# Patient Record
Sex: Female | Born: 1945 | Race: White | Hispanic: No | Marital: Married | Smoking: Never smoker
Health system: Southern US, Community
[De-identification: ages and names within clinical notes are randomized; demographics above are authoritative.]

## PROBLEM LIST (undated history)

## (undated) DIAGNOSIS — G4730 Sleep apnea, unspecified: Secondary | ICD-10-CM

## (undated) DIAGNOSIS — J90 Pleural effusion, not elsewhere classified: Secondary | ICD-10-CM

## (undated) DIAGNOSIS — C801 Malignant (primary) neoplasm, unspecified: Secondary | ICD-10-CM

## (undated) DIAGNOSIS — I341 Nonrheumatic mitral (valve) prolapse: Secondary | ICD-10-CM

## (undated) DIAGNOSIS — I3139 Other pericardial effusion (noninflammatory): Secondary | ICD-10-CM

## (undated) DIAGNOSIS — T4145XA Adverse effect of unspecified anesthetic, initial encounter: Secondary | ICD-10-CM

## (undated) DIAGNOSIS — D649 Anemia, unspecified: Secondary | ICD-10-CM

## (undated) DIAGNOSIS — N390 Urinary tract infection, site not specified: Secondary | ICD-10-CM

## (undated) DIAGNOSIS — M779 Enthesopathy, unspecified: Secondary | ICD-10-CM

## (undated) DIAGNOSIS — R011 Cardiac murmur, unspecified: Secondary | ICD-10-CM

## (undated) DIAGNOSIS — L0390 Cellulitis, unspecified: Secondary | ICD-10-CM

## (undated) DIAGNOSIS — R7303 Prediabetes: Secondary | ICD-10-CM

## (undated) DIAGNOSIS — Z9889 Other specified postprocedural states: Secondary | ICD-10-CM

## (undated) DIAGNOSIS — R112 Nausea with vomiting, unspecified: Secondary | ICD-10-CM

## (undated) DIAGNOSIS — I2699 Other pulmonary embolism without acute cor pulmonale: Secondary | ICD-10-CM

## (undated) DIAGNOSIS — E538 Deficiency of other specified B group vitamins: Secondary | ICD-10-CM

## (undated) DIAGNOSIS — M1990 Unspecified osteoarthritis, unspecified site: Secondary | ICD-10-CM

## (undated) DIAGNOSIS — M1711 Unilateral primary osteoarthritis, right knee: Secondary | ICD-10-CM

## (undated) DIAGNOSIS — I6523 Occlusion and stenosis of bilateral carotid arteries: Secondary | ICD-10-CM

## (undated) DIAGNOSIS — T8859XA Other complications of anesthesia, initial encounter: Secondary | ICD-10-CM

## (undated) DIAGNOSIS — E041 Nontoxic single thyroid nodule: Secondary | ICD-10-CM

## (undated) DIAGNOSIS — I340 Nonrheumatic mitral (valve) insufficiency: Secondary | ICD-10-CM

## (undated) HISTORY — PX: TONSILLECTOMY: SUR1361

## (undated) HISTORY — DX: Other pulmonary embolism without acute cor pulmonale: I26.99

## (undated) HISTORY — PX: FRACTURE SURGERY: SHX138

## (undated) HISTORY — PX: KNEE SURGERY: SHX244

## (undated) HISTORY — PX: APPENDECTOMY: SHX54

## (undated) HISTORY — PX: COLONOSCOPY: SHX174

## (undated) HISTORY — PX: ABDOMINAL HYSTERECTOMY: SHX81

---

## 2015-06-08 ENCOUNTER — Ambulatory Visit
Admission: EM | Admit: 2015-06-08 | Discharge: 2015-06-08 | Disposition: A | Discharge status: Home / Self Care | Payer: Medicare Other | Attending: Family Medicine | Admitting: Family Medicine

## 2015-06-08 DIAGNOSIS — B86 Scabies: Secondary | ICD-10-CM | POA: Diagnosis not present

## 2015-06-08 DIAGNOSIS — R21 Rash and other nonspecific skin eruption: Secondary | ICD-10-CM | POA: Diagnosis not present

## 2015-06-08 MED ORDER — PERMETHRIN 5 % EX CREA: 1.0000 "application " | TOPICAL_CREAM | Freq: Once | CUTANEOUS | Status: DC

## 2015-06-08 MED ORDER — HYDROXYZINE HCL 25 MG PO TABS: 25.0000 mg | ORAL_TABLET | Freq: Three times a day (TID) | ORAL | Status: DC | PRN

## 2015-06-08 NOTE — ED Provider Notes (Signed)
[facility] Emergency Department Provider Note  ____________________________________________  Time seen: Approximately 2:01 PM  I have reviewed the triage vital signs and the nursing notes.   HISTORY  Chief Complaint Rash    HPI Dana Bautista is a 69 y.o. female presents for itchy rash. States present x 4-5 days. States no changes in foods, medications, lotions, detergents or other changes. States she and husband just moved to new home prior to rash onset. 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 States husband now with same. States rash is itchy. Denies pain. States itches worse at night. States rash started on torso and spread. States now even with rash and itching between fingers. Denies seeing insects. Denies seeing bedbugs. States does have dogs.   Denies other changes. Denies facial or inside mouth swelling. Reports continues to eat and drink well.    Past Medical History  Diagnosis Date  . Diabetes mellitus without complication     There are no active problems to display for this patient.   History reviewed. No pertinent past surgical history.  Current Outpatient Rx  Name  Route  Sig  Dispense  Refill  . empagliflozin (JARDIANCE) 10 MG TABS tablet   Oral   Take 10 mg by mouth daily.           Allergies Codeine; Levaquin; Penicillins; and Sulfur  No family history on file.  Social History Social History  Substance Use Topics  . Smoking status: Never Smoker   . Smokeless tobacco: None  . Alcohol Use: No    Review of Systems Constitutional: No fever/chills Eyes: No visual changes. ENT: No sore throat. Cardiovascular: Denies chest pain. Respiratory: Denies shortness of breath. Gastrointestinal: No abdominal pain.  No nausea, no vomiting.  No diarrhea.  No constipation. Genitourinary: Negative for dysuria. Musculoskeletal: Negative for back pain. Skin: positive for rash. Neurological: Negative for headaches, focal weakness or  numbness.  10-point ROS otherwise negative.  ____________________________________________   PHYSICAL EXAM:  VITAL SIGNS: ED Triage Vitals  Enc Vitals Group     BP 06/08/15 1333 120/57 mmHg     Pulse Rate 06/08/15 1333 70     Resp 06/08/15 1333 18     Temp 06/08/15 1333 98.5 F (36.9 C)     Temp Source 06/08/15 1333 Tympanic     SpO2 06/08/15 1333 99 %     Weight 06/08/15 1333 230 lb (104.327 kg)     Height 06/08/15 1333 5\' 6"  (1.676 m)     Head Cir --      Peak Flow --      Pain Score --      Pain Loc --      Pain Edu? --      Excl. in [place]? --     Constitutional: Alert and oriented. Well appearing and in no acute distress. Eyes: Conjunctivae are normal. PERRL. EOMI. Head: Atraumatic.  Nose: No congestion/rhinnorhea.  Mouth/Throat: Mucous membranes are moist.  Oropharynx non-erythematous. No facial or oropharynx swelling.  Neck: No stridor.  No cervical spine tenderness to palpation. Hematological/Lymphatic/Immunilogical: No cervical lymphadenopathy. Cardiovascular: Normal rate, regular rhythm. Grossly normal heart sounds.  Good peripheral circulation. Respiratory: Normal respiratory effort.  No retractions. Lungs CTAB. Gastrointestinal: Soft and nontender. No distention. Normal Bowel sounds.  No abdominal bruits. No CVA tenderness. Musculoskeletal: No lower or upper extremity tenderness nor edema.  No joint effusions. Bilateral pedal pulses equal and easily palpated.  Neurologic:  Normal speech and language. No gross focal neurologic deficits are  appreciated. No gait instability. Skin:  Skin is warm, dry and intact. Generalized pruritic erythematic erythematic base rash with excoriation, worse to torso, not on face, palms of hands or plantar feet. No surrounding erythema, induration or fluctuance. No swelling or edema.  Psychiatric: Mood and affect are normal. Speech and behavior are normal.  ____________________________________________   LABS (all labs ordered are  listed, but only abnormal results are displayed)  Labs Reviewed - No data to display  ____________________________________________   INITIAL IMPRESSION / ASSESSMENT AND PLAN / ED COURSE  Pertinent labs & imaging results that were available during my care of the patient were reviewed by me and considered in my medical decision making (see chart for details).  Presents for generalized pruritic rash with appearance consistent with scabies. Husband with similar. Counseled regarding cleaning and cleaning clothes and bedding. Will treat with oral permethrin and prn hydroxyzine. Discussed follow up and return parameters. Patient verbalized understanding and agreed to plan.  ____________________________________________   FINAL CLINICAL IMPRESSION(S) / ED DIAGNOSES  Final diagnoses:  Scabies  Rash       Marylene Land, NP 06/08/15 1420

## 2015-06-08 NOTE — Discharge Instructions (Signed)
Take medication as prescribed. Avoid scratching.   Follow up with your primary care physician as needed. Return to Urgent care for new or worsening concerns.   Scabies Scabies are small bugs (mites) that burrow under the skin and cause red bumps and severe itching. These bugs can only be seen with a microscope. Scabies are highly contagious. They can spread easily from person to person by direct contact. They are also spread through sharing clothing or linens that have the scabies mites living in them. It is not unusual for an entire family to become infected through shared towels, clothing, or bedding.  HOME CARE INSTRUCTIONS   Your caregiver may prescribe a cream or lotion to kill the mites. If cream is prescribed, massage the cream into the entire body from the neck to the bottom of both feet. Also massage the cream into the scalp and face if your child is less than 79 year old. Avoid the eyes and mouth. Do not wash your hands after application.  Leave the cream on for 8 to 12 hours. Your child should bathe or shower after the 8 to 12 hour application period. Sometimes it is helpful to apply the cream to your child right before bedtime.  One treatment is usually effective and will eliminate approximately 95% of infestations. For severe cases, your caregiver may decide to repeat the treatment in 1 week. Everyone in your household should be treated with one application of the cream.  New rashes or burrows should not appear within 24 to 48 hours after successful treatment. However, the itching and rash may last for 2 to 4 weeks after successful treatment. Your caregiver may prescribe a medicine to help with the itching or to help the rash go away more quickly.  Scabies can live on clothing or linens for up to 3 days. All of your child's recently used clothing, towels, stuffed toys, and bed linens should be washed in hot water and then dried in a dryer for at least 20 minutes on high heat. Items that  cannot be washed should be enclosed in a plastic bag for at least 3 days.  To help relieve itching, bathe your child in a cool bath or apply cool washcloths to the affected areas.  Your child may return to school after treatment with the prescribed cream. SEEK MEDICAL CARE IF:   The itching persists longer than 4 weeks after treatment.  The rash spreads or becomes infected. Signs of infection include red blisters or yellow-tan crust. Document Released: 09/12/2005 Document Revised: 12/05/2011 Document Reviewed: 01/21/2009 [facility] Patient Information 2015 [place], [place]. This information is not intended to replace advice given to you by your health care provider. Make sure you discuss any questions you have with your health care provider.

## 2015-06-08 NOTE — ED Notes (Signed)
Pt states "I have a rash all over my body, we are moving and I am unsure if we got bedbugs from the moving company. My husband has the rash coming up as well."

## 2015-06-25 ENCOUNTER — Ambulatory Visit
Admission: EM | Admit: 2015-06-25 | Discharge: 2015-06-25 | Disposition: A | Discharge status: Home / Self Care | Payer: Medicare Other | Attending: Family Medicine | Admitting: Family Medicine

## 2015-06-25 DIAGNOSIS — R300 Dysuria: Secondary | ICD-10-CM | POA: Diagnosis present

## 2015-06-25 DIAGNOSIS — E119 Type 2 diabetes mellitus without complications: Secondary | ICD-10-CM | POA: Insufficient documentation

## 2015-06-25 DIAGNOSIS — Z79899 Other long term (current) drug therapy: Secondary | ICD-10-CM | POA: Insufficient documentation

## 2015-06-25 DIAGNOSIS — N390 Urinary tract infection, site not specified: Secondary | ICD-10-CM

## 2015-06-25 DIAGNOSIS — R51 Headache: Secondary | ICD-10-CM | POA: Diagnosis present

## 2015-06-25 DIAGNOSIS — B852 Pediculosis, unspecified: Secondary | ICD-10-CM

## 2015-06-25 LAB — URINALYSIS COMPLETE WITH MICROSCOPIC (ARMC ONLY)
BILIRUBIN URINE: NEGATIVE
Glucose, UA: 500 mg/dL — AB
Ketones, ur: NEGATIVE mg/dL
NITRITE: POSITIVE — AB
PH: 5.5 (ref 5.0–8.0)
Protein, ur: NEGATIVE mg/dL
SPECIFIC GRAVITY, URINE: 1.015 (ref 1.005–1.030)

## 2015-06-25 MED ORDER — FLUCONAZOLE 150 MG PO TABS: 150.0000 mg | ORAL_TABLET | Freq: Once | ORAL | Status: DC

## 2015-06-25 MED ORDER — RANITIDINE HCL 150 MG PO CAPS: 150.0000 mg | ORAL_CAPSULE | Freq: Two times a day (BID) | ORAL | Status: DC

## 2015-06-25 MED ORDER — PHENAZOPYRIDINE HCL 200 MG PO TABS: 200.0000 mg | ORAL_TABLET | Freq: Three times a day (TID) | ORAL | Status: DC | PRN

## 2015-06-25 MED ORDER — PERMETHRIN 5 % EX CREA: 1.0000 "application " | TOPICAL_CREAM | Freq: Once | CUTANEOUS | Status: DC

## 2015-06-25 MED ORDER — LORATADINE 10 MG PO TABS: 10.0000 mg | ORAL_TABLET | Freq: Every day | ORAL | Status: DC

## 2015-06-25 MED ORDER — CIPROFLOXACIN HCL 500 MG PO TABS: 500.0000 mg | ORAL_TABLET | Freq: Two times a day (BID) | ORAL | Status: DC

## 2015-06-25 NOTE — ED Provider Notes (Signed)
CSN: [id number]     Arrival date & time 06/25/15  1658 History   First MD Initiated Contact with Patient 06/25/15 1804     Chief Complaint  Patient presents with  . Urinary Tract Infection  . Rash   problem 1 UTI: she reports burning on urination frequency she's had some recurrent urinary tract infections but not lately. She is found.hydration is a helps but she is recently moved. She states that initially for UTI she took some clindamycin but after Labor Day that seemed to help but then she has to stop after 3 days of clindamycin gave a yeast infection and the UTI symptoms have returned. Reports low abdominal pain as well.    problem 2 pediculosis infection: She was seen here a few weeks ago by nurse practitioner Sabra Heck. She was given the Elimite lotion but apparently did not convince her husband to take the lotion like she did. He is said child to use it on a when necessary basis and where he had lesions. Also as she points out she's not a small woman and that she didn't have enough lotion for her and her husband. Expect I will give a new prescription for the lotion that her husband is seen and he is here to be seen.    (Consider location/radiation/quality/duration/timing/severity/associated sxs/prior Treatment) Patient is a 69 y.o. female presenting with urinary tract infection and rash. The history is provided by the patient. No language interpreter was used.  Urinary Tract Infection Pain quality:  Sharp, burning and shooting Pain severity:  Moderate Duration:  3 weeks Timing:  Constant (After about for 5 days she took 3 days of clindamycin she had left over from some dental work. That seemed to help but was only 3 days and since then is come back. She states is to be some infection as clindamycin) Progression:  Worsening Chronicity:  New Relieved by:  Nothing Ineffective treatments:  Antibiotics (Clindamycin) Urinary symptoms: frequent urination   Associated symptoms: abdominal pain     Associated symptoms: no fever and no flank pain   Risk factors: recurrent urinary tract infections   Rash Location:  Head/neck and shoulder/arm Head/neck rash location:  L neck and R neck Shoulder/arm rash location:  L arm and R arm Quality: itchiness   Severity:  Moderate Timing:  Constant Progression:  Waxing and waning Chronicity:  Recurrent Relieved by:  Antihistamines Associated symptoms: abdominal pain   Associated symptoms: no fever     Past Medical History  Diagnosis Date  . Diabetes mellitus without complication    No past surgical history on file. No family history on file. Social History  Substance Use Topics  . Smoking status: Never Smoker   . Smokeless tobacco: Never Used  . Alcohol Use: No   OB History    No data available     Review of Systems  Constitutional: Negative for fever.  Gastrointestinal: Positive for abdominal pain.  Genitourinary: Positive for dysuria, frequency and decreased urine volume. Negative for flank pain.  Skin: Positive for rash.    Allergies  Codeine; Levaquin; Penicillins; and Sulfur  Home Medications   Prior to Admission medications   Medication Sig Start Date End Date Taking? Authorizing Provider  empagliflozin (JARDIANCE) 10 MG TABS tablet Take 10 mg by mouth daily.   Yes Historical Provider, MD  hydrOXYzine (ATARAX/VISTARIL) 25 MG tablet Take 1 tablet (25 mg total) by mouth 3 (three) times daily as needed for itching. 06/08/15  Yes Marylene Land, NP  permethrin Nancy Fetter)  5 % cream Apply 1 application topically once. Apply to entire body topically once and leave on for 8-14 hours then wash off. Repeat in 1 week. Rx: 2 doses 06/08/15  Yes Marylene Land, NP  VITAMIN D, CHOLECALCIFEROL, PO Take by mouth.   Yes Historical Provider, MD   Meds Ordered and Administered this Visit  Medications - No data to display  BP 98/77 mmHg  Pulse 72  Temp(Src) 97.9 F (36.6 C) (Tympanic)  Resp 16  Ht 5' 6.5" (1.689 m)  Wt 220 lb  (99.791 kg)  BMI 34.98 kg/m2  SpO2 96% No data found.   Physical Exam  Constitutional: She is oriented to person, place, and time. She appears well-developed and well-nourished.  Obese white female  HENT:  Head: Normocephalic and atraumatic.  Eyes: Pupils are equal, round, and reactive to light.  Abdominal: Soft. Bowel sounds are normal. She exhibits no distension.  Tenderness suprapubically  Musculoskeletal: Normal range of motion. She exhibits no edema.  Neurological: She is alert and oriented to person, place, and time.  Skin: Rash noted.     Patient was pearly covered with a rash before she used the Elimite lotion. However she states her husband is only been dabbing the lotion on him know his lesion which is typed explained to her that I can be the way to keep infections from occurring.  Psychiatric: She has a normal mood and affect. Her behavior is normal.  Vitals reviewed.   ED Course  Procedures (including critical care time)  Labs Review Labs Reviewed  URINALYSIS COMPLETEWITH MICROSCOPIC ([facility] ONLY) - Abnormal; Notable for the following:    APPearance HAZY (*)    Glucose, UA 500 (*)    Hgb urine dipstick 1+ (*)    Nitrite POSITIVE (*)    Leukocytes, UA 1+ (*)    Bacteria, UA MANY (*)    Squamous Epithelial / LPF 0-5 (*)    All other components within normal limits  URINE CULTURE    Imaging Review No results found.   Visual Acuity Review  Right Eye Distance:   Left Eye Distance:   Bilateral Distance:    Right Eye Near:   Left Eye Near:    Bilateral Near:         MDM  No diagnosis found.    #1 UTI: We'll place on Cipro for at least a week, she states that she is allergic to Levaquin and multiple anabiotic's but she can't take Cipro, Pyridium 200 mg 3 times a day for the bladder discomfort, and Diflucan to prevent yeast infections follow-up with her PCP of her choice in 2-3 weeks to prove care  #2 patient is here to recheck her pediculosis  infection. I have personally gone over the hygienic way of using the Elimite lotion. Washing bed linen washing her clothes placing it from the neck down and stressed to her that she has to have a husband treat same time and do the same hygienic purposes otherwise is not going to work. The clothes she cannot wash she is not wear for 2-3 days. We'll place on Zantac twice a day and Claritin 10 mg for itching.   Frederich Cha, MD 06/25/15 [phone]

## 2015-06-25 NOTE — ED Notes (Signed)
Pt was seen here about 2 weeks ago for scabies. Pt reports she is 50% improved, but not resolved. Pt also c/o dysuria. Pt has a h/o recurrent UTI's.

## 2015-06-25 NOTE — Discharge Instructions (Signed)
Head and Pubic Lice Lice are tiny, light brown insects with claws on the ends of their legs. They are small parasites that live on the human body. Lice often make their home in your hair. They hatch from little round eggs (nits), which are attached to the base of hairs. They spread by:  Direct contact with an infested person.  Infested personal items such as combs, brushes, towels, clothing, pillow cases and sheets. The parasite that causes your condition may also live in clothes which have been worn within the week before treatment. Therefore, it is necessary to wash your clothes, bed linens, towels, combs and brushes. Any woolens can be put in an air-tight plastic bag for one week. You need to use fresh clothes, towels and sheets after your treatment is completed. Re-treatment is usually not necessary if instructions are followed. If necessary, treatment may be repeated in 7 days. The entire family may require treatment. Sexual partners should be treated if the nits are present in the pubic area. TREATMENT  Apply enough medicated shampoo or cream to wet hair and skin in and around the infected areas.  Work thoroughly into hair and leave in according to instructions.  Add a small amount of water until a good lather forms.  Rinse thoroughly.  Towel briskly.  When hair is dry, any remaining nits, cream or shampoo may be removed with a fine-tooth comb or tweezers. The nits resemble dandruff; however they are glued to the hair follicle and are difficult to brush out. Frequent fine combing and shampoos are necessary. A towel soaked in white vinegar and left on the hair for 2 hours will also help soften the glue which holds the nits on the hair. Medicated shampoo or cream should not be used on children or pregnant women without a caregiver's prescription or instructions. SEEK MEDICAL CARE IF:   You or your child develops sores that look infected.  The rash does not go away in one week.  The  lice or nits return or persist in spite of treatment. Document Released: 09/12/2005 Document Revised: 12/05/2011 Document Reviewed: 04/11/2007 [facility] Patient Information 2015 [place], [place]. This information is not intended to replace advice given to you by your health care provider. Make sure you discuss any questions you have with your health care provider.  Urinary Tract Infection A urinary tract infection (UTI) can occur any place along the urinary tract. The tract includes the kidneys, ureters, bladder, and urethra. A type of germ called bacteria often causes a UTI. UTIs are often helped with antibiotic medicine.  HOME CARE   If given, take antibiotics as told by your doctor. Finish them even if you start to feel better.  Drink enough fluids to keep your pee (urine) clear or pale yellow.  Avoid tea, drinks with caffeine, and bubbly (carbonated) drinks.  Pee often. Avoid holding your pee in for a long time.  Pee before and after having sex (intercourse).  Wipe from front to back after you poop (bowel movement) if you are a woman. Use each tissue only once. GET HELP RIGHT AWAY IF:   You have back pain.  You have lower belly (abdominal) pain.  You have chills.  You feel sick to your stomach (nauseous).  You throw up (vomit).  Your burning or discomfort with peeing does not go away.  You have a fever.  Your symptoms are not better in 3 days. MAKE SURE YOU:   Understand these instructions.  Will watch your condition.  Will  get help right away if you are not doing well or get worse. Document Released: 02/29/2008 Document Revised: 06/06/2012 Document Reviewed: 04/12/2012 [facility] Patient Information 2015 [place], [place]. This information is not intended to replace advice given to you by your health care provider. Make sure you discuss any questions you have with your health care provider.

## 2015-06-27 LAB — URINE CULTURE: Culture: >=100000

## 2015-08-26 ENCOUNTER — Emergency Department: Payer: Medicare Other

## 2015-08-26 ENCOUNTER — Emergency Department
Admission: EM | Admit: 2015-08-26 | Discharge: 2015-08-26 | Disposition: A | Discharge status: Home / Self Care | Payer: Medicare Other | Attending: Emergency Medicine | Admitting: Emergency Medicine

## 2015-08-26 ENCOUNTER — Encounter: Payer: Self-pay | Admitting: Emergency Medicine

## 2015-08-26 DIAGNOSIS — E119 Type 2 diabetes mellitus without complications: Secondary | ICD-10-CM | POA: Insufficient documentation

## 2015-08-26 DIAGNOSIS — R079 Chest pain, unspecified: Secondary | ICD-10-CM | POA: Insufficient documentation

## 2015-08-26 DIAGNOSIS — Z880 Allergy status to penicillin: Secondary | ICD-10-CM | POA: Diagnosis not present

## 2015-08-26 HISTORY — DX: Nonrheumatic mitral (valve) insufficiency: I34.0

## 2015-08-26 LAB — BASIC METABOLIC PANEL
Anion gap: 7 (ref 5–15)
BUN: 19 mg/dL (ref 6–20)
CHLORIDE: 105 mmol/L (ref 101–111)
CO2: 28 mmol/L (ref 22–32)
Calcium: 9.4 mg/dL (ref 8.9–10.3)
Creatinine, Ser: 0.96 mg/dL (ref 0.44–1.00)
GFR calc non Af Amer: >60 mL/min (ref >60)
Glucose, Bld: 100 mg/dL — ABNORMAL HIGH (ref 65–99)
POTASSIUM: 3.8 mmol/L (ref 3.5–5.1)
SODIUM: 140 mmol/L (ref 135–145)

## 2015-08-26 LAB — CBC
HEMATOCRIT: 36.8 % (ref 35.0–47.0)
Hemoglobin: 12 g/dL (ref 12.0–16.0)
MCH: 27.3 pg (ref 26.0–34.0)
MCHC: 32.5 g/dL (ref 32.0–36.0)
MCV: 84 fL (ref 80.0–100.0)
Platelets: 271 10*3/uL (ref 150–440)
RBC: 4.39 MIL/uL (ref 3.80–5.20)
RDW: 14.4 % (ref 11.5–14.5)
WBC: 8.5 10*3/uL (ref 3.6–11.0)

## 2015-08-26 LAB — TROPONIN I: Troponin I: <0.03 ng/mL (ref <0.031)

## 2015-08-26 MED ORDER — ASPIRIN 81 MG PO CHEW: 81.0000 mg | CHEWABLE_TABLET | Freq: Every day | ORAL | Status: AC

## 2015-08-26 MED ORDER — ASPIRIN 81 MG PO CHEW
324.0000 mg | CHEWABLE_TABLET | Freq: Once | ORAL | Status: AC
  Administered 2015-08-26: 324 mg via ORAL
  Filled 2015-08-26: qty 4

## 2015-08-26 NOTE — Discharge Instructions (Signed)
Nonspecific Chest Pain  °Chest pain can be caused by many different conditions. There is always a chance that your pain could be related to something serious, such as a heart attack or a blood clot in your lungs. Chest pain can also be caused by conditions that are not life-threatening. If you have chest pain, it is very important to follow up with your health care provider. °CAUSES  °Chest pain can be caused by: °· Heartburn. °· Pneumonia or bronchitis. °· Anxiety or stress. °· Inflammation around your heart (pericarditis) or lung (pleuritis or pleurisy). °· A blood clot in your lung. °· A collapsed lung (pneumothorax). It can develop suddenly on its own (spontaneous pneumothorax) or from trauma to the chest. °· Shingles infection (varicella-zoster virus). °· Heart attack. °· Damage to the bones, muscles, and cartilage that make up your chest wall. This can include: °¨ Bruised bones due to injury. °¨ Strained muscles or cartilage due to frequent or repeated coughing or overwork. °¨ Fracture to one or more ribs. °¨ Sore cartilage due to inflammation (costochondritis). °RISK FACTORS  °Risk factors for chest pain may include: °· Activities that increase your risk for trauma or injury to your chest. °· Respiratory infections or conditions that cause frequent coughing. °· Medical conditions or overeating that can cause heartburn. °· Heart disease or family history of heart disease. °· Conditions or health behaviors that increase your risk of developing a blood clot. °· Having had chicken pox (varicella zoster). °SIGNS AND SYMPTOMS °Chest pain can feel like: °· Burning or tingling on the surface of your chest or deep in your chest. °· Crushing, pressure, aching, or squeezing pain. °· Dull or sharp pain that is worse when you move, cough, or take a deep breath. °· Pain that is also felt in your back, neck, shoulder, or arm, or pain that spreads to any of these areas. °Your chest pain may come and go, or it may stay  constant. °DIAGNOSIS °Lab tests or other studies may be needed to find the cause of your pain. Your health care provider may have you take a test called an ambulatory ECG (electrocardiogram). An ECG records your heartbeat patterns at the time the test is performed. You may also have other tests, such as: °· Transthoracic echocardiogram (TTE). During echocardiography, sound waves are used to create a picture of all of the heart structures and to look at how blood flows through your heart. °· Transesophageal echocardiogram (TEE). This is a more advanced imaging test that obtains images from inside your body. It allows your health care provider to see your heart in finer detail. °· Cardiac monitoring. This allows your health care provider to monitor your heart rate and rhythm in real time. °· Holter monitor. This is a portable device that records your heartbeat and can help to diagnose abnormal heartbeats. It allows your health care provider to track your heart activity for several days, if needed. °· Stress tests. These can be done through exercise or by taking medicine that makes your heart beat more quickly. °· Blood tests. °· Imaging tests. °TREATMENT  °Your treatment depends on what is causing your chest pain. Treatment may include: °· Medicines. These may include: °¨ Acid blockers for heartburn. °¨ Anti-inflammatory medicine. °¨ Pain medicine for inflammatory conditions. °¨ Antibiotic medicine, if an infection is present. °¨ Medicines to dissolve blood clots. °¨ Medicines to treat coronary artery disease. °· Supportive care for conditions that do not require medicines. This may include: °¨ Resting. °¨ Applying heat   or cold packs to injured areas. °¨ Limiting activities until pain decreases. °HOME CARE INSTRUCTIONS °· If you were prescribed an antibiotic medicine, finish it all even if you start to feel better. °· Avoid any activities that bring on chest pain. °· Do not use any tobacco products, including  cigarettes, chewing tobacco, or electronic cigarettes. If you need help quitting, ask your health care provider. °· Do not drink alcohol. °· Take medicines only as directed by your health care provider. °· Keep all follow-up visits as directed by your health care provider. This is important. This includes any further testing if your chest pain does not go away. °· If heartburn is the cause for your chest pain, you may be told to keep your head raised (elevated) while sleeping. This reduces the chance that acid will go from your stomach into your esophagus. °· Make lifestyle changes as directed by your health care provider. These may include: °¨ Getting regular exercise. Ask your health care provider to suggest some activities that are safe for you. °¨ Eating a heart-healthy diet. A registered dietitian can help you to learn healthy eating options. °¨ Maintaining a healthy weight. °¨ Managing diabetes, if necessary. °¨ Reducing stress. °SEEK MEDICAL CARE IF: °· Your chest pain does not go away after treatment. °· You have a rash with blisters on your chest. °· You have a fever. °SEEK IMMEDIATE MEDICAL CARE IF:  °· Your chest pain is worse. °· You have an increasing cough, or you cough up blood. °· You have severe abdominal pain. °· You have severe weakness. °· You faint. °· You have chills. °· You have sudden, unexplained chest discomfort. °· You have sudden, unexplained discomfort in your arms, back, neck, or jaw. °· You have shortness of breath at any time. °· You suddenly start to sweat, or your skin gets clammy. °· You feel nauseous or you vomit. °· You suddenly feel light-headed or dizzy. °· Your heart begins to beat quickly, or it feels like it is skipping beats. °These symptoms may represent a serious problem that is an emergency. Do not wait to see if the symptoms will go away. Get medical help right away. Call your local emergency services (911 in the [place] Do not drive yourself to the hospital. °  °This  information is not intended to replace advice given to you by your health care provider. Make sure you discuss any questions you have with your health care provider. °  °Document Released: 06/22/2005 Document Revised: 10/03/2014 Document Reviewed: 04/18/2014 °Elsevier Interactive Patient Education ©2016 [employer] ° °

## 2015-08-26 NOTE — ED Provider Notes (Signed)
[facility] Emergency Department Provider Note     Time seen: ----------------------------------------- 10:13 PM on 08/26/2015 -----------------------------------------    I have reviewed the triage vital signs and the nursing notes.   HISTORY  Chief Complaint Chest Pain    HPI Dana Bautista is a 69 y.o. female who presents to ER for sudden onset chest pain and watching TV tonight. Patient states it radiated into her left arm, describes as pressure, as never had this before. She is not having any risk factors for coronary artery disease other than a family history. Patient does not smoke, denies any other symptoms associated with pain such as shortness of breath or nausea.   Past Medical History  Diagnosis Date  . Diabetes mellitus without complication ([place])   . Mitral valve regurgitation     There are no active problems to display for this patient.   Past Surgical History  Procedure Laterality Date  . Appendectomy    . Tonsillectomy      Allergies Codeine; Levaquin; Penicillins; and Sulfur  Social History Social History  Substance Use Topics  . Smoking status: Never Smoker   . Smokeless tobacco: Never Used  . Alcohol Use: No    Review of Systems Constitutional: Negative for fever. Eyes: Negative for visual changes. ENT: Negative for sore throat. Cardiovascular: Positive for chest pain Respiratory: Negative for shortness of breath. Gastrointestinal: Negative for abdominal pain, vomiting and diarrhea. Genitourinary: Negative for dysuria. Musculoskeletal: Negative for back pain. Skin: Negative for rash. Neurological: Negative for headaches, focal weakness or numbness.  10-point ROS otherwise negative.  ____________________________________________   PHYSICAL EXAM:  VITAL SIGNS: ED Triage Vitals  Enc Vitals Group     BP 08/26/15 1931 142/62 mmHg     Pulse Rate 08/26/15 1931 93     Resp 08/26/15 1931 20     Temp 08/26/15 1931  97.8 F (36.6 C)     Temp Source 08/26/15 1931 Oral     SpO2 08/26/15 1931 99 %     Weight 08/26/15 1931 200 lb (90.719 kg)     Height 08/26/15 1931 5\' 6"  (1.676 m)     Head Cir --      Peak Flow --      Pain Score 08/26/15 1928 5     Pain Loc --      Pain Edu? --      Excl. in [place]? --     Constitutional: Alert and oriented. Well appearing and in no distress. Eyes: Conjunctivae are normal. PERRL. Normal extraocular movements. ENT   Head: Normocephalic and atraumatic.   Nose: No congestion/rhinnorhea.   Mouth/Throat: Mucous membranes are moist.   Neck: No stridor. Cardiovascular: Normal rate, regular rhythm. Normal and symmetric distal pulses are present in all extremities. No murmurs, rubs, or gallops. Respiratory: Normal respiratory effort without tachypnea nor retractions. Breath sounds are clear and equal bilaterally. No wheezes/rales/rhonchi. Gastrointestinal: Soft and nontender. No distention. No abdominal bruits.  Musculoskeletal: Nontender with normal range of motion in all extremities. No joint effusions.  No lower extremity tenderness nor edema. Neurologic:  Normal speech and language. No gross focal neurologic deficits are appreciated. Speech is normal. No gait instability. Skin:  Skin is warm, dry and intact. No rash noted. Psychiatric: Mood and affect are normal. Speech and behavior are normal. Patient exhibits appropriate insight and judgment. ____________________________________________  EKG: Interpreted by me. Normal sinus rhythm with rate 89 bpm, left axis deviation, mild voltage criteria for LVH, normal ST and T wave  segments. No evidence of acute infarction.  ____________________________________________  ED COURSE:  Pertinent labs & imaging results that were available during my care of the patient were reviewed by me and considered in my medical decision making (see chart for details). Patient is no distress, will check cardiac labs and  reevaluate. ____________________________________________    LABS (pertinent positives/negatives)  Labs Reviewed  BASIC METABOLIC PANEL - Abnormal; Notable for the following:    Glucose, Bld 100 (*)    All other components within normal limits  CBC  TROPONIN I    RADIOLOGY  Chest x-ray is normal  ____________________________________________  FINAL ASSESSMENT AND PLAN  Chest pain  Plan: Patient with labs and imaging as dictated above. Patient with a heart score 3, low risk for ACS. She was given 4 baby aspirin here. She'll be started on baby aspirin until she can follow-up with cardiology. I advised her ideally I would like to take another troponin, she states she can't stay. She'll be referred to cardiology in the next 1-2 days.   Earleen Newport, MD   Earleen Newport, MD 08/26/15 [phone]

## 2015-08-26 NOTE — ED Notes (Signed)
Pt to triage via w/c with no distress noted; pt reports left sided CP radiating into left arm that began just PTA; denies hx of same

## 2015-11-02 ENCOUNTER — Ambulatory Visit
Admission: EM | Admit: 2015-11-02 | Discharge: 2015-11-02 | Disposition: A | Discharge status: Home / Self Care | Payer: Medicare Other | Attending: Family Medicine | Admitting: Family Medicine

## 2015-11-02 DIAGNOSIS — S0501XA Injury of conjunctiva and corneal abrasion without foreign body, right eye, initial encounter: Secondary | ICD-10-CM | POA: Diagnosis not present

## 2015-11-02 HISTORY — DX: Urinary tract infection, site not specified: N39.0

## 2015-11-02 MED ORDER — TETRACAINE HCL 0.5 % OP SOLN
2.0000 [drp] | Freq: Once | OPHTHALMIC | Status: AC
  Administered 2015-11-02: 2 [drp] via OPHTHALMIC

## 2015-11-02 MED ORDER — ERYTHROMYCIN 5 MG/GM OP OINT: TOPICAL_OINTMENT | OPHTHALMIC | Status: DC

## 2015-11-02 NOTE — ED Notes (Signed)
States outside [place]  This afternoon and felt something go into right eye. Flushed eye "half a dozen times". Very painful and vision blurred

## 2015-11-02 NOTE — ED Provider Notes (Signed)
CSN: [id number]     Arrival date & time 11/02/15  1726 History   First MD Initiated Contact with Patient 11/02/15 1910     Chief Complaint  Patient presents with  . Eye Injury   (Consider location/radiation/quality/duration/timing/severity/associated sxs/prior Treatment) HPI Comments: 70 yo female presents with a c/o right eye foreign body sensation and discomfort. States was outside [place] when she felt something go into right eye. Went inside her house and flushed her eye.   The history is provided by the patient.    Past Medical History  Diagnosis Date  . Diabetes mellitus without complication ([place])   . Mitral valve regurgitation   . UTI (lower urinary tract infection)    Past Surgical History  Procedure Laterality Date  . Appendectomy    . Tonsillectomy     History reviewed. No pertinent family history. Social History  Substance Use Topics  . Smoking status: Never Smoker   . Smokeless tobacco: Never Used  . Alcohol Use: No   OB History    No data available     Review of Systems  Allergies  Codeine; Levaquin; Penicillins; and Sulfur  Home Medications   Prior to Admission medications   Medication Sig Start Date End Date Taking? Authorizing Provider  VITAMIN D, CHOLECALCIFEROL, PO Take by mouth.   Yes Historical Provider, MD  aspirin (ASPIRIN CHILDRENS) 81 MG chewable tablet Chew 1 tablet (81 mg total) by mouth daily. 08/26/15 08/25/16  Earleen Newport, MD  ciprofloxacin (CIPRO) 500 MG tablet Take 1 tablet (500 mg total) by mouth 2 (two) times daily. 06/25/15   Frederich Cha, MD  empagliflozin (JARDIANCE) 10 MG TABS tablet Take 10 mg by mouth daily.    Historical Provider, MD  erythromycin ophthalmic ointment Place a 1/2 inch ribbon of ointment into the lower eyelid qid x 7 days 11/02/15   Norval Gable, MD  fluconazole (DIFLUCAN) 150 MG tablet Take 1 tablet (150 mg total) by mouth once. 06/25/15   Frederich Cha, MD  hydrOXYzine (ATARAX/VISTARIL) 25 MG tablet Take  1 tablet (25 mg total) by mouth 3 (three) times daily as needed for itching. 06/08/15   Marylene Land, NP  loratadine (CLARITIN) 10 MG tablet Take 1 tablet (10 mg total) by mouth daily. Take 1 tablet in the morning. As needed for itching. 06/25/15   Frederich Cha, MD  permethrin (ELIMITE) 5 % cream Apply 1 application topically once. Most apply as directed must follow instructions as discussed. Apply neck down after warm shower follow hygienic instructions as well. Treat anyone who shares the bedroom bedroom as well. This cannot be uses a lotion the must be applied as directed 06/25/15   Frederich Cha, MD  phenazopyridine (PYRIDIUM) 200 MG tablet Take 1 tablet (200 mg total) by mouth 3 (three) times daily as needed for pain. 06/25/15   Frederich Cha, MD  ranitidine (ZANTAC) 150 MG capsule Take 1 capsule (150 mg total) by mouth 2 (two) times daily. 06/25/15   Frederich Cha, MD   Meds Ordered and Administered this Visit   Medications  tetracaine (PONTOCAINE) 0.5 % ophthalmic solution 2 drop (2 drops Right Eye Given 11/02/15 1909)    BP 107/72 mmHg  Pulse 76  Temp(Src) 96.7 F (35.9 C) (Tympanic)  Resp 20  Ht 5' 6.5" (1.689 m)  Wt 210 lb (95.255 kg)  BMI 33.39 kg/m2  SpO2 100% No data found.   Physical Exam  Constitutional: She appears well-developed and well-nourished. No distress.  Eyes: Conjunctivae and EOM  are normal. Pupils are equal, round, and reactive to light. Lids are everted and swept, no foreign bodies found. Right eye exhibits no discharge. Left eye exhibits no discharge. No scleral icterus.  Slit lamp exam:      The right eye shows fluorescein uptake. The right eye shows no anterior chamber bulge.       The left eye shows no fluorescein uptake and no anterior chamber bulge.    Skin: She is not diaphoretic.  Nursing note and vitals reviewed.   ED Course  Procedures (including critical care time)  Labs Review Labs Reviewed - No data to display  Imaging Review No results  found.   Visual Acuity Review  Right Eye Distance: 20/30 Left Eye Distance: 20/20 Bilateral Distance:    Right Eye Near:   Left Eye Near:    Bilateral Near:         MDM   1. Corneal abrasion, right, initial encounter    Discharge Medication List as of 11/02/2015  7:35 PM    START taking these medications   Details  erythromycin ophthalmic ointment Place a 1/2 inch ribbon of ointment into the lower eyelid qid x 7 days, Normal       1. diagnosis reviewed with patient 2. rx as per orders above; reviewed possible side effects, interactions, risks and benefits  3. Recommend supportive treatment with cool compresses to eyelids prn 4. Follow-up prn if symptoms worsen or don't improve    Norval Gable, MD 11/02/15 [phone]

## 2015-12-16 DIAGNOSIS — M545 Low back pain, unspecified: Secondary | ICD-10-CM | POA: Insufficient documentation

## 2015-12-16 DIAGNOSIS — R7303 Prediabetes: Secondary | ICD-10-CM | POA: Insufficient documentation

## 2015-12-16 DIAGNOSIS — N3941 Urge incontinence: Secondary | ICD-10-CM | POA: Insufficient documentation

## 2015-12-16 DIAGNOSIS — G8929 Other chronic pain: Secondary | ICD-10-CM | POA: Insufficient documentation

## 2015-12-16 DIAGNOSIS — G5711 Meralgia paresthetica, right lower limb: Secondary | ICD-10-CM | POA: Insufficient documentation

## 2015-12-21 DIAGNOSIS — E559 Vitamin D deficiency, unspecified: Secondary | ICD-10-CM | POA: Insufficient documentation

## 2016-05-08 ENCOUNTER — Emergency Department: Payer: Medicare Other

## 2016-05-08 ENCOUNTER — Emergency Department
Admission: EM | Admit: 2016-05-08 | Discharge: 2016-05-08 | Disposition: A | Discharge status: Home / Self Care | Payer: Medicare Other | Attending: Emergency Medicine | Admitting: Emergency Medicine

## 2016-05-08 ENCOUNTER — Encounter: Payer: Self-pay | Admitting: Emergency Medicine

## 2016-05-08 DIAGNOSIS — Z7982 Long term (current) use of aspirin: Secondary | ICD-10-CM | POA: Diagnosis not present

## 2016-05-08 DIAGNOSIS — Y939 Activity, unspecified: Secondary | ICD-10-CM | POA: Insufficient documentation

## 2016-05-08 DIAGNOSIS — S8001XA Contusion of right knee, initial encounter: Secondary | ICD-10-CM | POA: Insufficient documentation

## 2016-05-08 DIAGNOSIS — E119 Type 2 diabetes mellitus without complications: Secondary | ICD-10-CM | POA: Insufficient documentation

## 2016-05-08 DIAGNOSIS — S060X0A Concussion without loss of consciousness, initial encounter: Secondary | ICD-10-CM | POA: Insufficient documentation

## 2016-05-08 DIAGNOSIS — M62838 Other muscle spasm: Secondary | ICD-10-CM | POA: Insufficient documentation

## 2016-05-08 DIAGNOSIS — S161XXA Strain of muscle, fascia and tendon at neck level, initial encounter: Secondary | ICD-10-CM | POA: Insufficient documentation

## 2016-05-08 DIAGNOSIS — Y999 Unspecified external cause status: Secondary | ICD-10-CM | POA: Insufficient documentation

## 2016-05-08 DIAGNOSIS — Y92410 Unspecified street and highway as the place of occurrence of the external cause: Secondary | ICD-10-CM | POA: Insufficient documentation

## 2016-05-08 DIAGNOSIS — M542 Cervicalgia: Secondary | ICD-10-CM | POA: Diagnosis present

## 2016-05-08 MED ORDER — ONDANSETRON 4 MG PO TBDP: 4.0000 mg | ORAL_TABLET | Freq: Three times a day (TID) | ORAL | 0 refills | Status: DC | PRN

## 2016-05-08 MED ORDER — DIAZEPAM 2 MG PO TABS: 2.0000 mg | ORAL_TABLET | Freq: Two times a day (BID) | ORAL | 0 refills | Status: AC | PRN

## 2016-05-08 MED ORDER — ONDANSETRON 4 MG PO TBDP
4.0000 mg | ORAL_TABLET | Freq: Once | ORAL | Status: AC
  Administered 2016-05-08: 4 mg via ORAL
  Filled 2016-05-08: qty 1

## 2016-05-08 NOTE — Discharge Instructions (Signed)
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
## 2016-06-13 DIAGNOSIS — R42 Dizziness and giddiness: Secondary | ICD-10-CM | POA: Insufficient documentation

## 2016-06-13 DIAGNOSIS — S161XXA Strain of muscle, fascia and tendon at neck level, initial encounter: Secondary | ICD-10-CM | POA: Insufficient documentation

## 2016-06-13 DIAGNOSIS — R51 Headache: Secondary | ICD-10-CM

## 2016-06-13 DIAGNOSIS — R519 Headache, unspecified: Secondary | ICD-10-CM | POA: Insufficient documentation

## 2016-06-13 DIAGNOSIS — Z8679 Personal history of other diseases of the circulatory system: Secondary | ICD-10-CM | POA: Insufficient documentation

## 2016-10-06 DIAGNOSIS — I6523 Occlusion and stenosis of bilateral carotid arteries: Secondary | ICD-10-CM | POA: Insufficient documentation

## 2016-11-20 DIAGNOSIS — R110 Nausea: Secondary | ICD-10-CM | POA: Insufficient documentation

## 2016-11-20 DIAGNOSIS — R079 Chest pain, unspecified: Secondary | ICD-10-CM | POA: Insufficient documentation

## 2016-11-20 DIAGNOSIS — H6121 Impacted cerumen, right ear: Secondary | ICD-10-CM | POA: Insufficient documentation

## 2016-12-13 ENCOUNTER — Other Ambulatory Visit: Payer: Self-pay | Admitting: Otolaryngology

## 2016-12-13 DIAGNOSIS — R221 Localized swelling, mass and lump, neck: Secondary | ICD-10-CM

## 2016-12-16 ENCOUNTER — Ambulatory Visit
Admission: RE | Admit: 2016-12-16 | Discharge: 2016-12-16 | Disposition: A | Discharge status: Home / Self Care | Payer: Medicare Other | Source: Ambulatory Visit | Attending: Otolaryngology | Admitting: Otolaryngology

## 2016-12-16 DIAGNOSIS — R221 Localized swelling, mass and lump, neck: Secondary | ICD-10-CM | POA: Insufficient documentation

## 2016-12-16 DIAGNOSIS — E042 Nontoxic multinodular goiter: Secondary | ICD-10-CM | POA: Diagnosis not present

## 2016-12-20 ENCOUNTER — Other Ambulatory Visit: Payer: Self-pay | Admitting: Otolaryngology

## 2016-12-20 DIAGNOSIS — E041 Nontoxic single thyroid nodule: Secondary | ICD-10-CM

## 2017-01-28 ENCOUNTER — Ambulatory Visit
Admission: EM | Admit: 2017-01-28 | Discharge: 2017-01-28 | Disposition: A | Discharge status: Home / Self Care | Payer: Medicare Other | Attending: Family Medicine | Admitting: Family Medicine

## 2017-01-28 ENCOUNTER — Encounter: Payer: Self-pay | Admitting: *Deleted

## 2017-01-28 DIAGNOSIS — B86 Scabies: Secondary | ICD-10-CM

## 2017-01-28 MED ORDER — PERMETHRIN 5 % EX CREA: TOPICAL_CREAM | CUTANEOUS | 1 refills | Status: DC

## 2017-01-28 NOTE — Discharge Instructions (Signed)
Medication as prescribed.  Take care  Dr. Mansfield Dann  

## 2017-01-28 NOTE — ED Provider Notes (Signed)
[facility]    CSN: [id number] Arrival date & time: 01/28/17  0808     History   Chief Complaint Chief Complaint  Patient presents with  . Rash   HPI  71 year old female presents with complaints of rash. She is concerned that she has scabies.  Patient reports that she developed a severe pleuritic rash on Thursday. Started in the upper trunk and has spread to the abdomen, underneath the breasts, and back. Intensely pruritic. No new exposures. No new medications. She states that the only thing that has occurred differently was the fact that she recently tried on some new close the dressing room/store. She states that the rash appears and is intensely pruritic like the prior time where she had scabies. No known relieving factors. She has no other complaints or concerns at this time.  Past Medical History:  Diagnosis Date  . Diabetes mellitus without complication ([place])   . Mitral valve regurgitation   . UTI (lower urinary tract infection)    Past Surgical History:  Procedure Laterality Date  . APPENDECTOMY    . TONSILLECTOMY      OB History    No data available       Home Medications    Prior to Admission medications   Medication Sig Start Date End Date Taking? Authorizing Provider  VITAMIN D, CHOLECALCIFEROL, PO Take by mouth.   Yes [provider]  diazepam (VALIUM) 2 MG tablet Take 1 tablet (2 mg total) by mouth every 12 (twelve) hours as needed for anxiety or muscle spasms. 05/08/16 05/08/17  Carrie Mew, MD  empagliflozin (JARDIANCE) 10 MG TABS tablet Take 10 mg by mouth daily.    [provider]  erythromycin ophthalmic ointment Place a 1/2 inch ribbon of ointment into the lower eyelid qid x 7 days 11/02/15   Norval Gable, MD  fluconazole (DIFLUCAN) 150 MG tablet Take 1 tablet (150 mg total) by mouth once. 06/25/15   Frederich Cha, MD  loratadine (CLARITIN) 10 MG tablet Take 1 tablet (10 mg total) by mouth daily. Take 1 tablet in the  morning. As needed for itching. 06/25/15   Frederich Cha, MD  permethrin (ELIMITE) 5 % cream Thoroughly massage cream from head to soles of feet; leave on for 8 to 14 hours before removing (shower or bath) may repeat 14 days after first treatment if still symptomatic 01/28/17   Coral Spikes, DO  ranitidine (ZANTAC) 150 MG capsule Take 1 capsule (150 mg total) by mouth 2 (two) times daily. 06/25/15   Frederich Cha, MD    Family History History reviewed. No pertinent family history.  Social History Social History  Substance Use Topics  . Smoking status: Never Smoker  . Smokeless tobacco: Never Used  . Alcohol use No     Allergies   Codeine; Levaquin [levofloxacin]; Penicillins; Sulfur; and Latex   Review of Systems Review of Systems  Constitutional: Negative.   Skin: Positive for rash.   Physical Exam Triage Vital Signs ED Triage Vitals  Enc Vitals Group     BP 01/28/17 0815 (!) 108/58     Pulse Rate 01/28/17 0815 63     Resp 01/28/17 0815 16     Temp 01/28/17 0815 97.8 F (36.6 C)     Temp Source 01/28/17 0815 Oral     SpO2 01/28/17 0815 100 %     Weight 01/28/17 0817 190 lb (86.2 kg)     Height 01/28/17 0817 5\' 6"  (1.676 m)  Head Circumference --      Peak Flow --      Pain Score --      Pain Loc --      Pain Edu? --      Excl. in [place]? --    Updated Vital Signs BP (!) 108/58 (BP Location: Left Arm)   Pulse 63   Temp 97.8 F (36.6 C) (Oral)   Resp 16   Ht 5\' 6"  (1.676 m)   Wt 190 lb (86.2 kg)   SpO2 100%   BMI 30.67 kg/m   Physical Exam  Constitutional: She is oriented to person, place, and time. She appears well-developed. No distress.  Cardiovascular: Normal rate and regular rhythm.   Pulmonary/Chest: Effort normal and breath sounds normal.  Neurological: She is alert and oriented to person, place, and time.  Skin:  Discrete papules with overlying erythema and evidence of excoriation noted over the upper trunk, abdomen, back, and underneath the breasts.    Psychiatric: She has a normal mood and affect.  Vitals reviewed.  UC Treatments / Results  Labs (all labs ordered are listed, but only abnormal results are displayed) Labs Reviewed - No data to display  EKG  EKG Interpretation None       Radiology No results found.  Procedures Procedures (including critical care time)  Medications Ordered in UC Medications - No data to display   Initial Impression / Assessment and Plan / UC Course  I have reviewed the triage vital signs and the nursing notes.  Pertinent labs & imaging results that were available during my care of the patient were reviewed by me and considered in my medical decision making (see chart for details).   71 year old female presents with signs and symptoms consistent with scabies. Treated with permethrin.  Final Clinical Impressions(s) / UC Diagnoses   Final diagnoses:  Scabies    New Prescriptions New Prescriptions   PERMETHRIN (ELIMITE) 5 % CREAM    Thoroughly massage cream from head to soles of feet; leave on for 8 to 14 hours before removing (shower or bath) may repeat 14 days after first treatment if still symptomatic     Coral Spikes, DO 01/28/17 [phone]

## 2017-01-28 NOTE — ED Triage Notes (Signed)
Generalized rash, intense itching.

## 2017-02-25 DIAGNOSIS — K5909 Other constipation: Secondary | ICD-10-CM | POA: Insufficient documentation

## 2017-04-13 DIAGNOSIS — D519 Vitamin B12 deficiency anemia, unspecified: Secondary | ICD-10-CM | POA: Insufficient documentation

## 2017-04-25 DIAGNOSIS — M79602 Pain in left arm: Secondary | ICD-10-CM | POA: Insufficient documentation

## 2017-08-10 DIAGNOSIS — E538 Deficiency of other specified B group vitamins: Secondary | ICD-10-CM | POA: Insufficient documentation

## 2017-09-20 ENCOUNTER — Other Ambulatory Visit: Payer: Self-pay

## 2017-09-20 ENCOUNTER — Ambulatory Visit
Admission: EM | Admit: 2017-09-20 | Discharge: 2017-09-20 | Disposition: A | Discharge status: Home / Self Care | Payer: Medicare Other | Attending: Emergency Medicine | Admitting: Emergency Medicine

## 2017-09-20 DIAGNOSIS — K649 Unspecified hemorrhoids: Secondary | ICD-10-CM | POA: Diagnosis not present

## 2017-09-20 NOTE — ED Provider Notes (Signed)
HPI  SUBJECTIVE:  Dana Bautista is a 71 y.o. female who presents with possible hemorrhoid that started 2 days ago.  She reports feeling a painful perirectal mass and rectal burning.  No rectal bleeding, melena.  No fevers.  No recent constipation.  Symptoms are worse with trying to defecate, no alleviating factors.  She has not been eating to try and avoid defecating.  She has tried Preparation H creams and suppositories.  Past medical history of diabetes.  PMD: PCP    Past Medical History:  Diagnosis Date  . Diabetes mellitus without complication ([place])   . Mitral valve regurgitation   . UTI (lower urinary tract infection)     Past Surgical History:  Procedure Laterality Date  . APPENDECTOMY    . TONSILLECTOMY      History reviewed. No pertinent family history.  Social History   Tobacco Use  . Smoking status: Never Smoker  . Smokeless tobacco: Never Used  Substance Use Topics  . Alcohol use: No  . Drug use: No    No current facility-administered medications for this encounter.   Current Outpatient Medications:  .  lubiprostone (AMITIZA) 24 MCG capsule, Take 24 mcg by mouth 2 (two) times daily with a meal., Disp: , Rfl:  .  vitamin B-12 (CYANOCOBALAMIN) 1000 MCG tablet, Take 1,000 mcg by mouth daily., Disp: , Rfl:  .  Vitamin D, Ergocalciferol, (DRISDOL) 50000 units CAPS capsule, Take 50,000 Units by mouth every 7 (seven) days., Disp: , Rfl:  .  VITAMIN D, CHOLECALCIFEROL, PO, Take by mouth., Disp: , Rfl:   Allergies  Allergen Reactions  . Codeine Nausea And Vomiting  . Levaquin [Levofloxacin] Hives  . Penicillins Hives  . Sulfur Other (See Comments)    Lip swelling, difficulty breathing  . Latex Rash     ROS  As noted in HPI.   Physical Exam  BP (!) 112/54 (BP Location: Left Arm)   Pulse 65   Temp 98.1 F (36.7 C) (Oral)   Resp 18   Ht 5\' 6"  (1.676 m)   Wt 179 lb (81.2 kg)   SpO2 100%   BMI 28.89 kg/m   Constitutional: Well developed, well nourished, no  acute distress Eyes:  EOMI, conjunctiva normal bilaterally HENT: Normocephalic, atraumatic,mucus membranes moist Respiratory: Normal inspiratory effort Cardiovascular: Normal rate GI: nondistended Rectal: Large tender hemorrhoid in the 9 o'clock position.  No bleeding.  Skin intact.  Rectal exam otherwise non-painful.  Normal tone.  Normal colored stool in vault. skin: No rash, skin intact Musculoskeletal: no deformities Neurologic: Alert & oriented x 3, no focal neuro deficits Psychiatric: Speech and behavior appropriate   ED Course   Medications - No data to display  No orders of the defined types were placed in this encounter.   No results found for this or any previous visit (from the past 24 hour(s)). No results found.  ED Clinical Impression  Hemorrhoids, unspecified hemorrhoid type   ED Assessment/Plan  We will arrange an appointment with [facility] to have this definitively removed.  Staff to call them tomorrow.  Also gave patient list of surgical practices in the [facility] area for her to try call for close follow-up.  In the meantime, stool softeners, continue Preparation H, sitz baths.  Discussed  MDM, plan and followup with patient.  Pt agrees with plan.   No orders of the defined types were placed in this encounter.   *This clinic note was created using Dragon dictation software. Therefore, there may  be occasional mistakes despite careful proofreading.   ?   Melynda Ripple, MD 09/21/17 [phone]

## 2017-09-20 NOTE — ED Triage Notes (Signed)
Patient complains of hemorrhoids that started on Christmas Eve. Patient states that she can feel a knot. Patient reports burning up in to rectum. Patient states that she has never had any issues before and reports no recent constipation. Patient states that she has not been eating to avoid having to use restroom secondary to pain.

## 2017-09-20 NOTE — Discharge Instructions (Signed)
Continue the Preparation H cream and suppositories.  Sitz baths, stool softeners as we discussed.  Follow-up with 1 of the surgical groups as soon as you possibly can have this definitively removed.  We will attempt to call [facility] tomorrow and arrange an appointment for you.

## 2017-09-21 ENCOUNTER — Telehealth: Payer: Self-pay

## 2017-09-21 NOTE — Telephone Encounter (Signed)
Patient called about referral to [facility]. Appointment has been made for tomorrow 09/22/2017 at 230pm. Patient advised of appointment and verbalized understanding. Patient will call back with any questions or concerns. [facility]

## 2017-09-22 ENCOUNTER — Ambulatory Visit (INDEPENDENT_AMBULATORY_CARE_PROVIDER_SITE_OTHER): Payer: Medicare Other | Admitting: Surgery

## 2017-09-22 ENCOUNTER — Encounter: Payer: Self-pay | Admitting: Surgery

## 2017-09-22 VITALS — BP 117/71 | HR 66 | Temp 98.1°F | Ht 66.0 in | Wt 179.0 lb

## 2017-09-22 DIAGNOSIS — K645 Perianal venous thrombosis: Secondary | ICD-10-CM | POA: Diagnosis not present

## 2017-09-22 NOTE — Patient Instructions (Addendum)
Avoid using Preparation H cream or ointments, or suppositories at this time. Please take sitx baths twice daily and after each bowel movement.  Please continue stool softener and Miralax daily to avoid constipation. Please see your follow up appointment listed below. How to Take a Sitz Bath A sitz bath is a warm water bath that is taken while you are sitting down. The water should only come up to your hips and should cover your buttocks. Your health care provider may recommend a sitz bath to help you:  Clean the lower part of your body, including your genital area.  With itching.  With pain.  With sore muscles or muscles that tighten or spasm.  How to take a sitz bath Take 3-4 sitz baths per day or as told by your health care provider. 1. Partially fill a bathtub with warm water. You will only need the water to be deep enough to cover your hips and buttocks when you are sitting in it. 2. If your health care provider told you to put medicine in the water, follow the directions exactly. 3. Sit in the water and open the tub drain a little. 4. Turn on the warm water again to keep the tub at the correct level. Keep the water running constantly. 5. Soak in the water for 15-20 minutes or as told by your health care provider. 6. After the sitz bath, pat the affected area dry first. Do not rub it. 7. Be careful when you stand up after the sitz bath because you may feel dizzy.  Contact a health care provider if:  Your symptoms get worse. Do not continue with sitz baths if your symptoms get worse.  You have new symptoms. Do not continue with sitz baths until you talk with your health care provider. This information is not intended to replace advice given to you by your health care provider. Make sure you discuss any questions you have with your health care provider. Document Released: 06/04/2004 Document Revised: 02/10/2016 Document Reviewed: 09/10/2014 Elsevier Interactive Patient Education   [employer].

## 2017-09-22 NOTE — Progress Notes (Signed)
Patient ID: Dana Bautista, female   DOB: 24-Mar-1946, 71 y.o.   MRN: [id number]  HPI Dana Bautista is a 71 y.o. female asked to see in consultation by Dr. Alphonzo Cruise. She states she's had a 3 day history of acute onset of anorectal pain that is moderate to severe. Sharp in nature and worsens when she had a bowel movement. She did have some hematochezia this morning. She had a recent incomplete colonoscopy and she is scheduled for another one in the near future. No fevers no chills. Never had any hemorrhoid surgery in the past. She is able to perform more than 4 Mets without any shortness of breath or chest pain. Wbc nml, hb 11.8, creat .6  HPI  Past Medical History:  Diagnosis Date  . Diabetes mellitus without complication ([place])   . Mitral valve regurgitation   . UTI (lower urinary tract infection)     Past Surgical History:  Procedure Laterality Date  . ABDOMINAL HYSTERECTOMY    . APPENDECTOMY    . TONSILLECTOMY      Family History  Problem Relation Age of Onset  . Breast cancer Mother   . Asthma Mother   . Lung cancer Father   . Heart disease Brother   . Heart disease Maternal Grandfather   . Throat cancer Maternal Grandfather     Social History Social History   Tobacco Use  . Smoking status: Never Smoker  . Smokeless tobacco: Never Used  Substance Use Topics  . Alcohol use: No  . Drug use: No    Allergies  Allergen Reactions  . Codeine Nausea And Vomiting  . Levaquin [Levofloxacin] Hives  . Penicillins Hives  . Sulfur Other (See Comments)    Lip swelling, difficulty breathing  . Latex Rash    Current Outpatient Medications  Medication Sig Dispense Refill  . lubiprostone (AMITIZA) 24 MCG capsule Take 24 mcg by mouth 2 (two) times daily with a meal.    . vitamin B-12 (CYANOCOBALAMIN) 1000 MCG tablet Take 1,000 mcg by mouth daily.    Marland Kitchen VITAMIN D, CHOLECALCIFEROL, PO Take by mouth.    . Vitamin D, Ergocalciferol, (DRISDOL) 50000 units CAPS capsule Take 50,000 Units by  mouth every 7 (seven) days.     No current facility-administered medications for this visit.      Review of Systems Full ROS  was asked and was negative except for the information on the HPI  Physical Exam Blood pressure 117/71, pulse 66, temperature 98.1 F (36.7 C), temperature source Oral, height 5\' 6"  (1.676 m), weight 81.2 kg (179 lb). CONSTITUTIONAL: NAD, alert  EYES:  Sclera are non-icteric nml conjuntiva EARS, NOSE, MOUTH AND THROAT:  The oral mucosa is pink and moist. Hearing is intact to voice. RESPIRATORY:  There is normal respiratory effort,  without pathologic use of accessory muscles.GI: The abdomen is  soft, nontender, and nondistended. There are no palpable masses. There is no hepatosplenomegaly. There are normal bowel sounds in all quadrants. RECTAL: THROMBOSED EXT. HEMORRHOID, TENDER, lEFT LATERAL MUSCULOSKELETAL: Normal muscle strength and tone. No cyanosis or edema.   SKIN: Turgor is good and there are no pathologic skin lesions or ulcers. NEUROLOGIC: Motor and sensation is grossly normal. Cranial nerves are grossly intact. PSYCH:  Oriented to person, place and time. Affect is normal.  Data Reviewed  I have personally reviewed the patient's imaging, laboratory findings and medical records.    Assessment/Plan Acute thrombosed hemorrhoid discussed with the patient in detail options of I&D versus medical management. She  is still having significant pain and hemorrhoids under tension and wishes to have an I&D of the hemorrhoid. Discussed with the patient in detail about the procedure risks benefits and possible complications. RTC 2-3 wks, sitz baths, high fiber and avoid prep H. Copy of the report will be sent to the referring provider  PROCEDURE NOTE: DX; thrombosed external hemorrhoid  Procedure: I/D excision thrombosed hemorrhoid  Anesthesia: Lidocaine 1% w epi 01ID  Complications none  After informed consent was obtained patient was prepped and draped in the  usual fashion and lidocaine was used to infiltrate the area. Using a 11 blade knife we created a cruciate incision and evacuated the clot. Hemostasis was obtained with pressure. No complications      Caroleen Hamman, MD FACS General Surgeon 09/22/2017, 11:33 AM

## 2017-10-12 ENCOUNTER — Ambulatory Visit (INDEPENDENT_AMBULATORY_CARE_PROVIDER_SITE_OTHER): Payer: Medicare Other | Admitting: Surgery

## 2017-10-12 ENCOUNTER — Encounter: Payer: Self-pay | Admitting: Surgery

## 2017-10-12 VITALS — BP 108/70 | HR 67 | Temp 98.7°F | Wt 179.0 lb

## 2017-10-12 DIAGNOSIS — Z09 Encounter for follow-up examination after completed treatment for conditions other than malignant neoplasm: Secondary | ICD-10-CM

## 2017-10-12 NOTE — Patient Instructions (Signed)
Hemorrhoids    Hemorrhoids are swollen veins in and around the rectum or anus. Hemorrhoids can cause pain, itching, or bleeding. Most of the time, they do not cause serious problems. They usually get better with diet changes, lifestyle changes, and other home treatments.  Follow these instructions at home:  Eating and drinking  · Eat foods that have fiber, such as whole grains, beans, nuts, fruits, and vegetables. Ask your doctor about taking products that have added fiber (fiber supplements).  · Drink enough fluid to keep your pee (urine) clear or pale yellow.  For Pain and Swelling  · Take a warm-water bath (sitz bath) for 20 minutes to ease pain. Do this 3-4 times a day.  · If directed, put ice on the painful area. It may be helpful to use ice between your warm baths.  ¨ Put ice in a plastic bag.  ¨ Place a towel between your skin and the bag.  ¨ Leave the ice on for 20 minutes, 2-3 times a day.  General instructions  · Take over-the-counter and prescription medicines only as told by your doctor.  ¨ Medicated creams and medicines that are inserted into the anus (suppositories) may be used or applied as told.  · Exercise often.  · Go to the bathroom when you have the urge to poop (to have a bowel movement). Do not wait.  · Avoid pushing too hard (straining) when you poop.  · Keep the butt area dry and clean. Use wet toilet paper or moist paper towels.  · Do not sit on the toilet for a long time.  Contact a doctor if:  · You have any of these:  ¨ Pain and swelling that do not get better with treatment or medicine.  ¨ Bleeding that will not stop.  ¨ Trouble pooping or you cannot poop.  ¨ Pain or swelling outside the area of the hemorrhoids.  This information is not intended to replace advice given to you by your health care provider. Make sure you discuss any questions you have with your health care provider.  Document Released: 06/21/2008 Document Revised: 02/18/2016 Document Reviewed: 05/27/2015  Elsevier  Interactive Patient Education © 2018 [employer]   

## 2017-10-12 NOTE — Progress Notes (Signed)
S/p I/d Thrombosed hemorrhoid Feeling much better. She cannot feel any hemorrhoids. Upcoming colonoscopy next month  PE:  NAD, alert Abd: soft Pt prefers not to have rectal exam today  A/P Doing well Continue sitz baths, stools softener Keep colonoscopy F/U Prn

## 2017-12-11 ENCOUNTER — Ambulatory Visit
Admission: RE | Admit: 2017-12-11 | Discharge: 2017-12-11 | Disposition: A | Discharge status: Home / Self Care | Payer: Medicare Other | Source: Ambulatory Visit | Attending: Otolaryngology | Admitting: Otolaryngology

## 2017-12-11 DIAGNOSIS — E042 Nontoxic multinodular goiter: Secondary | ICD-10-CM | POA: Insufficient documentation

## 2017-12-11 DIAGNOSIS — E041 Nontoxic single thyroid nodule: Secondary | ICD-10-CM | POA: Diagnosis present

## 2017-12-13 ENCOUNTER — Other Ambulatory Visit: Payer: Self-pay | Admitting: Otolaryngology

## 2017-12-13 DIAGNOSIS — E041 Nontoxic single thyroid nodule: Secondary | ICD-10-CM

## 2017-12-18 ENCOUNTER — Ambulatory Visit
Admission: RE | Admit: 2017-12-18 | Discharge: 2017-12-18 | Disposition: A | Discharge status: Home / Self Care | Payer: Medicare Other | Source: Ambulatory Visit | Attending: Otolaryngology | Admitting: Otolaryngology

## 2017-12-18 DIAGNOSIS — E041 Nontoxic single thyroid nodule: Secondary | ICD-10-CM | POA: Diagnosis not present

## 2017-12-18 HISTORY — DX: Other complications of anesthesia, initial encounter: T88.59XA

## 2017-12-18 HISTORY — DX: Cardiac murmur, unspecified: R01.1

## 2017-12-18 HISTORY — DX: Enthesopathy, unspecified: M77.9

## 2017-12-18 HISTORY — DX: Adverse effect of unspecified anesthetic, initial encounter: T41.45XA

## 2017-12-18 HISTORY — DX: Malignant (primary) neoplasm, unspecified: C80.1

## 2017-12-18 HISTORY — DX: Unspecified osteoarthritis, unspecified site: M19.90

## 2017-12-18 NOTE — Procedures (Signed)
US left thyroid biopsy without difficulty  Complications:  None  Blood Loss: none  See dictation in canopy pacs  

## 2018-01-22 ENCOUNTER — Encounter: Payer: Self-pay | Admitting: Otolaryngology

## 2018-01-22 ENCOUNTER — Other Ambulatory Visit: Payer: Self-pay | Admitting: Pathology

## 2018-01-22 LAB — CYTOLOGY - NON PAP

## 2018-02-23 DIAGNOSIS — R0681 Apnea, not elsewhere classified: Secondary | ICD-10-CM | POA: Insufficient documentation

## 2018-06-14 DIAGNOSIS — G4733 Obstructive sleep apnea (adult) (pediatric): Secondary | ICD-10-CM | POA: Insufficient documentation

## 2018-06-19 ENCOUNTER — Other Ambulatory Visit: Payer: Self-pay | Admitting: Otolaryngology

## 2018-06-19 DIAGNOSIS — E041 Nontoxic single thyroid nodule: Secondary | ICD-10-CM

## 2018-07-12 DIAGNOSIS — M542 Cervicalgia: Secondary | ICD-10-CM | POA: Insufficient documentation

## 2018-07-16 ENCOUNTER — Other Ambulatory Visit: Payer: Self-pay | Admitting: Unknown Physician Specialty

## 2018-07-16 DIAGNOSIS — M542 Cervicalgia: Secondary | ICD-10-CM

## 2018-08-08 ENCOUNTER — Ambulatory Visit
Admission: RE | Admit: 2018-08-08 | Discharge: 2018-08-08 | Disposition: A | Discharge status: Home / Self Care | Payer: Medicare Other | Source: Ambulatory Visit | Attending: Unknown Physician Specialty | Admitting: Unknown Physician Specialty

## 2018-08-08 DIAGNOSIS — S22039A Unspecified fracture of third thoracic vertebra, initial encounter for closed fracture: Secondary | ICD-10-CM | POA: Diagnosis not present

## 2018-08-08 DIAGNOSIS — M5412 Radiculopathy, cervical region: Secondary | ICD-10-CM | POA: Diagnosis present

## 2018-08-08 DIAGNOSIS — X58XXXA Exposure to other specified factors, initial encounter: Secondary | ICD-10-CM | POA: Insufficient documentation

## 2018-08-08 DIAGNOSIS — M50223 Other cervical disc displacement at C6-C7 level: Secondary | ICD-10-CM | POA: Diagnosis not present

## 2018-08-08 DIAGNOSIS — M542 Cervicalgia: Secondary | ICD-10-CM

## 2018-08-08 IMAGING — MR MR CERVICAL SPINE W/O CM
5 series · 40 of 48 positions shown · non-contrast
Comparison: None.

CLINICAL DATA: Neck pain into the left shoulder for a few months.

EXAM:
MRI CERVICAL SPINE WITHOUT CONTRAST
TECHNIQUE: Multiplanar, multisequence MR imaging of the cervical spine was
performed. No intravenous contrast was administered.

[Series 5: T2 · sagittal · 3.0mm · 0.62mm/px · 6 of 15 slices shown (1 of 2)]
[im 1/15]
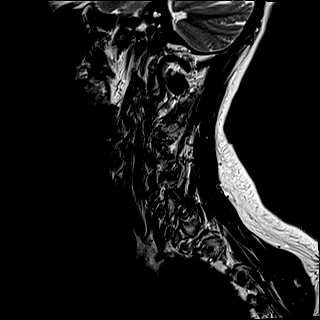
[im 3/15]
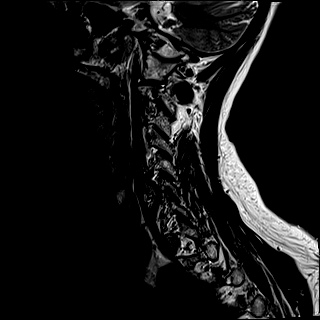
[im 6/15]
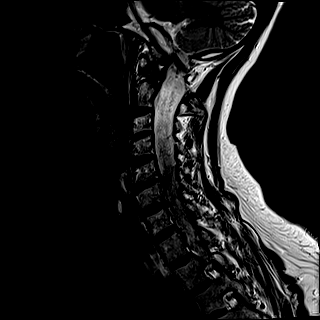
[im 9/15]
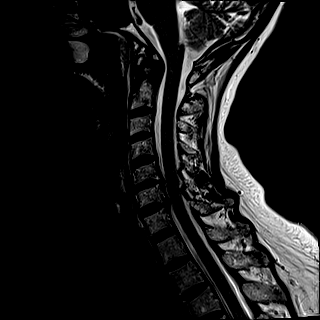
[im 12/15]
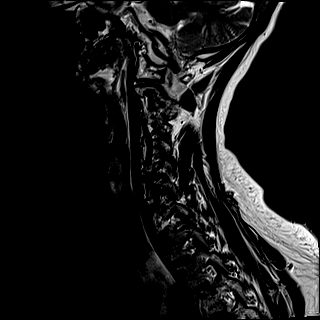
[im 15/15]
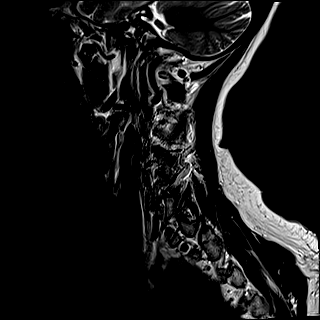

[Series 6: FLAIR · sagittal · 3.0mm · 0.78mm/px · 7 of 15 slices shown]
[im 1/15]
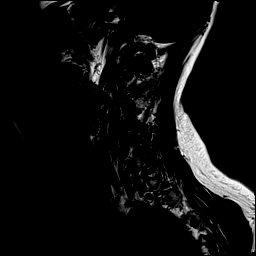
[im 3/15]
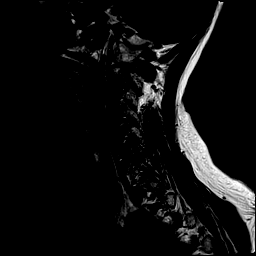
[im 5/15]
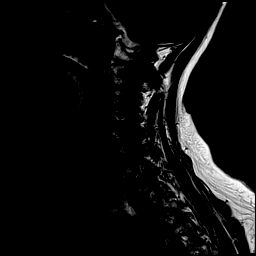
[im 8/15]
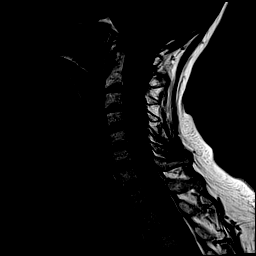
[im 10/15]
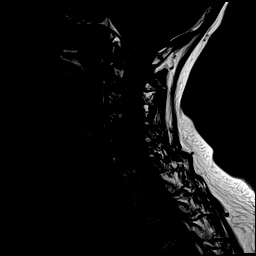
[im 12/15]
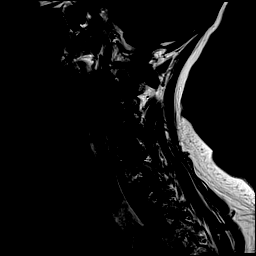
[im 15/15]
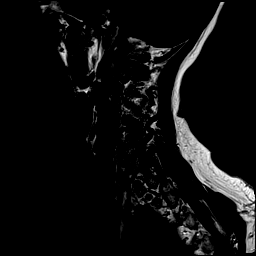

[Series 7: STIR · sagittal · 3.0mm · 0.62mm/px · 7 of 15 slices shown]
[im 1/15]
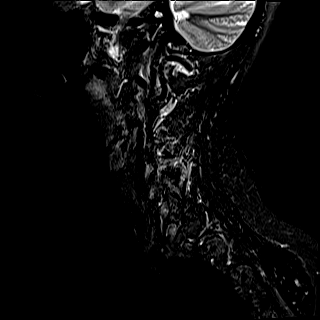
[im 3/15]
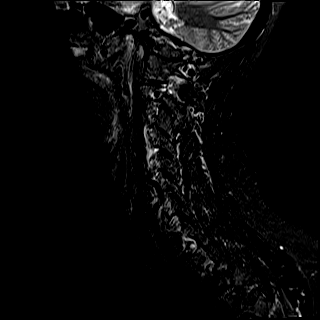
[im 5/15]
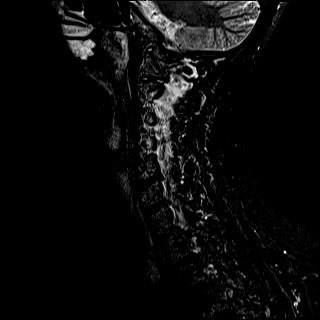
[im 8/15]
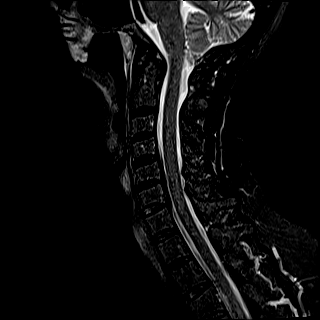
[im 10/15]
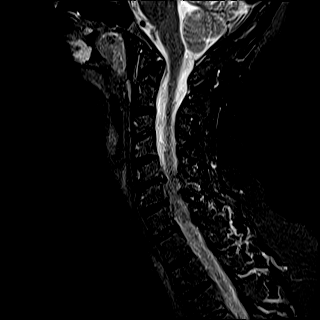
[im 12/15]
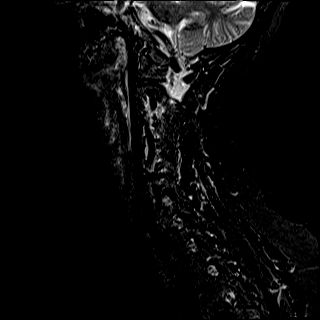
[im 15/15]
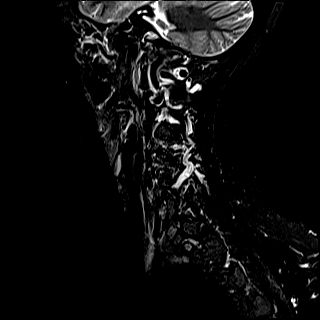

[Series 8: T2 · axial · 3.0mm · 0.70mm/px · z∈[-63,+35]mm · 12 of 29 slices shown (2 of 2)]
[im 1/29]
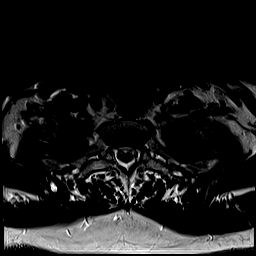
[im 3/29]
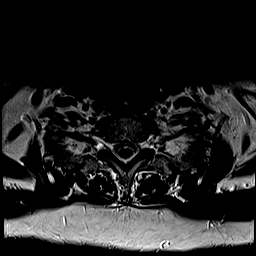
[im 5/29]
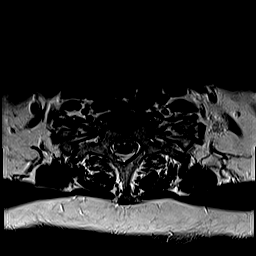
[im 7/29]
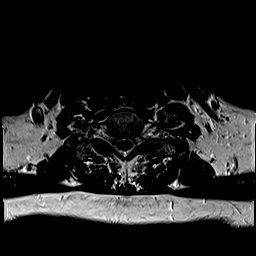
[im 9/29]
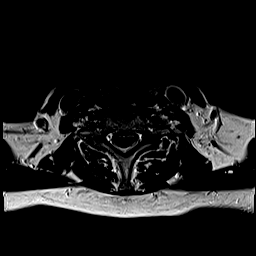
[im 11/29]
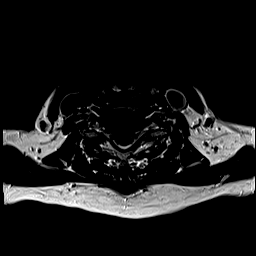
[im 13/29]
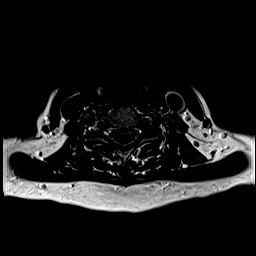
[im 16/29]
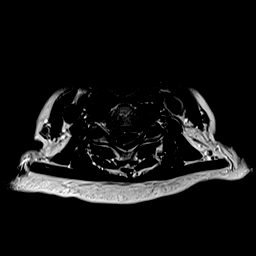
[im 18/29]
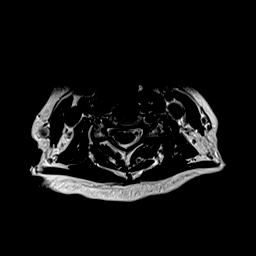
[im 20/29]
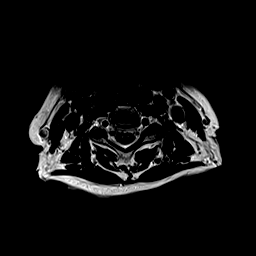
[im 24/29]
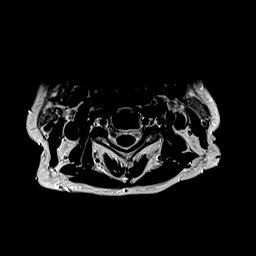
[im 29/29]
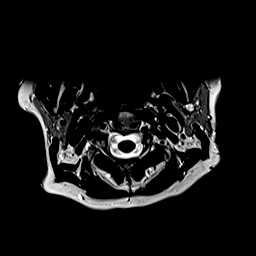

[Series 9: ax mpgr · axial · 3.0mm · 0.35mm/px · z∈[-63,+35]mm · 8 of 29 slices shown]
[im 1/29]
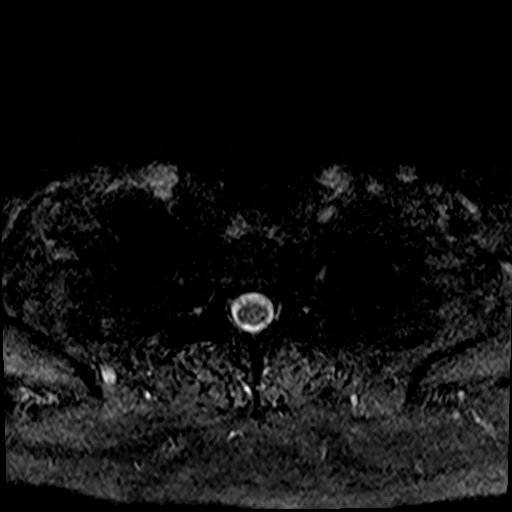
[im 5/29]
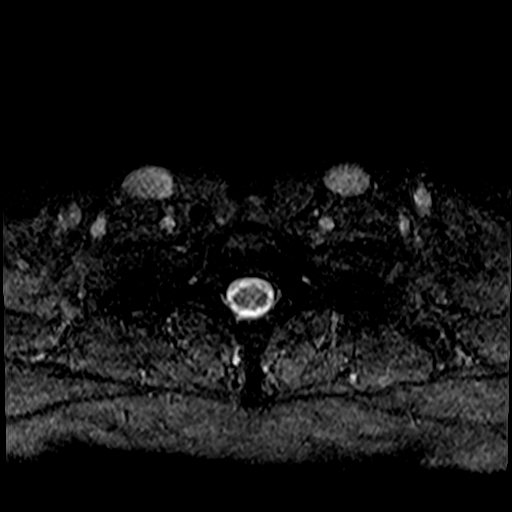
[im 9/29]
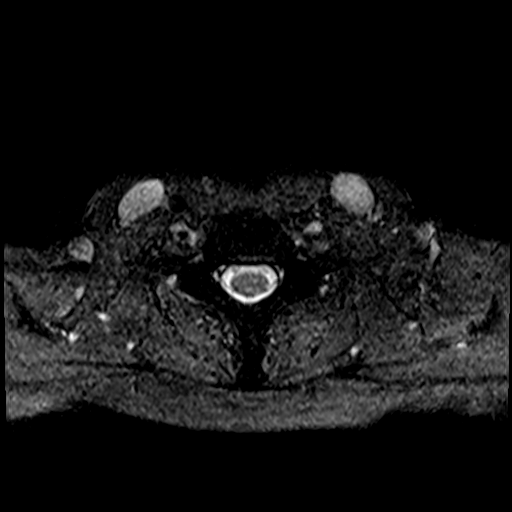
[im 13/29]
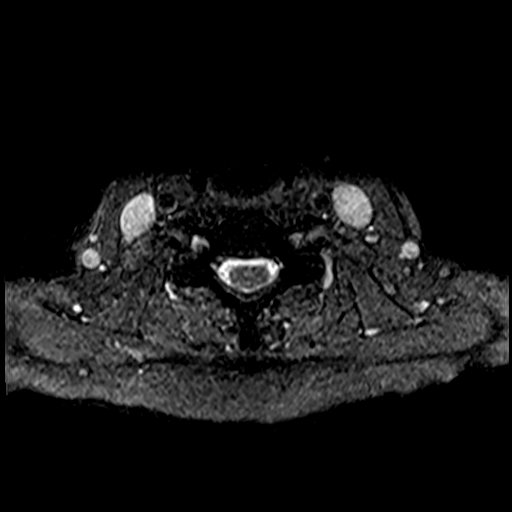
[im 16/29]
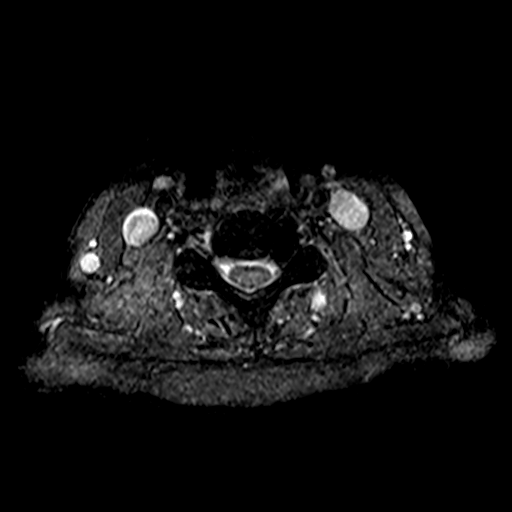
[im 20/29]
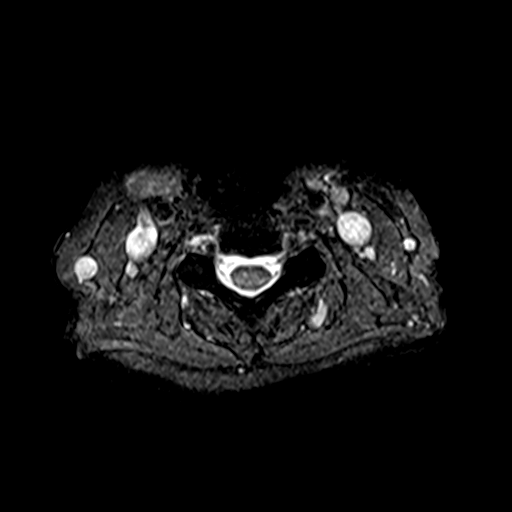
[im 24/29]
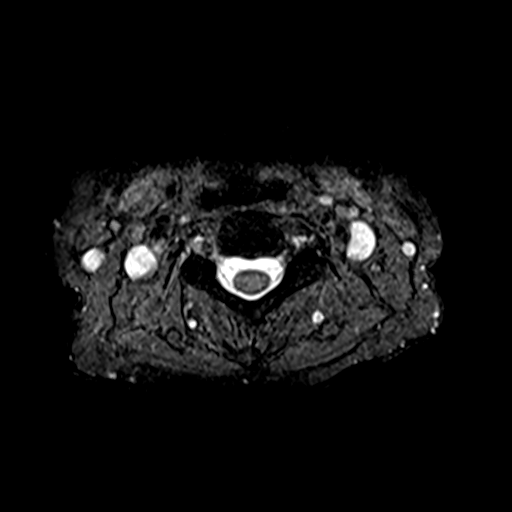
[im 29/29]
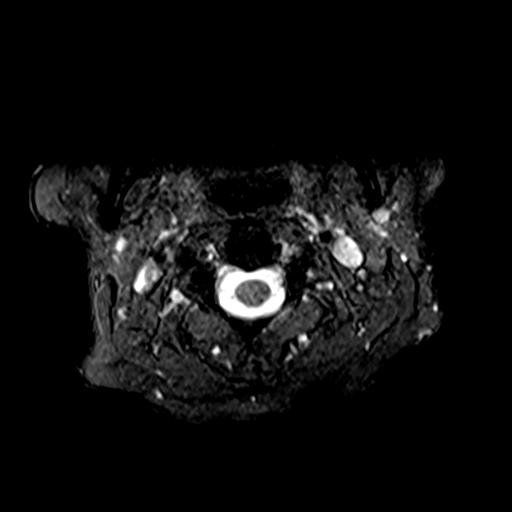

[40 of 48 positions shown; findings below may reference images not displayed]

FINDINGS: Alignment: Physiologic.

Vertebrae: No acute fracture, evidence of discitis, or bone lesion.
Remote T3 superior endplate fracture

Cord: Normal signal and morphology

Posterior Fossa, vertebral arteries, paraspinal tissues:
Subcentimeter left thyroid nodule, below size threshold for
sonographic follow-up per consensus guidelines.

Disc levels:

C2-3: Tiny central protrusion.  No impingement

C3-4: Facet ankylosis on the left

C4-5: Degenerative facet spurring on the left.

C5-6: Disc narrowing with endplate and uncovertebral spurring.
Advanced left foraminal impingement. Right foraminal narrowing is
mild.

C6-7: Disc narrowing with a noncompressive central protrusion.
Patent canal and foramina

C7-T1:Unremarkable.
IMPRESSION: 1. C5-6 advanced left foraminal impingement due to disc height loss
and uncovertebral spurring.
2. C6-7 noncompressive disc protrusion.
3. C3-4 left facet ankylosis.
4. Remote T3 superior endplate fracture.

## 2018-09-01 DIAGNOSIS — M5030 Other cervical disc degeneration, unspecified cervical region: Secondary | ICD-10-CM | POA: Insufficient documentation

## 2018-09-26 DIAGNOSIS — Z8614 Personal history of Methicillin resistant Staphylococcus aureus infection: Secondary | ICD-10-CM

## 2018-09-26 DIAGNOSIS — I2699 Other pulmonary embolism without acute cor pulmonale: Secondary | ICD-10-CM

## 2018-09-26 HISTORY — DX: Personal history of Methicillin resistant Staphylococcus aureus infection: Z86.14

## 2018-09-26 HISTORY — DX: Other pulmonary embolism without acute cor pulmonale: I26.99

## 2018-10-04 DIAGNOSIS — M1711 Unilateral primary osteoarthritis, right knee: Secondary | ICD-10-CM | POA: Insufficient documentation

## 2018-10-04 DIAGNOSIS — G8929 Other chronic pain: Secondary | ICD-10-CM | POA: Insufficient documentation

## 2018-11-02 ENCOUNTER — Other Ambulatory Visit: Payer: Self-pay

## 2018-11-02 ENCOUNTER — Encounter: Payer: Self-pay | Admitting: Emergency Medicine

## 2018-11-02 ENCOUNTER — Ambulatory Visit: Payer: Medicare Other

## 2018-11-02 ENCOUNTER — Ambulatory Visit
Admission: EM | Admit: 2018-11-02 | Discharge: 2018-11-02 | Disposition: A | Discharge status: Home / Self Care | Payer: Medicare Other | Attending: Emergency Medicine | Admitting: Emergency Medicine

## 2018-11-02 DIAGNOSIS — R69 Illness, unspecified: Secondary | ICD-10-CM

## 2018-11-02 DIAGNOSIS — J0110 Acute frontal sinusitis, unspecified: Secondary | ICD-10-CM | POA: Diagnosis present

## 2018-11-02 DIAGNOSIS — J111 Influenza due to unidentified influenza virus with other respiratory manifestations: Secondary | ICD-10-CM

## 2018-11-02 DIAGNOSIS — R05 Cough: Secondary | ICD-10-CM | POA: Insufficient documentation

## 2018-11-02 DIAGNOSIS — R112 Nausea with vomiting, unspecified: Secondary | ICD-10-CM | POA: Insufficient documentation

## 2018-11-02 DIAGNOSIS — R059 Cough, unspecified: Secondary | ICD-10-CM

## 2018-11-02 LAB — BASIC METABOLIC PANEL
Anion gap: 10 (ref 5–15)
BUN: 10 mg/dL (ref 8–23)
CO2: 24 mmol/L (ref 22–32)
Calcium: 9.1 mg/dL (ref 8.9–10.3)
Chloride: 101 mmol/L (ref 98–111)
Creatinine, Ser: 0.68 mg/dL (ref 0.44–1.00)
GFR calc Af Amer: >60 mL/min (ref >60)
GFR calc non Af Amer: >60 mL/min (ref >60)
GLUCOSE: 121 mg/dL — AB (ref 70–99)
POTASSIUM: 3.9 mmol/L (ref 3.5–5.1)
Sodium: 135 mmol/L (ref 135–145)

## 2018-11-02 LAB — CBC WITH DIFFERENTIAL/PLATELET
Abs Immature Granulocytes: 0 10*3/uL (ref 0.00–0.07)
Basophils Absolute: 0 10*3/uL (ref 0.0–0.1)
Basophils Relative: 0 %
EOS ABS: 0 10*3/uL (ref 0.0–0.5)
Eosinophils Relative: 0 %
HEMATOCRIT: 39.9 % (ref 36.0–46.0)
Hemoglobin: 13.4 g/dL (ref 12.0–15.0)
IMMATURE GRANULOCYTES: 0 %
LYMPHS ABS: 1.6 10*3/uL (ref 0.7–4.0)
Lymphocytes Relative: 46 %
MCH: 28.3 pg (ref 26.0–34.0)
MCHC: 33.6 g/dL (ref 30.0–36.0)
MCV: 84.2 fL (ref 80.0–100.0)
MONO ABS: 0.4 10*3/uL (ref 0.1–1.0)
MONOS PCT: 12 %
NEUTROS PCT: 42 %
Neutro Abs: 1.5 10*3/uL — ABNORMAL LOW (ref 1.7–7.7)
Platelets: 214 10*3/uL (ref 150–400)
RBC: 4.74 MIL/uL (ref 3.87–5.11)
RDW: 13.4 % (ref 11.5–15.5)
WBC: 3.5 10*3/uL — ABNORMAL LOW (ref 4.0–10.5)
nRBC: 0 % (ref 0.0–0.2)

## 2018-11-02 MED ORDER — ONDANSETRON 4 MG PO TBDP: 4.0000 mg | ORAL_TABLET | Freq: Three times a day (TID) | ORAL | 0 refills | Status: DC | PRN

## 2018-11-02 MED ORDER — BENZONATATE 100 MG PO CAPS: 100.0000 mg | ORAL_CAPSULE | Freq: Three times a day (TID) | ORAL | 0 refills | Status: DC | PRN

## 2018-11-02 MED ORDER — ONDANSETRON HCL 4 MG/2ML IJ SOLN: 8.0000 mg | Freq: Once | INTRAMUSCULAR | Status: DC

## 2018-11-02 MED ORDER — DOXYCYCLINE HYCLATE 100 MG PO CAPS: 100.0000 mg | ORAL_CAPSULE | Freq: Two times a day (BID) | ORAL | 0 refills | Status: DC

## 2018-11-02 MED ORDER — ONDANSETRON 8 MG PO TBDP
8.0000 mg | ORAL_TABLET | Freq: Once | ORAL | Status: AC
  Administered 2018-11-02: 8 mg via ORAL

## 2018-11-02 NOTE — Discharge Instructions (Signed)
Take medication as prescribed. Rest. Drink plenty of fluids.  ° °Follow up with your primary care physician this week. Return to Urgent care for new or worsening concerns.  ° °

## 2018-11-02 NOTE — ED Triage Notes (Signed)
Patient c/o HAs, vomiting, cough, chills, fever, and night sweats for about a week.

## 2018-11-02 NOTE — ED Provider Notes (Signed)
[facility] ____________________________________________  Time seen: Approximately 9:49 AM  I have reviewed the triage vital signs and the nursing notes.   HISTORY  Chief Complaint Cough and Fever   HPI Dana Bautista is a 73 y.o. female presenting for evaluation of 6 days of not feeling well.  States this past Saturday she started with nasal congestion, sore throat and body aches.  Reports this continued through Monday, stating accompanying fever as well with T-max 102.  States she has had some night sweats for the last few nights intermittently, but reports overall she feels like her fever has now broken.  States for the last few days she has been having more cough complaints.  No longer having a sore throat.  Continues with nasal congestion, intermittent sinus pressure, intermittent ear discomfort.  States also on Monday she had some nausea with vomiting.  States yesterday she had nausea with posttussive emesis.  Continue with nausea today.  No diarrhea.  Last bowel movement 2 days ago.  Denies abdominal pain.  Denies vomiting today.  Denies chest pain or shortness of breath.  States feels overall fatigued and tired.  Reports she was exposed to influenza this past Friday, and then symptoms started on Saturday.  Has continued to drink fluids well.  Decreased appetite.  Has taken some over-the-counter medications for the same complaints, none today.  Denies other aggravating leaving factors.  No recent sickness.  Glendon Axe, MD: PCP   Past Medical History:  Diagnosis Date  . Arthritis    osteoarthriist  . Cancer ([place])    skin  . Complication of anesthesia    nausea  . Heart murmur   . Mitral valve regurgitation   . Tendonitis    outer aspect of right foot  . UTI (lower urinary tract infection)     Patient Active Problem List   Diagnosis Date Noted  . Vitamin B 12 deficiency 08/10/2017  . Arm pain, left 04/25/2017  . Anemia due to vitamin B12 deficiency 04/13/2017    . Chronic constipation 02/25/2017  . Intermittent chest pain 11/20/2016  . Nausea 11/20/2016  . Right ear impacted cerumen 11/20/2016  . Carotid stenosis, asymptomatic, bilateral 10/06/2016  . Dizziness 06/13/2016  . H/O mitral valve prolapse 06/13/2016  . Neck muscle strain 06/13/2016  . Temporal headache 06/13/2016  . Vitamin D deficiency, unspecified 12/21/2015  . Chronic right-sided low back pain without sciatica 12/16/2015  . Meralgia paresthetica of right side 12/16/2015  . Prediabetes 12/16/2015  . Urge urinary incontinence 12/16/2015    Past Surgical History:  Procedure Laterality Date  . ABDOMINAL HYSTERECTOMY    . APPENDECTOMY    . FRACTURE SURGERY     left wrist  . FRACTURE SURGERY Left    wrist  . TONSILLECTOMY       No current facility-administered medications for this encounter.   Current Outpatient Medications:  .  vitamin B-12 (CYANOCOBALAMIN) 1000 MCG tablet, Take 1,000 mcg by mouth daily., Disp: , Rfl:  .  Vitamin D, Ergocalciferol, (DRISDOL) 50000 units CAPS capsule, Take 50,000 Units by mouth every 7 (seven) days., Disp: , Rfl:  .  benzonatate (TESSALON PERLES) 100 MG capsule, Take 1 capsule (100 mg total) by mouth 3 (three) times daily as needed for cough., Disp: 15 capsule, Rfl: 0 .  doxycycline (VIBRAMYCIN) 100 MG capsule, Take 1 capsule (100 mg total) by mouth 2 (two) times daily., Disp: 20 capsule, Rfl: 0 .  lubiprostone (AMITIZA) 24 MCG capsule, Take 24 mcg by mouth  2 (two) times daily with a meal., Disp: , Rfl:  .  naproxen (NAPROSYN) 500 MG tablet, Take 500 mg by mouth 2 (two) times daily with a meal., Disp: , Rfl: 0 .  ondansetron (ZOFRAN ODT) 4 MG disintegrating tablet, Take 1 tablet (4 mg total) by mouth every 8 (eight) hours as needed for nausea or vomiting., Disp: 15 tablet, Rfl: 0 .  VITAMIN D, CHOLECALCIFEROL, PO, Take by mouth., Disp: , Rfl:   Allergies Codeine; Levaquin [levofloxacin]; Penicillins; Sulfur; and Latex  Family History   Problem Relation Age of Onset  . Breast cancer Mother   . Asthma Mother   . Lung cancer Father   . Heart disease Brother   . Heart disease Maternal Grandfather   . Throat cancer Maternal Grandfather     Social History Social History   Tobacco Use  . Smoking status: Never Smoker  . Smokeless tobacco: Never Used  Substance Use Topics  . Alcohol use: No  . Drug use: No    Review of Systems Constitutional: Positive fever ENT: As above Cardiovascular: Denies chest pain. Respiratory: Denies shortness of breath. Gastrointestinal: No abdominal pain.  Positive nausea.  Intermittent vomiting.  No diarrhea. Genitourinary: Negative for dysuria. Musculoskeletal: Generalized body aches. Skin: Negative for rash. Neurological: Negative for focal weakness or numbness.   ____________________________________________   PHYSICAL EXAM:  VITAL SIGNS: ED Triage Vitals  Enc Vitals Group     BP 11/02/18 0908 102/63     Pulse Rate 11/02/18 0908 91     Resp 11/02/18 0908 16     Temp 11/02/18 0908 98.2 F (36.8 C)     Temp Source 11/02/18 0908 Oral     SpO2 11/02/18 0908 100 %     Weight 11/02/18 0906 180 lb (81.6 kg)     Height 11/02/18 0906 5\' 6"  (1.676 m)     Head Circumference --      Peak Flow --      Pain Score 11/02/18 0906 4     Pain Loc --      Pain Edu? --      Excl. in [place]? --     Constitutional: Alert and oriented. Well appearing and in no acute distress. Eyes: Conjunctivae are normal.  Head: Atraumatic.Mild tenderness to palpation bilateral frontal sinuses.  No maxillary sinus tenderness palpation.  No swelling. No erythema.   Ears: no erythema, normal TMs bilaterally.   Nose: nasal congestion with bilateral nasal turbinate erythema and edema.   Mouth/Throat: Mucous membranes are moist.  Oropharynx non-erythematous.No tonsillar swelling or exudate.  Neck: No stridor.  No cervical spine tenderness to palpation. Hematological/Lymphatic/Immunilogical: No cervical  lymphadenopathy. Cardiovascular: Normal rate, regular rhythm. Grossly normal heart sounds.  Good peripheral circulation. Respiratory: Normal respiratory effort.  No retractions. No wheezes, rales or rhonchi. Good air movement.  Gastrointestinal: Soft and nontender. No distention. Normal Bowel sounds. No CVA tenderness. Musculoskeletal: Steady gait.  No lower extremity edema noted bilaterally. Neurologic:  Normal speech and language. No gross focal neurologic deficits are appreciated. No gait instability. Skin:  Skin is warm, dry and intact. No rash noted. Psychiatric: Mood and affect are normal. Speech and behavior are normal.  ___________________________________________   LABS (all labs ordered are listed, but only abnormal results are displayed)  Labs Reviewed  CBC WITH DIFFERENTIAL/PLATELET - Abnormal; Notable for the following components:      Result Value   WBC 3.5 (*)    Neutro Abs 1.5 (*)    All other components  within normal limits  BASIC METABOLIC PANEL - Abnormal; Notable for the following components:   Glucose, Bld 121 (*)    All other components within normal limits   ____________________________________________  RADIOLOGY  Dg Chest 2 View  Result Date: 11/02/2018 CLINICAL DATA:  Weakness and cough for 1 week EXAM: CHEST - 2 VIEW COMPARISON:  08/26/2015 FINDINGS: Cardiac shadows within normal limits. The lungs are well aerated bilaterally. No focal infiltrate is seen. Minimal scarring is noted in the left base. Stable compression deformity is noted of the midthoracic spine. IMPRESSION: No acute abnormality noted. Electronically Signed   By: Inez Catalina M.D.   On: 11/02/2018 09:30   ____________________________________________   PROCEDURES Procedures    INITIAL IMPRESSION / ASSESSMENT AND PLAN / ED COURSE  Pertinent labs & imaging results that were available during my care of the patient were reviewed by me and considered in my medical decision making (see chart  for details).  Overall well-appearing patient.  No acute distress.  Suspect recent influenza.  Chest x-ray as above radiologist, no acute abnormality.  CBC, BMP labs reviewed and discussed with patient.  As symptoms have continued with comorbidities, will treat with oral doxycycline.  PRN Zofran and PRN Tessalon Perles.  Zofran 4 mg ODT was given in urgent care, does report improved nausea.  Encourage rest, fluids, supportive care.  Discussed very strict follow-up and return parameters.  Discussed follow up with Primary care physician this week. Discussed follow up and return parameters including no resolution or any worsening concerns. Patient verbalized understanding and agreed to plan.   ____________________________________________   FINAL CLINICAL IMPRESSION(S) / ED DIAGNOSES  Final diagnoses:  Influenza-like illness  Non-intractable vomiting with nausea, unspecified vomiting type  Cough  Acute frontal sinusitis, recurrence not specified     ED Discharge Orders         Ordered    ondansetron (ZOFRAN ODT) 4 MG disintegrating tablet  Every 8 hours PRN     11/02/18 1019    doxycycline (VIBRAMYCIN) 100 MG capsule  2 times daily     11/02/18 1019    benzonatate (TESSALON PERLES) 100 MG capsule  3 times daily PRN     11/02/18 1019           Note: This dictation was prepared with Dragon dictation along with smaller phrase technology. Any transcriptional errors that result from this process are unintentional.         Marylene Land, NP 11/02/18 1027

## 2018-12-05 ENCOUNTER — Ambulatory Visit
Admission: RE | Admit: 2018-12-05 | Discharge: 2018-12-05 | Disposition: A | Discharge status: Home / Self Care | Payer: Medicare Other | Source: Ambulatory Visit | Attending: Otolaryngology | Admitting: Otolaryngology

## 2018-12-05 ENCOUNTER — Other Ambulatory Visit: Payer: Self-pay

## 2018-12-05 DIAGNOSIS — E041 Nontoxic single thyroid nodule: Secondary | ICD-10-CM | POA: Diagnosis not present

## 2019-01-25 DIAGNOSIS — R310 Gross hematuria: Secondary | ICD-10-CM | POA: Insufficient documentation

## 2019-01-25 DIAGNOSIS — R3915 Urgency of urination: Secondary | ICD-10-CM | POA: Insufficient documentation

## 2019-01-25 DIAGNOSIS — Z803 Family history of malignant neoplasm of breast: Secondary | ICD-10-CM | POA: Insufficient documentation

## 2019-01-25 DIAGNOSIS — N362 Urethral caruncle: Secondary | ICD-10-CM | POA: Insufficient documentation

## 2019-01-25 DIAGNOSIS — IMO0001 Reserved for inherently not codable concepts without codable children: Secondary | ICD-10-CM | POA: Insufficient documentation

## 2019-01-25 DIAGNOSIS — R928 Other abnormal and inconclusive findings on diagnostic imaging of breast: Secondary | ICD-10-CM | POA: Insufficient documentation

## 2019-09-27 DIAGNOSIS — I82409 Acute embolism and thrombosis of unspecified deep veins of unspecified lower extremity: Secondary | ICD-10-CM

## 2019-09-27 HISTORY — DX: Acute embolism and thrombosis of unspecified deep veins of unspecified lower extremity: I82.409

## 2019-09-30 ENCOUNTER — Other Ambulatory Visit: Payer: Self-pay

## 2019-09-30 ENCOUNTER — Ambulatory Visit
Admission: EM | Admit: 2019-09-30 | Discharge: 2019-09-30 | Disposition: A | Discharge status: Home / Self Care | Payer: Medicare Other | Attending: Internal Medicine | Admitting: Internal Medicine

## 2019-09-30 ENCOUNTER — Ambulatory Visit: Payer: Medicare Other

## 2019-09-30 DIAGNOSIS — E538 Deficiency of other specified B group vitamins: Secondary | ICD-10-CM | POA: Insufficient documentation

## 2019-09-30 DIAGNOSIS — R509 Fever, unspecified: Secondary | ICD-10-CM | POA: Diagnosis present

## 2019-09-30 DIAGNOSIS — E559 Vitamin D deficiency, unspecified: Secondary | ICD-10-CM | POA: Insufficient documentation

## 2019-09-30 DIAGNOSIS — M545 Low back pain: Secondary | ICD-10-CM | POA: Diagnosis not present

## 2019-09-30 DIAGNOSIS — Z79899 Other long term (current) drug therapy: Secondary | ICD-10-CM | POA: Insufficient documentation

## 2019-09-30 DIAGNOSIS — Z20822 Contact with and (suspected) exposure to covid-19: Secondary | ICD-10-CM | POA: Diagnosis not present

## 2019-09-30 DIAGNOSIS — G8929 Other chronic pain: Secondary | ICD-10-CM | POA: Diagnosis not present

## 2019-09-30 DIAGNOSIS — I340 Nonrheumatic mitral (valve) insufficiency: Secondary | ICD-10-CM | POA: Diagnosis not present

## 2019-09-30 DIAGNOSIS — J189 Pneumonia, unspecified organism: Secondary | ICD-10-CM | POA: Diagnosis not present

## 2019-09-30 DIAGNOSIS — K5909 Other constipation: Secondary | ICD-10-CM | POA: Diagnosis not present

## 2019-09-30 DIAGNOSIS — M1990 Unspecified osteoarthritis, unspecified site: Secondary | ICD-10-CM | POA: Insufficient documentation

## 2019-09-30 DIAGNOSIS — R109 Unspecified abdominal pain: Secondary | ICD-10-CM | POA: Diagnosis not present

## 2019-09-30 DIAGNOSIS — R7303 Prediabetes: Secondary | ICD-10-CM | POA: Insufficient documentation

## 2019-09-30 LAB — URINALYSIS, COMPLETE (UACMP) WITH MICROSCOPIC
Bilirubin Urine: NEGATIVE
Glucose, UA: NEGATIVE mg/dL
Hgb urine dipstick: NEGATIVE
Ketones, ur: NEGATIVE mg/dL
Nitrite: NEGATIVE
Protein, ur: NEGATIVE mg/dL
Specific Gravity, Urine: 1.02 (ref 1.005–1.030)
pH: 7 (ref 5.0–8.0)

## 2019-09-30 LAB — CBC
HCT: 32.9 % — ABNORMAL LOW (ref 36.0–46.0)
Hemoglobin: 10.9 g/dL — ABNORMAL LOW (ref 12.0–15.0)
MCH: 28.2 pg (ref 26.0–34.0)
MCHC: 33.1 g/dL (ref 30.0–36.0)
MCV: 85 fL (ref 80.0–100.0)
Platelets: 231 10*3/uL (ref 150–400)
RBC: 3.87 MIL/uL (ref 3.87–5.11)
RDW: 14.4 % (ref 11.5–15.5)
WBC: 9.7 10*3/uL (ref 4.0–10.5)
nRBC: 0 % (ref 0.0–0.2)

## 2019-09-30 MED ORDER — DOXYCYCLINE HYCLATE 100 MG PO CAPS: 100.0000 mg | ORAL_CAPSULE | Freq: Two times a day (BID) | ORAL | 0 refills | Status: DC

## 2019-09-30 MED ORDER — HYDROCODONE-ACETAMINOPHEN 5-325 MG PO TABS: 1.0000 | ORAL_TABLET | Freq: Four times a day (QID) | ORAL | 0 refills | Status: DC | PRN

## 2019-09-30 MED ORDER — ONDANSETRON 4 MG PO TBDP: 4.0000 mg | ORAL_TABLET | Freq: Three times a day (TID) | ORAL | 0 refills | Status: DC | PRN

## 2019-09-30 MED ORDER — KETOROLAC TROMETHAMINE 60 MG/2ML IM SOLN
30.0000 mg | Freq: Once | INTRAMUSCULAR | Status: AC
  Administered 2019-09-30: 14:00:00 30 mg via INTRAMUSCULAR

## 2019-09-30 MED ORDER — CEFDINIR 300 MG PO CAPS: 300.0000 mg | ORAL_CAPSULE | Freq: Two times a day (BID) | ORAL | 0 refills | Status: DC

## 2019-09-30 NOTE — ED Provider Notes (Signed)
[facility]    CSN: [id number] Arrival date & time: 09/30/19  1231      History   Chief Complaint Chief Complaint  Patient presents with  . Headache  . Flank Pain  . Appointment    HPI Dana Bautista is a 74 y.o. female comes to urgent care with complaint of fever, headache and sharp left-sided chest and flank pain.  Symptoms started yesterday and is gotten progressively worse.  Pain is worse on taking a deep breath.  It is relieved when she sits still.  She has a cough which is minimally productive.  She had an episode of vomiting with no nausea.  Denies any dysuria, urgency or frequency.  No trauma to the back.  Patient has some chills.  Pain radiated across the left side of the back yesterday but that is improved.  She denies any sick contacts.  No diarrhea.  No loss of taste or smell.   HPI  Past Medical History:  Diagnosis Date  . Arthritis    osteoarthriist  . Cancer ([place])    skin  . Complication of anesthesia    nausea  . Heart murmur   . Mitral valve regurgitation   . Tendonitis    outer aspect of right foot  . UTI (lower urinary tract infection)     Patient Active Problem List   Diagnosis Date Noted  . Vitamin B 12 deficiency 08/10/2017  . Arm pain, left 04/25/2017  . Anemia due to vitamin B12 deficiency 04/13/2017  . Chronic constipation 02/25/2017  . Intermittent chest pain 11/20/2016  . Nausea 11/20/2016  . Right ear impacted cerumen 11/20/2016  . Carotid stenosis, asymptomatic, bilateral 10/06/2016  . Dizziness 06/13/2016  . H/O mitral valve prolapse 06/13/2016  . Neck muscle strain 06/13/2016  . Temporal headache 06/13/2016  . Vitamin D deficiency, unspecified 12/21/2015  . Chronic right-sided low back pain without sciatica 12/16/2015  . Meralgia paresthetica of right side 12/16/2015  . Prediabetes 12/16/2015  . Urge urinary incontinence 12/16/2015    Past Surgical History:  Procedure Laterality Date  . ABDOMINAL HYSTERECTOMY    .  APPENDECTOMY    . FRACTURE SURGERY     left wrist  . FRACTURE SURGERY Left    wrist  . TONSILLECTOMY      OB History   No obstetric history on file.      Home Medications    Prior to Admission medications   Medication Sig Start Date End Date Taking? Authorizing Provider  cefdinir (OMNICEF) 300 MG capsule Take 1 capsule (300 mg total) by mouth 2 (two) times daily for 5 days. 09/30/19 10/05/19  LampteyMyrene Galas, MD  doxycycline (VIBRAMYCIN) 100 MG capsule Take 1 capsule (100 mg total) by mouth 2 (two) times daily for 7 days. 09/30/19 10/07/19  LampteyMyrene Galas, MD  HYDROcodone-acetaminophen (NORCO/VICODIN) 5-325 MG tablet Take 1 tablet by mouth every 6 (six) hours as needed. 09/30/19   Allye Hoyos, Myrene Galas, MD  lubiprostone (AMITIZA) 24 MCG capsule Take 24 mcg by mouth 2 (two) times daily with a meal.    [provider]  ondansetron (ZOFRAN ODT) 4 MG disintegrating tablet Take 1 tablet (4 mg total) by mouth every 8 (eight) hours as needed for nausea or vomiting. 09/30/19   Honesti Seaberg, Myrene Galas, MD  vitamin B-12 (CYANOCOBALAMIN) 1000 MCG tablet Take 1,000 mcg by mouth daily.    [provider]  VITAMIN D, CHOLECALCIFEROL, PO Take by mouth.    [provider]  Vitamin D, Ergocalciferol, (DRISDOL) 50000 units CAPS capsule Take 50,000 Units by mouth every 7 (seven) days.    [provider]    Family History Family History  Problem Relation Age of Onset  . Breast cancer Mother   . Asthma Mother   . Lung cancer Father   . Heart disease Brother   . Heart disease Maternal Grandfather   . Throat cancer Maternal Grandfather     Social History Social History   Tobacco Use  . Smoking status: Never Smoker  . Smokeless tobacco: Never Used  Substance Use Topics  . Alcohol use: No  . Drug use: No     Allergies   Codeine, Levaquin [levofloxacin], Penicillins, Sulfur, and Latex   Review of Systems Review of Systems  Constitutional: Positive for activity  change, chills, fatigue and fever.  HENT: Negative for congestion, sinus pressure, sinus pain and sore throat.   Respiratory: Positive for cough and shortness of breath. Negative for chest tightness.   Cardiovascular: Positive for chest pain. Negative for palpitations.  Gastrointestinal: Negative for abdominal pain, nausea and vomiting.  Genitourinary: Positive for flank pain. Negative for dysuria and urgency.  Musculoskeletal: Positive for back pain. Negative for arthralgias, joint swelling and myalgias.  Skin: Negative.   Neurological: Negative for dizziness and headaches.  Psychiatric/Behavioral: Negative for confusion and decreased concentration.     Physical Exam Triage Vital Signs ED Triage Vitals  Enc Vitals Group     BP 09/30/19 1241 (!) 142/75     Pulse Rate 09/30/19 1241 90     Resp 09/30/19 1241 18     Temp 09/30/19 1241 99.1 F (37.3 C)     Temp Source 09/30/19 1241 Oral     SpO2 09/30/19 1241 97 %     Weight 09/30/19 1241 180 lb (81.6 kg)     Height 09/30/19 1241 5\' 6"  (1.676 m)     Head Circumference --      Peak Flow --      Pain Score 09/30/19 1240 6     Pain Loc --      Pain Edu? --      Excl. in [place]? --    No data found.  Updated Vital Signs BP (!) 142/75 (BP Location: Right Arm)   Pulse 90   Temp 99.1 F (37.3 C) (Oral)   Resp 18   Ht 5\' 6"  (1.676 m)   Wt 81.6 kg   SpO2 97%   BMI 29.05 kg/m   Visual Acuity Right Eye Distance:   Left Eye Distance:   Bilateral Distance:    Right Eye Near:   Left Eye Near:    Bilateral Near:     Physical Exam Vitals and nursing note reviewed.  Constitutional:      General: She is in acute distress.     Appearance: She is ill-appearing.  Eyes:     Extraocular Movements: Extraocular movements intact.     Right eye: Normal extraocular motion.     Left eye: Normal extraocular motion.     Pupils: Pupils are equal, round, and reactive to light.  Cardiovascular:     Rate and Rhythm: Normal rate and regular  rhythm.  Pulmonary:     Effort: Respiratory distress present.     Breath sounds: Rhonchi and rales present.  Chest:     Chest wall: No tenderness.  Abdominal:     General: Bowel sounds are normal. There is no distension.     Palpations: Abdomen is  soft.     Tenderness: There is no abdominal tenderness.  Musculoskeletal:        General: No swelling or tenderness. Normal range of motion.     Cervical back: Normal range of motion and neck supple.  Skin:    General: Skin is warm and dry.     Capillary Refill: Capillary refill takes less than 2 seconds.  Neurological:     Mental Status: She is alert and oriented to person, place, and time.     GCS: GCS eye subscore is 4. GCS verbal subscore is 5. GCS motor subscore is 6.      UC Treatments / Results  Labs (all labs ordered are listed, but only abnormal results are displayed) Labs Reviewed  URINALYSIS, COMPLETE (UACMP) WITH MICROSCOPIC - Abnormal; Notable for the following components:      Result Value   Leukocytes,Ua TRACE (*)    Non Squamous Epithelial PRESENT (*)    Bacteria, UA FEW (*)    All other components within normal limits  CBC - Abnormal; Notable for the following components:   Hemoglobin 10.9 (*)    HCT 32.9 (*)    All other components within normal limits  NOVEL CORONAVIRUS, NAA (HOSP ORDER, SEND-OUT TO REF LAB; TAT 18-24 HRS)  URINE CULTURE    EKG   Radiology DG Chest 2 View  Result Date: 09/30/2019 CLINICAL DATA:  Chest pain. EXAM: CHEST - 2 VIEW COMPARISON:  PA and lateral chest 11/02/2018 and 08/26/2015. FINDINGS: Linear left basilar scarring is unchanged. Lungs otherwise clear. No pneumothorax or pleural effusion. Heart size is normal. No acute or focal bony abnormality. Mild, remote midthoracic compression fracture is unchanged. IMPRESSION: No acute disease.  Stable compared to prior exam. Electronically Signed   By: Inge Rise M.D.   On: 09/30/2019 14:05    Procedures Procedures (including  critical care time)  Medications Ordered in UC Medications  ketorolac (TORADOL) injection 30 mg (30 mg Intramuscular Given 09/30/19 1342)    Initial Impression / Assessment and Plan / UC Course  I have reviewed the triage vital signs and the nursing notes.  Pertinent labs & imaging results that were available during my care of the patient were reviewed by me and considered in my medical decision making (see chart for details).     1.  Left-sided chest pain-suspected pneumonia: Chest x-ray Urinalysis is significant for trace leukocyte esterase and few bacteria with WBC 6-10 Urine culture sent Toradol 30 mg IM x1 dose COVID-19 PCR has been sent Patient is advised to self isolate until COVID-19 test results are available Patient symptoms worsen advised to return to the urgent care to be reevaluated.  Final Clinical Impressions(s) / UC Diagnoses   Final diagnoses:  Pneumonia of left lower lobe due to infectious organism   Discharge Instructions   None    ED Prescriptions    Medication Sig Dispense Auth. Provider   cefdinir (OMNICEF) 300 MG capsule Take 1 capsule (300 mg total) by mouth 2 (two) times daily for 5 days. 10 capsule Analei Whinery, Myrene Galas, MD   doxycycline (VIBRAMYCIN) 100 MG capsule Take 1 capsule (100 mg total) by mouth 2 (two) times daily for 7 days. 14 capsule Haidyn Kilburg, Myrene Galas, MD   HYDROcodone-acetaminophen (NORCO/VICODIN) 5-325 MG tablet Take 1 tablet by mouth every 6 (six) hours as needed. 10 tablet Kayren Holck, Myrene Galas, MD   ondansetron (ZOFRAN ODT) 4 MG disintegrating tablet Take 1 tablet (4 mg total) by mouth every 8 (eight) hours  as needed for nausea or vomiting. 20 tablet Marty Sadlowski, Myrene Galas, MD     I have reviewed the PDMP during this encounter.   Chase Picket, MD 09/30/19 [phone]

## 2019-09-30 NOTE — ED Triage Notes (Addendum)
Pt started yesterday with headache, low grade fever, left side pain. Pain to left flank much worse when she takes a deep breath. Pain went to her left neck yesterday but was brief and hasn't happened since

## 2019-10-01 LAB — NOVEL CORONAVIRUS, NAA (HOSP ORDER, SEND-OUT TO REF LAB; TAT 18-24 HRS): SARS-CoV-2, NAA: NOT DETECTED

## 2019-10-01 LAB — URINE CULTURE: Culture: <10000 — AB

## 2019-10-04 ENCOUNTER — Other Ambulatory Visit: Payer: Self-pay

## 2019-10-04 ENCOUNTER — Observation Stay
Admission: EM | Admit: 2019-10-04 | Discharge: 2019-10-05 | Disposition: A | Discharge status: Home / Self Care | Payer: Medicare Other | Attending: Internal Medicine | Admitting: Internal Medicine

## 2019-10-04 ENCOUNTER — Other Ambulatory Visit: Payer: Self-pay | Admitting: Physician Assistant

## 2019-10-04 ENCOUNTER — Ambulatory Visit
Admission: RE | Admit: 2019-10-04 | Discharge: 2019-10-04 | Disposition: A | Discharge status: Home / Self Care | Payer: Medicare Other | Source: Ambulatory Visit | Attending: Physician Assistant | Admitting: Physician Assistant

## 2019-10-04 DIAGNOSIS — Z79899 Other long term (current) drug therapy: Secondary | ICD-10-CM | POA: Insufficient documentation

## 2019-10-04 DIAGNOSIS — Z880 Allergy status to penicillin: Secondary | ICD-10-CM | POA: Insufficient documentation

## 2019-10-04 DIAGNOSIS — E538 Deficiency of other specified B group vitamins: Secondary | ICD-10-CM | POA: Diagnosis present

## 2019-10-04 DIAGNOSIS — I824Z1 Acute embolism and thrombosis of unspecified deep veins of right distal lower extremity: Secondary | ICD-10-CM | POA: Diagnosis present

## 2019-10-04 DIAGNOSIS — J90 Pleural effusion, not elsewhere classified: Secondary | ICD-10-CM | POA: Diagnosis present

## 2019-10-04 DIAGNOSIS — R0602 Shortness of breath: Secondary | ICD-10-CM

## 2019-10-04 DIAGNOSIS — R7303 Prediabetes: Secondary | ICD-10-CM | POA: Diagnosis not present

## 2019-10-04 DIAGNOSIS — I313 Pericardial effusion (noninflammatory): Secondary | ICD-10-CM | POA: Insufficient documentation

## 2019-10-04 DIAGNOSIS — I82441 Acute embolism and thrombosis of right tibial vein: Secondary | ICD-10-CM | POA: Diagnosis not present

## 2019-10-04 DIAGNOSIS — R071 Chest pain on breathing: Secondary | ICD-10-CM | POA: Insufficient documentation

## 2019-10-04 DIAGNOSIS — I2699 Other pulmonary embolism without acute cor pulmonale: Secondary | ICD-10-CM

## 2019-10-04 DIAGNOSIS — M1990 Unspecified osteoarthritis, unspecified site: Secondary | ICD-10-CM | POA: Insufficient documentation

## 2019-10-04 DIAGNOSIS — Z885 Allergy status to narcotic agent status: Secondary | ICD-10-CM | POA: Insufficient documentation

## 2019-10-04 DIAGNOSIS — Z20822 Contact with and (suspected) exposure to covid-19: Secondary | ICD-10-CM | POA: Insufficient documentation

## 2019-10-04 DIAGNOSIS — I3139 Other pericardial effusion (noninflammatory): Secondary | ICD-10-CM | POA: Diagnosis present

## 2019-10-04 DIAGNOSIS — R0781 Pleurodynia: Secondary | ICD-10-CM | POA: Diagnosis present

## 2019-10-04 DIAGNOSIS — I2694 Multiple subsegmental pulmonary emboli without acute cor pulmonale: Secondary | ICD-10-CM | POA: Diagnosis not present

## 2019-10-04 DIAGNOSIS — Z882 Allergy status to sulfonamides status: Secondary | ICD-10-CM | POA: Insufficient documentation

## 2019-10-04 DIAGNOSIS — Z881 Allergy status to other antibiotic agents status: Secondary | ICD-10-CM | POA: Insufficient documentation

## 2019-10-04 LAB — CBC WITH DIFFERENTIAL/PLATELET
Abs Immature Granulocytes: 0.03 10*3/uL (ref 0.00–0.07)
Basophils Absolute: 0 10*3/uL (ref 0.0–0.1)
Basophils Relative: 0 %
Eosinophils Absolute: 0 10*3/uL (ref 0.0–0.5)
Eosinophils Relative: 0 %
HCT: 34.7 % — ABNORMAL LOW (ref 36.0–46.0)
Hemoglobin: 11.1 g/dL — ABNORMAL LOW (ref 12.0–15.0)
Immature Granulocytes: 0 %
Lymphocytes Relative: 23 %
Lymphs Abs: 2.1 10*3/uL (ref 0.7–4.0)
MCH: 27.9 pg (ref 26.0–34.0)
MCHC: 32 g/dL (ref 30.0–36.0)
MCV: 87.2 fL (ref 80.0–100.0)
Monocytes Absolute: 0.9 10*3/uL (ref 0.1–1.0)
Monocytes Relative: 10 %
Neutro Abs: 6.1 10*3/uL (ref 1.7–7.7)
Neutrophils Relative %: 67 %
Platelets: 342 10*3/uL (ref 150–400)
RBC: 3.98 MIL/uL (ref 3.87–5.11)
RDW: 13.5 % (ref 11.5–15.5)
WBC: 9.1 10*3/uL (ref 4.0–10.5)
nRBC: 0 % (ref 0.0–0.2)

## 2019-10-04 LAB — PROTIME-INR
INR: 1 (ref 0.8–1.2)
Prothrombin Time: 13.4 seconds (ref 11.4–15.2)

## 2019-10-04 LAB — COMPREHENSIVE METABOLIC PANEL
ALT: 8 U/L (ref 0–44)
AST: 10 U/L — ABNORMAL LOW (ref 15–41)
Albumin: 3.7 g/dL (ref 3.5–5.0)
Alkaline Phosphatase: 64 U/L (ref 38–126)
Anion gap: 9 (ref 5–15)
BUN: 10 mg/dL (ref 8–23)
CO2: 28 mmol/L (ref 22–32)
Calcium: 9.2 mg/dL (ref 8.9–10.3)
Chloride: 96 mmol/L — ABNORMAL LOW (ref 98–111)
Creatinine, Ser: 0.62 mg/dL (ref 0.44–1.00)
GFR calc Af Amer: >60 mL/min (ref >60)
GFR calc non Af Amer: >60 mL/min (ref >60)
Glucose, Bld: 123 mg/dL — ABNORMAL HIGH (ref 70–99)
Potassium: 3.6 mmol/L (ref 3.5–5.1)
Sodium: 133 mmol/L — ABNORMAL LOW (ref 135–145)
Total Bilirubin: 1.2 mg/dL (ref 0.3–1.2)
Total Protein: 7.6 g/dL (ref 6.5–8.1)

## 2019-10-04 LAB — FERRITIN: Ferritin: 176 ng/mL (ref 11–307)

## 2019-10-04 LAB — RESPIRATORY PANEL BY RT PCR (FLU A&B, COVID)
Influenza A by PCR: NEGATIVE
Influenza B by PCR: NEGATIVE
SARS Coronavirus 2 by RT PCR: NEGATIVE

## 2019-10-04 LAB — APTT: aPTT: 33 seconds (ref 24–36)

## 2019-10-04 LAB — POCT I-STAT CREATININE: Creatinine, Ser: 0.5 mg/dL (ref 0.44–1.00)

## 2019-10-04 LAB — TROPONIN I (HIGH SENSITIVITY)
Troponin I (High Sensitivity): 3 ng/L (ref <18)
Troponin I (High Sensitivity): 4 ng/L (ref <18)

## 2019-10-04 LAB — SEDIMENTATION RATE: Sed Rate: 83 mm/hr — ABNORMAL HIGH (ref 0–30)

## 2019-10-04 MED ORDER — SODIUM CHLORIDE 0.9% FLUSH: 3.0000 mL | Freq: Once | INTRAVENOUS | Status: DC

## 2019-10-04 MED ORDER — IOHEXOL 300 MG/ML  SOLN
75.0000 mL | Freq: Once | INTRAMUSCULAR | Status: AC | PRN
  Administered 2019-10-04: 17:00:00 75 mL via INTRAVENOUS

## 2019-10-04 MED ORDER — ACETAMINOPHEN 325 MG PO TABS
650.0000 mg | ORAL_TABLET | Freq: Four times a day (QID) | ORAL | Status: DC | PRN
  Administered 2019-10-05 (×2): 650 mg via ORAL
  Filled 2019-10-04 (×2): qty 2

## 2019-10-04 MED ORDER — ONDANSETRON HCL 4 MG/2ML IJ SOLN
4.0000 mg | Freq: Once | INTRAMUSCULAR | Status: AC
  Administered 2019-10-04: 4 mg via INTRAVENOUS
  Filled 2019-10-04: qty 2

## 2019-10-04 MED ORDER — HEPARIN BOLUS VIA INFUSION
4000.0000 [IU] | Freq: Once | INTRAVENOUS | Status: AC
  Administered 2019-10-04: 4000 [IU] via INTRAVENOUS
  Filled 2019-10-04: qty 4000

## 2019-10-04 MED ORDER — HEPARIN (PORCINE) 25000 UT/250ML-% IV SOLN
1450.0000 [IU]/h | INTRAVENOUS | Status: DC
  Administered 2019-10-04: 1300 [IU]/h via INTRAVENOUS
  Administered 2019-10-05: 1450 [IU]/h via INTRAVENOUS
  Filled 2019-10-04 (×2): qty 250

## 2019-10-04 MED ORDER — ACETAMINOPHEN 650 MG RE SUPP: 650.0000 mg | Freq: Four times a day (QID) | RECTAL | Status: DC | PRN

## 2019-10-04 MED ORDER — SENNOSIDES-DOCUSATE SODIUM 8.6-50 MG PO TABS: 1.0000 | ORAL_TABLET | Freq: Every evening | ORAL | Status: DC | PRN

## 2019-10-04 MED ORDER — MORPHINE SULFATE (PF) 4 MG/ML IV SOLN
4.0000 mg | Freq: Once | INTRAVENOUS | Status: AC
  Administered 2019-10-04: 4 mg via INTRAVENOUS
  Filled 2019-10-04: qty 1

## 2019-10-04 NOTE — ED Provider Notes (Signed)
[facility] Emergency Department Provider Note       Time seen: ----------------------------------------- 7:24 PM on 10/04/2019 ----------------------------------------- I have reviewed the triage vital signs and the nursing notes.  HISTORY   Chief Complaint Shortness of Breath   HPI Dana Bautista is a 74 y.o. female with a history of arthritis, tendinitis, UTI, skin cancer who presents to the ED for general ill feeling with sharp left-sided pleuritic chest pain that worsened into a bilateral chest pain that was pleuritic.  She was tested for Covid Monday and was negative.  She has had a low-grade fever, she denies vomiting or diarrhea.  Past Medical History:  Diagnosis Date  . Arthritis    osteoarthriist  . Cancer ([place])    skin  . Complication of anesthesia    nausea  . Heart murmur   . Mitral valve regurgitation   . Tendonitis    outer aspect of right foot  . UTI (lower urinary tract infection)     Patient Active Problem List   Diagnosis Date Noted  . Vitamin B 12 deficiency 08/10/2017  . Arm pain, left 04/25/2017  . Anemia due to vitamin B12 deficiency 04/13/2017  . Chronic constipation 02/25/2017  . Intermittent chest pain 11/20/2016  . Nausea 11/20/2016  . Right ear impacted cerumen 11/20/2016  . Carotid stenosis, asymptomatic, bilateral 10/06/2016  . Dizziness 06/13/2016  . H/O mitral valve prolapse 06/13/2016  . Neck muscle strain 06/13/2016  . Temporal headache 06/13/2016  . Vitamin D deficiency, unspecified 12/21/2015  . Chronic right-sided low back pain without sciatica 12/16/2015  . Meralgia paresthetica of right side 12/16/2015  . Prediabetes 12/16/2015  . Urge urinary incontinence 12/16/2015    Past Surgical History:  Procedure Laterality Date  . ABDOMINAL HYSTERECTOMY    . APPENDECTOMY    . FRACTURE SURGERY     left wrist  . FRACTURE SURGERY Left    wrist  . TONSILLECTOMY      Allergies Codeine, Levaquin  [levofloxacin], Penicillins, Sulfur, and Latex  Social History Social History   Tobacco Use  . Smoking status: Never Smoker  . Smokeless tobacco: Never Used  Substance Use Topics  . Alcohol use: No  . Drug use: No   Review of Systems Constitutional: Negative for fever. Cardiovascular: Positive for chest pain Respiratory: Positive for shortness of breath Gastrointestinal: Negative for abdominal pain, vomiting and diarrhea. Musculoskeletal: Negative for back pain. Skin: Negative for rash. Neurological: Negative for headaches, focal weakness or numbness.  All systems negative/normal/unremarkable except as stated in the HPI  ____________________________________________   PHYSICAL EXAM:  VITAL SIGNS: ED Triage Vitals  Enc Vitals Group     BP 10/04/19 1823 (!) 147/69     Pulse Rate 10/04/19 1823 88     Resp 10/04/19 1823 (!) 22     Temp 10/04/19 1823 99 F (37.2 C)     Temp Source 10/04/19 1823 Oral     SpO2 10/04/19 1823 100 %     Weight 10/04/19 1824 178 lb 9.2 oz (81 kg)     Height 10/04/19 1824 5\' 6"  (1.676 m)     Head Circumference --      Peak Flow --      Pain Score 10/04/19 1824 10     Pain Loc --      Pain Edu? --      Excl. in [place]? --    Constitutional: Alert and oriented.  Mild distress Eyes: Conjunctivae are normal. Normal extraocular movements. ENT  Head: Normocephalic and atraumatic.      Nose: No congestion/rhinnorhea.      Mouth/Throat: Mucous membranes are moist.      Neck: No stridor. Cardiovascular: Normal rate, regular rhythm. No murmurs, rubs, or gallops. Respiratory: Normal respiratory effort without tachypnea nor retractions. Breath sounds are clear and equal bilaterally. No wheezes/rales/rhonchi. Gastrointestinal: Soft and nontender. Normal bowel sounds Musculoskeletal: Nontender with normal range of motion in extremities. No lower extremity tenderness nor edema. Neurologic:  Normal speech and language. No gross focal neurologic deficits  are appreciated.  Skin:  Skin is warm, dry and intact. No rash noted. Psychiatric: Mood and affect are normal. Speech and behavior are normal.  ____________________________________________  EKG: Interpreted by me.  Sinus rhythm with rate of 89 bpm, normal PR interval, normal QRS, normal QT  ____________________________________________  ED COURSE:  As part of my medical decision making, I reviewed the following data within the [place] History obtained from family if available, nursing notes, old chart and ekg, as well as notes from prior ED visits. Patient presented for dyspnea with positive CT chest for PE today, we will assess with labs and imaging as indicated at this time.   Procedures  [name] was evaluated in Emergency Department on 10/04/2019 for the symptoms described in the history of present illness. She was evaluated in the context of the global COVID-19 pandemic, which necessitated consideration that the patient might be at risk for infection with the SARS-CoV-2 virus that causes COVID-19. Institutional protocols and algorithms that pertain to the evaluation of patients at risk for COVID-19 are in a state of rapid change based on information released by regulatory bodies including the CDC and federal and state organizations. These policies and algorithms were followed during the patient's care in the ED.  ____________________________________________   LABS (pertinent positives/negatives)  Labs Reviewed  CBC WITH DIFFERENTIAL/PLATELET - Abnormal; Notable for the following components:      Result Value   Hemoglobin 11.1 (*)    HCT 34.7 (*)    All other components within normal limits  COMPREHENSIVE METABOLIC PANEL - Abnormal; Notable for the following components:   Sodium 133 (*)    Chloride 96 (*)    Glucose, Bld 123 (*)    AST 10 (*)    All other components within normal limits  RESPIRATORY PANEL BY RT PCR (FLU A&B, COVID)  PROTIME-INR  APTT  TROPONIN  I (HIGH SENSITIVITY)    RADIOLOGY Images were viewed by me CTA IMPRESSION: 1. Acute bilateral lower lobe pulmonary emboli as detailed above. No right heart strain. Small left pleural effusion. 2. Small pericardial effusion.  Critical Value/emergent results were called by telephone at the time of interpretation on 10/04/2019 at 6:09 pm to providerDr. Ola Spurr, who verbally acknowledged these results.   ____________________________________________   DIFFERENTIAL DIAGNOSIS   PE, DVT, COVID-19, pneumonia  FINAL ASSESSMENT AND PLAN  PE, pleural effusion, pericardial effusion   Plan: The patient had presented for shortness of breath. Patient's labs were grossly unremarkable. Patient's imaging did reveal acute bilateral lower lobe PEs with no evidence of heart strain.  She does however have a small left pleural effusion and a small pericardial effusion.  I have placed her on heparin, given morphine for pain.  I will discuss with the hospitalist for admission.   Laurence Aly, MD    Note: This note was generated in part or whole with voice recognition software. Voice recognition is usually quite accurate but there are transcription  errors that can and very often do occur. I apologize for any typographical errors that were not detected and corrected.     Earleen Newport, MD 10/04/19 Sharilyn Sites

## 2019-10-04 NOTE — Progress Notes (Signed)
ANTICOAGULATION CONSULT NOTE - Initial Consult  Pharmacy Consult for heparin  Indication: pulmonary embolus  Allergies  Allergen Reactions  . Codeine Nausea And Vomiting  . Levaquin [Levofloxacin] Hives  . Penicillins Hives  . Sulfur Other (See Comments)    Lip swelling, difficulty breathing  . Latex Rash    Patient Measurements: Height: 5\' 6"  (167.6 cm) Weight: 178 lb 9.2 oz (81 kg) IBW/kg (Calculated) : 59.3 Heparin Dosing Weight: 76.2 kg  Vital Signs: Temp: 99 F (37.2 C) (01/08 1823) Temp Source: Oral (01/08 1823) BP: 147/69 (01/08 1823) Pulse Rate: 88 (01/08 1823)  Labs: Recent Labs    10/04/19 1649 10/04/19 1832  HGB  --  11.1*  HCT  --  34.7*  PLT  --  342  APTT  --  33  LABPROT  --  13.4  INR  --  1.0  CREATININE 0.50 0.62  TROPONINIHS  --  3    Estimated Creatinine Clearance: 69.2 mL/min (by C-G formula based on SCr of 0.62 mg/dL).   Assessment: 74 yo female with acute bilateral PEs on outpt CT. No right heart strain.  No oral anticoagulants noted on PTA med list.   Goal of Therapy:  Heparin level 0.3-0.7 units/ml Monitor platelets by anticoagulation protocol: Yes   Plan:  Heparin bolus 4000 units IV x1 then heparin drip at 1300 units/hr HL in 8h after start of drip and CBC in AM  Pharmacy will continue to follow.   Rocky Morel 10/04/2019,7:20 PM

## 2019-10-04 NOTE — H&P (Signed)
History and Physical    Dana Bautista [id number] DOB: Mar 27, 1946 DOA: 10/04/2019  PCP: System, Pcp Not In   Patient coming from: home  I have personally briefly reviewed patient's old medical records in [place]  Chief Complaint: chest pain  HPI: Dana Bautista is a 74 y.o. female with no significant past medical history who presented from the outpatient setting diagnosis of bilateral PE on CTA.  Patient developed chest pain several days ago described as sharp that started on the left side and then extended to the right.  She went to the outpatient and was tested for Covid which was negative.  Due to continued symptoms she had a CTA chest on the day of arrival that showed bilateral PE of the lower lobes as well as a small pericardial and small pleural effusion  ED Course: On arrival to the emergency room she had a temperature of 99, blood pressure 147/68 heart rate 88 respirations 22 with O2 sat 100% on room air troponin was 3 and EKG showed normal sinus rhythm.  Other blood work was for the most part unremarkable.  Covid test being repeated in the emergency room.  Patient was started on a heparin bolus and hospitalist consulted for admission.  Review of Systems: As per HPI otherwise 10 point review of systems negative.    Past Medical History:  Diagnosis Date   Arthritis    osteoarthriist   Cancer ([place])    skin   Complication of anesthesia    nausea   Heart murmur    Mitral valve regurgitation    Tendonitis    outer aspect of right foot   UTI (lower urinary tract infection)     Past Surgical History:  Procedure Laterality Date   ABDOMINAL HYSTERECTOMY     APPENDECTOMY     FRACTURE SURGERY     left wrist   FRACTURE SURGERY Left    wrist   TONSILLECTOMY       reports that she has never smoked. She has never used smokeless tobacco. She reports that she does not drink alcohol or use drugs.  Allergies  Allergen Reactions   Codeine Nausea And Vomiting   Levaquin  [Levofloxacin] Hives   Penicillins Hives   Sulfur Other (See Comments)    Lip swelling, difficulty breathing   Latex Rash    Family History  Problem Relation Age of Onset   Breast cancer Mother    Asthma Mother    Lung cancer Father    Heart disease Brother    Heart disease Maternal Grandfather    Throat cancer Maternal Grandfather      Prior to Admission medications   Medication Sig Start Date End Date Taking? Authorizing Provider  cefdinir (OMNICEF) 300 MG capsule Take 1 capsule (300 mg total) by mouth 2 (two) times daily for 5 days. 09/30/19 10/05/19  LampteyMyrene Galas, MD  doxycycline (VIBRAMYCIN) 100 MG capsule Take 1 capsule (100 mg total) by mouth 2 (two) times daily for 7 days. 09/30/19 10/07/19  LampteyMyrene Galas, MD  HYDROcodone-acetaminophen (NORCO/VICODIN) 5-325 MG tablet Take 1 tablet by mouth every 6 (six) hours as needed. 09/30/19   Lamptey, Myrene Galas, MD  lubiprostone (AMITIZA) 24 MCG capsule Take 24 mcg by mouth 2 (two) times daily with a meal.    [provider]  ondansetron (ZOFRAN ODT) 4 MG disintegrating tablet Take 1 tablet (4 mg total) by mouth every 8 (eight) hours as needed for nausea or vomiting. 09/30/19   Lamptey,  Myrene Galas, MD  vitamin B-12 (CYANOCOBALAMIN) 1000 MCG tablet Take 1,000 mcg by mouth daily.    [provider]  VITAMIN D, CHOLECALCIFEROL, PO Take by mouth.    [provider]  Vitamin D, Ergocalciferol, (DRISDOL) 50000 units CAPS capsule Take 50,000 Units by mouth every 7 (seven) days.    [provider]    Physical Exam: Vitals:   10/04/19 1823 10/04/19 1824  BP: (!) 147/69   Pulse: 88   Resp: (!) 22   Temp: 99 F (37.2 C)   TempSrc: Oral   SpO2: 100%   Weight:  81 kg  Height:  5\' 6"  (1.676 m)     Vitals:   10/04/19 1823 10/04/19 1824  BP: (!) 147/69   Pulse: 88   Resp: (!) 22   Temp: 99 F (37.2 C)   TempSrc: Oral   SpO2: 100%   Weight:  81 kg  Height:  5\' 6"  (1.676 m)    Constitutional: NAD,  alert and oriented x 3 Eyes: PERRL, lids and conjunctivae normal ENMT: Mucous membranes are moist.  Neck: normal, supple, no masses, no thyromegaly Respiratory: clear to auscultation bilaterally, no wheezing, no crackles. Normal respiratory effort. No accessory muscle use.  Cardiovascular: Regular rate and rhythm, no murmurs / rubs / gallops. No extremity edema. 2+ pedal pulses. No carotid bruits.  Abdomen: no tenderness, no masses palpated. No hepatosplenomegaly. Bowel sounds positive.  Musculoskeletal: no clubbing / cyanosis. No joint deformity upper and lower extremities.  Skin: no rashes, lesions, ulcers.  Neurologic: No gross focal neurologic deficit. Psychiatric: Normal mood and affect.   Labs on Admission: I have personally reviewed following labs and imaging studies  CBC: Recent Labs  Lab 09/30/19 1347 10/04/19 1832  WBC 9.7 9.1  NEUTROABS  --  6.1  HGB 10.9* 11.1*  HCT 32.9* 34.7*  MCV 85.0 87.2  PLT 231 [id number]   Basic Metabolic Panel: Recent Labs  Lab 10/04/19 1649 10/04/19 1832  NA  --  133*  K  --  3.6  CL  --  96*  CO2  --  28  GLUCOSE  --  123*  BUN  --  10  CREATININE 0.50 0.62  CALCIUM  --  9.2   GFR: Estimated Creatinine Clearance: 69.2 mL/min (by C-G formula based on SCr of 0.62 mg/dL). Liver Function Tests: Recent Labs  Lab 10/04/19 1832  AST 10*  ALT 8  ALKPHOS 64  BILITOT 1.2  PROT 7.6  ALBUMIN 3.7   No results for input(s): LIPASE, AMYLASE in the last 168 hours. No results for input(s): AMMONIA in the last 168 hours. Coagulation Profile: Recent Labs  Lab 10/04/19 1832  INR 1.0   Cardiac Enzymes: No results for input(s): CKTOTAL, CKMB, CKMBINDEX, TROPONINI in the last 168 hours. BNP (last 3 results) No results for input(s): PROBNP in the last 8760 hours. HbA1C: No results for input(s): HGBA1C in the last 72 hours. CBG: No results for input(s): GLUCAP in the last 168 hours. Lipid Profile: No results for input(s): CHOL, HDL,  LDLCALC, TRIG, CHOLHDL, LDLDIRECT in the last 72 hours. Thyroid Function Tests: No results for input(s): TSH, T4TOTAL, FREET4, T3FREE, THYROIDAB in the last 72 hours. Anemia Panel: No results for input(s): VITAMINB12, FOLATE, FERRITIN, TIBC, IRON, RETICCTPCT in the last 72 hours. Urine analysis:    Component Value Date/Time   COLORURINE YELLOW 09/30/2019 1309   APPEARANCEUR CLEAR 09/30/2019 1309   LABSPEC 1.020 09/30/2019 1309   PHURINE 7.0 09/30/2019 1309  GLUCOSEU NEGATIVE 09/30/2019 1309   HGBUR NEGATIVE 09/30/2019 1309   BILIRUBINUR NEGATIVE 09/30/2019 1309   KETONESUR NEGATIVE 09/30/2019 1309   PROTEINUR NEGATIVE 09/30/2019 1309   NITRITE NEGATIVE 09/30/2019 1309   LEUKOCYTESUR TRACE (A) 09/30/2019 1309    Radiological Exams on Admission: CT ANGIO CHEST PE W OR WO CONTRAST  Result Date: 10/04/2019 CLINICAL DATA:  Left-sided chest pain since Monday EXAM: CT ANGIOGRAPHY CHEST WITH CONTRAST TECHNIQUE: Multidetector CT imaging of the chest was performed using the standard protocol during bolus administration of intravenous contrast. Multiplanar CT image reconstructions and MIPs were obtained to evaluate the vascular anatomy. CONTRAST:  8mL OMNIPAQUE IOHEXOL 300 MG/ML  SOLN COMPARISON:  None. FINDINGS: Cardiovascular: Satisfactory opacification of the pulmonary arteries to the segmental level. Small occlusive left lower lobe segmental pulmonary embolus. Small nonocclusive right lower lobe subsegmental pulmonary embolus. Normal heart size. Small pericardial effusion. Mediastinum/Nodes: No enlarged mediastinal, hilar, or axillary lymph nodes. Thyroid gland, trachea, and esophagus demonstrate no significant findings. Lungs/Pleura: Right lung is clear. No right pleural effusion or pneumothorax. Small left pleural effusion. Left basilar atelectasis. Upper Abdomen: No acute abnormality. Musculoskeletal: No chest wall abnormality. No acute or significant osseous findings. Chronic T8 vertebral body  compression fracture. Soft tissue: Bilateral breast implants. Left intracapsular and extracapsular implant rupture. Review of the MIP images confirms the above findings. IMPRESSION: 1. Acute bilateral lower lobe pulmonary emboli as detailed above. No right heart strain. Small left pleural effusion. 2. Small pericardial effusion. Critical Value/emergent results were called by telephone at the time of interpretation on 10/04/2019 at 6:09 pm to providerDr. Ola Spurr, who verbally acknowledged these results. Electronically Signed   By: Kathreen Devoid   On: 10/04/2019 17:14    EKG: Independently reviewed.   Assessment/Plan Active Problems:   Bilateral pulmonary embolism (HCC) --PE appears unprovoked --We will need work-up for thrombogenic condition including malignancy --Repeat Covid test is appropriate.  Tested negative on 09/30/2019 --Follow inflammatory markers     Pericardial effusion, on CT -Small pericardial effusion on CT -Echocardiogram    Pleural effusion, small on CT -Management as above      DVT prophylaxis: on full dose anticoagulation  Code Status: full code  Family Communication: none  Disposition Plan: Back to previous home environment Consults called: none     Athena Masse MD [facility]     10/04/2019, 8:13 PM

## 2019-10-04 NOTE — ED Triage Notes (Signed)
Pt comes in from outpatient CT with dx bilateral PE's. Pt has been feeling bad since Sunday. Pain started on her left side then became bilateral. Pt tested for covid Monday and was negative.

## 2019-10-05 ENCOUNTER — Observation Stay
Admit: 2019-10-05 | Discharge: 2019-10-05 | Disposition: A | Discharge status: Home / Self Care | Payer: Medicare Other | Attending: Internal Medicine | Admitting: Internal Medicine

## 2019-10-05 ENCOUNTER — Observation Stay: Payer: Medicare Other

## 2019-10-05 DIAGNOSIS — J90 Pleural effusion, not elsewhere classified: Secondary | ICD-10-CM | POA: Diagnosis not present

## 2019-10-05 DIAGNOSIS — I824Z1 Acute embolism and thrombosis of unspecified deep veins of right distal lower extremity: Secondary | ICD-10-CM

## 2019-10-05 DIAGNOSIS — I313 Pericardial effusion (noninflammatory): Secondary | ICD-10-CM

## 2019-10-05 DIAGNOSIS — I2694 Multiple subsegmental pulmonary emboli without acute cor pulmonale: Secondary | ICD-10-CM | POA: Diagnosis not present

## 2019-10-05 DIAGNOSIS — E538 Deficiency of other specified B group vitamins: Secondary | ICD-10-CM

## 2019-10-05 LAB — HEPARIN LEVEL (UNFRACTIONATED)
Heparin Unfractionated: 0.21 IU/mL — ABNORMAL LOW (ref 0.30–0.70)
Heparin Unfractionated: 0.47 IU/mL (ref 0.30–0.70)

## 2019-10-05 LAB — ECHOCARDIOGRAM COMPLETE
Height: 66 in
Weight: 3086.44 oz

## 2019-10-05 LAB — CBC
HCT: 31.4 % — ABNORMAL LOW (ref 36.0–46.0)
Hemoglobin: 10.1 g/dL — ABNORMAL LOW (ref 12.0–15.0)
MCH: 27.9 pg (ref 26.0–34.0)
MCHC: 32.2 g/dL (ref 30.0–36.0)
MCV: 86.7 fL (ref 80.0–100.0)
Platelets: 305 10*3/uL (ref 150–400)
RBC: 3.62 MIL/uL — ABNORMAL LOW (ref 3.87–5.11)
RDW: 13.5 % (ref 11.5–15.5)
WBC: 6.5 10*3/uL (ref 4.0–10.5)
nRBC: 0 % (ref 0.0–0.2)

## 2019-10-05 LAB — BASIC METABOLIC PANEL
Anion gap: 11 (ref 5–15)
BUN: 8 mg/dL (ref 8–23)
CO2: 25 mmol/L (ref 22–32)
Calcium: 8.8 mg/dL — ABNORMAL LOW (ref 8.9–10.3)
Chloride: 100 mmol/L (ref 98–111)
Creatinine, Ser: 0.57 mg/dL (ref 0.44–1.00)
GFR calc Af Amer: >60 mL/min (ref >60)
GFR calc non Af Amer: >60 mL/min (ref >60)
Glucose, Bld: 97 mg/dL (ref 70–99)
Potassium: 3.8 mmol/L (ref 3.5–5.1)
Sodium: 136 mmol/L (ref 135–145)

## 2019-10-05 LAB — PROTIME-INR
INR: 1.1 (ref 0.8–1.2)
Prothrombin Time: 13.9 seconds (ref 11.4–15.2)

## 2019-10-05 MED ORDER — HEPARIN BOLUS VIA INFUSION
1100.0000 [IU] | Freq: Once | INTRAVENOUS | Status: AC
  Administered 2019-10-05: 1100 [IU] via INTRAVENOUS
  Filled 2019-10-05: qty 1100

## 2019-10-05 MED ORDER — APIXABAN 5 MG PO TABS: 10.0000 mg | ORAL_TABLET | Freq: Two times a day (BID) | ORAL | 3 refills | Status: DC

## 2019-10-05 MED ORDER — APIXABAN 5 MG PO TABS: 5.0000 mg | ORAL_TABLET | Freq: Two times a day (BID) | ORAL | Status: DC

## 2019-10-05 MED ORDER — APIXABAN 5 MG PO TABS
10.0000 mg | ORAL_TABLET | Freq: Two times a day (BID) | ORAL | Status: DC
  Administered 2019-10-05: 10 mg via ORAL
  Filled 2019-10-05: qty 2

## 2019-10-05 MED ORDER — KETOROLAC TROMETHAMINE 15 MG/ML IJ SOLN
15.0000 mg | Freq: Once | INTRAMUSCULAR | Status: AC
  Administered 2019-10-05: 11:00:00 15 mg via INTRAVENOUS
  Filled 2019-10-05: qty 1

## 2019-10-05 NOTE — Progress Notes (Signed)
SATURATION QUALIFICATIONS: (This note is used to comply with regulatory documentation for home oxygen)  Patient Saturations on Room Air at Rest = 97%  Patient Saturations on Room Air while Ambulating = 97-99%

## 2019-10-05 NOTE — Discharge Summary (Addendum)
Physician Discharge Summary  Dana Bautista [id number] DOB: [birth year] DOA: 10/04/2019  PCP: Marinda Elk, MD  Admit date: 10/04/2019 Discharge date: 10/05/2019  Admitted From: Home Disposition: Home  Recommendations for Outpatient Follow-up:  1. Follow up with PCP in 1 week. 2. Patient is being discharged on Eliquis for acute bilateral PE and DVT.  Patient needs to be anticoagulated for at least 6 months given unprovoked DVT and PE.  Please obtain hypercoagulable work-up after she is off anticoagulation. 3. Please obtain follow-up 2D echo in 3 months to evaluate for the pericardial effusion.  Home Health: None Equipment/Devices: None  Discharge Condition: Fair CODE STATUS: Full code Diet recommendation: Regular    Discharge Diagnoses:  Principal problem   Multiple subsegmental pulmonary emboli without acute cor pulmonale (HCC)  Active Problems   Pericardial effusion   Pleural effusion   Lower leg DVT (deep venous thromboembolism), acute, right (HCC) Vitamin B 12 deficiency  Brief narrative/HPI 74 year old female with no significant past medical history presented with chest pain of several days which was sharp on the left side and extended to the right.  She went to her PCP few days back when she was tested for Covid and was negative.  Also reported some cramps in her legs few days prior to that.  She was prescribed doxycycline and Omnicef for possible bronchitis.  Given her persistent symptoms she had a CT angiogram of the chest which showed bilateral pulmonary embolism of the lower lobes and a small pericardial and small pleural effusion. In the ED patient was afebrile, normal blood pressure, heart rate and respiratory rate with stable O2 sat on room air.  EKG showed normal sinus rhythm and troponin was normal. Patient started on IV heparin bolus and placed on observation.  Hospital course Acute bilateral pulmonary embolism Acute right leg DVT Etiology not clear.  No prior  history of blood clot, sedentary lifestyle, trauma to the legs or recent surgical procedure.  No unintentional weight loss.  (Reports losing a few pounds intentionally recently).  No history of smoking.  Denies family history of blood clots or active malignancy.  (Has remote history of squamous cell carcinoma of the skin and basal cell carcinoma).  Respiratory viral panel checked again and patient was negative for COVID-19.  Patient hemodynamically stable and maintaining O2 sat both at rest and on ambulation.  Dyspnea and chest discomfort have markedly improved. A Doppler of the lower extremity done shows right tibial deep vein thrombosis with low clot burden.  A 2D echo was done to evaluate for right heart strain and pericardial effusion seen on CT chest.  Shows normal EF with no wall motion abnormality, no right heart strain  and a trivial pericardial effusion.  After discussing various anticoagulation options with their use, route of administration, outpatient monitoring and adverse effects patient agrees to be on NOAC.  I have started her on Eliquis and will be discharged on it. Given that the cause of her DVT and PE is unknown at this time she will need at least 6 months of anticoagulation.  Hypercoagulable work-up needs to be done once patient is off anticoagulation. Please also obtain a 2D echo in 3-6 months to evaluate the pericardial effusion.  Patient instructed to return to the ED if she has persistent or worsening chest pain, shortness of breath, dizziness, syncope/near syncope, hemoptysis, hematemesis, melena or hematuria.   Procedure: CT angiogram of the chest, 2D echo, Doppler lower extremity  Family communication: None Disposition: Home  Discharge Instructions   Allergies as of 10/05/2019      Reactions   Codeine Nausea And Vomiting   Levaquin [levofloxacin] Hives   Penicillins Hives, Other (See Comments)   Did it involve swelling of the face/tongue/throat, SOB, or low  BP? Unknown Did it involve sudden or severe rash/hives, skin peeling, or any reaction on the inside of your mouth or nose? Yes Did you need to seek medical attention at a hospital or doctor's office? Yes When did it last happen?many years If all above answers are "NO", may proceed with cephalosporin use.   Sulfur Other (See Comments)   Lip swelling, difficulty breathing   Latex Rash      Medication List    STOP taking these medications   cefdinir 300 MG capsule Commonly known as: OMNICEF   doxycycline 100 MG capsule Commonly known as: VIBRAMYCIN     TAKE these medications   apixaban 5 MG Tabs tablet Commonly known as: ELIQUIS Take 2 tablets (10 mg total) by mouth 2 (two) times daily. Take 2 tablets( 10 mg )  2 times a day for 7 days ( until 1/16) followed by 1 tablet (5 mg ) 2 times a day.   ascorbic acid 500 MG tablet Commonly known as: VITAMIN C Take 500 mg by mouth daily.   calcium carbonate 1500 (600 Ca) MG Tabs tablet Commonly known as: OSCAL Take 600 mg of elemental calcium by mouth 2 (two) times daily with a meal.   cyanocobalamin 1000 MCG/ML injection Commonly known as: (VITAMIN B-12) Inject 1,000 mcg into the muscle every 30 (thirty) days. What changed: Another medication with the same name was removed. Continue taking this medication, and follow the directions you see here.   HYDROcodone-acetaminophen 5-325 MG tablet Commonly known as: NORCO/VICODIN Take 1 tablet by mouth every 6 (six) hours as needed. What changed: reasons to take this   lubiprostone 24 MCG capsule Commonly known as: AMITIZA Take 24 mcg by mouth daily as needed for constipation.   MAGNESIUM-POTASSIUM PO Take 1 tablet by mouth daily.   ondansetron 4 MG disintegrating tablet Commonly known as: Zofran ODT Take 1 tablet (4 mg total) by mouth every 8 (eight) hours as needed for nausea or vomiting.   VITAMIN D (CHOLECALCIFEROL) PO Take by mouth.   Vitamin D (Ergocalciferol) 1.25 MG  (50000 UT) Caps capsule Commonly known as: DRISDOL Take 50,000 Units by mouth every 7 (seven) days.      Follow-up Information    Marinda Elk, MD. Schedule an appointment as soon as possible for a visit in 1 week(s).   Specialty: Physician Assistant Contact information: [address] [facility] [place] [address] [phone]          Allergies  Allergen Reactions  . Codeine Nausea And Vomiting  . Levaquin [Levofloxacin] Hives  . Penicillins Hives and Other (See Comments)    Did it involve swelling of the face/tongue/throat, SOB, or low BP? Unknown Did it involve sudden or severe rash/hives, skin peeling, or any reaction on the inside of your mouth or nose? Yes Did you need to seek medical attention at a hospital or doctor's office? Yes When did it last happen?many years If all above answers are "NO", may proceed with cephalosporin use.   . Sulfur Other (See Comments)    Lip swelling, difficulty breathing  . Latex Rash        Procedures/Studies: DG Chest 2 View  Result Date: 09/30/2019 CLINICAL DATA:  Chest pain.  EXAM: CHEST - 2 VIEW COMPARISON:  PA and lateral chest 11/02/2018 and 08/26/2015. FINDINGS: Linear left basilar scarring is unchanged. Lungs otherwise clear. No pneumothorax or pleural effusion. Heart size is normal. No acute or focal bony abnormality. Mild, remote midthoracic compression fracture is unchanged. IMPRESSION: No acute disease.  Stable compared to prior exam. Electronically Signed   By: Inge Rise M.D.   On: 09/30/2019 14:05   CT ANGIO CHEST PE W OR WO CONTRAST  Result Date: 10/04/2019 CLINICAL DATA:  Left-sided chest pain since Monday EXAM: CT ANGIOGRAPHY CHEST WITH CONTRAST TECHNIQUE: Multidetector CT imaging of the chest was performed using the standard protocol during bolus administration of intravenous contrast. Multiplanar CT image reconstructions and MIPs were obtained to evaluate the vascular anatomy.  CONTRAST:  30mL OMNIPAQUE IOHEXOL 300 MG/ML  SOLN COMPARISON:  None. FINDINGS: Cardiovascular: Satisfactory opacification of the pulmonary arteries to the segmental level. Small occlusive left lower lobe segmental pulmonary embolus. Small nonocclusive right lower lobe subsegmental pulmonary embolus. Normal heart size. Small pericardial effusion. Mediastinum/Nodes: No enlarged mediastinal, hilar, or axillary lymph nodes. Thyroid gland, trachea, and esophagus demonstrate no significant findings. Lungs/Pleura: Right lung is clear. No right pleural effusion or pneumothorax. Small left pleural effusion. Left basilar atelectasis. Upper Abdomen: No acute abnormality. Musculoskeletal: No chest wall abnormality. No acute or significant osseous findings. Chronic T8 vertebral body compression fracture. Soft tissue: Bilateral breast implants. Left intracapsular and extracapsular implant rupture. Review of the MIP images confirms the above findings. IMPRESSION: 1. Acute bilateral lower lobe pulmonary emboli as detailed above. No right heart strain. Small left pleural effusion. 2. Small pericardial effusion. Critical Value/emergent results were called by telephone at the time of interpretation on 10/04/2019 at 6:09 pm to providerDr. Ola Spurr, who verbally acknowledged these results. Electronically Signed   By: Kathreen Devoid   On: 10/04/2019 17:14   US Venous Img Lower Bilateral (DVT)  Result Date: 10/05/2019 CLINICAL DATA:  Small acute bilateral lower lobe pulmonary emboli. EXAM: BILATERAL LOWER EXTREMITY VENOUS DOPPLER ULTRASOUND TECHNIQUE: Gray-scale sonography with graded compression, as well as color Doppler and duplex ultrasound were performed to evaluate the lower extremity deep venous systems from the level of the common femoral vein and including the common femoral, femoral, profunda femoral, popliteal and calf veins including the posterior tibial, peroneal and gastrocnemius veins when visible. The superficial great  saphenous vein was also interrogated. Spectral Doppler was utilized to evaluate flow at rest and with distal augmentation maneuvers in the common femoral, femoral and popliteal veins. COMPARISON:  10/04/2019 FINDINGS: RIGHT LOWER EXTREMITY Common Femoral Vein: No evidence of thrombus. Normal compressibility, respiratory phasicity and response to augmentation. Saphenofemoral Junction: No evidence of thrombus. Normal compressibility and flow on color Doppler imaging. Profunda Femoral Vein: No evidence of thrombus. Normal compressibility and flow on color Doppler imaging. Femoral Vein: No evidence of thrombus. Normal compressibility, respiratory phasicity and response to augmentation. Popliteal Vein: No evidence of thrombus. Normal compressibility, respiratory phasicity and response to augmentation. Calf Veins: Right calf posterior tibial branch demonstrates hypoechoic intraluminal thrombus appearing occlusive. Vessel is noncompressible. Other calf veins remain patent. Very low thrombus burden. LEFT LOWER EXTREMITY Common Femoral Vein: No evidence of thrombus. Normal compressibility, respiratory phasicity and response to augmentation. Saphenofemoral Junction: No evidence of thrombus. Normal compressibility and flow on color Doppler imaging. Profunda Femoral Vein: No evidence of thrombus. Normal compressibility and flow on color Doppler imaging. Femoral Vein: No evidence of thrombus. Normal compressibility, respiratory phasicity and response to augmentation. Popliteal Vein: No evidence of  thrombus. Normal compressibility, respiratory phasicity and response to augmentation. Calf Veins: No evidence of thrombus. Normal compressibility and flow on color Doppler imaging. IMPRESSION: Right calf posterior tibial occlusive DVT. Very low thrombus burden. Otherwise no significant femoropopliteal DVT on either side. Electronically Signed   By: Jerilynn Mages.  Shick M.D.   On: 10/05/2019 12:45   ECHOCARDIOGRAM COMPLETE  Result Date:  10/05/2019   ECHOCARDIOGRAM REPORT   Patient Name:   MILANIA Talamo   Date of Exam: 10/05/2019 Medical Rec #:  [id number]  Height:       66.0 in Accession #:    [id number] Weight:       192.9 lb Date of Birth:  1945-11-07 BSA:          1.97 m Patient Age:    77 years   BP:           114/58 mmHg Patient Gender: F          HR:           66 bpm. Exam Location:  [facility] Procedure: 2D Echo Indications:     Pericardial Effusion 423.9/ I31.3  History:         Patient has no prior history of Echocardiogram examinations.  Sonographer:     Arville Go RDCS Referring Phys:  [id number] Athena Masse Diagnosing Phys: Bartholome Bill MD IMPRESSIONS  1. Left ventricular ejection fraction, by visual estimation, is 60 to 65%. The left ventricle has normal function. Left ventricular septal wall thickness was normal. Normal left ventricular posterior wall thickness. There is no left ventricular hypertrophy.  2. The left ventricle has no regional wall motion abnormalities.  3. Global right ventricle has normal systolic function.The right ventricular size is mildly enlarged. No increase in right ventricular wall thickness.  4. Left atrial size was normal.  5. Right atrial size was mildly dilated.  6. Trivial pericardial effusion is present.  7. The mitral valve is grossly normal. Trivial mitral valve regurgitation.  8. The tricuspid valve is not well visualized.  9. The aortic valve was not well visualized. Aortic valve regurgitation is trivial. 10. The pulmonic valve was not well visualized. Pulmonic valve regurgitation is trivial. 11. Mildly elevated pulmonary artery systolic pressure. 12. The atrial septum is grossly normal. FINDINGS  Left Ventricle: Left ventricular ejection fraction, by visual estimation, is 60 to 65%. The left ventricle has normal function. The left ventricle has no regional wall motion abnormalities. Normal left ventricular posterior wall thickness. There is no left ventricular hypertrophy. Left ventricular diastolic  parameters were normal. Right Ventricle: The right ventricular size is mildly enlarged. No increase in right ventricular wall thickness. Global RV systolic function is has normal systolic function. The tricuspid regurgitant velocity is 35.00 m/s, and with an assumed right atrial pressure of 10 mmHg, the estimated right ventricular systolic pressure is mildly elevated at 4910.0 mmHg. Left Atrium: Left atrial size was normal in size. Right Atrium: Right atrial size was mildly dilated Pericardium: Trivial pericardial effusion is present. There is no evidence of cardiac tamponade. Mitral Valve: The mitral valve is grossly normal. Trivial mitral valve regurgitation. Tricuspid Valve: The tricuspid valve is not well visualized. Tricuspid valve regurgitation is mild. Aortic Valve: The aortic valve was not well visualized. Aortic valve regurgitation is trivial. Aortic valve peak gradient measures 13.1 mmHg. Pulmonic Valve: The pulmonic valve was not well visualized. Pulmonic valve regurgitation is trivial. Pulmonic regurgitation is trivial. Aorta: The aortic root is normal in size and structure. IAS/Shunts: The atrial septum  is grossly normal.  LEFT VENTRICLE PLAX 2D LVIDd:         4.93 cm  Diastology LVIDs:         3.36 cm  LV e' lateral:   8.27 cm/s LV PW:         0.92 cm  LV E/e' lateral: 12.5 LV IVS:        0.92 cm  LV e' medial:    8.81 cm/s LVOT diam:     1.80 cm  LV E/e' medial:  11.7 LV SV:         68 ml LV SV Index:   33.38 LVOT Area:     2.54 cm  RIGHT VENTRICLE RV Basal diam:  3.19 cm RV S prime:     17.60 cm/s TAPSE (M-mode): 2.8 cm LEFT ATRIUM           Index       RIGHT ATRIUM           Index LA diam:      3.50 cm 1.78 cm/m  RA Area:     16.10 cm LA Vol (A2C): 21.3 ml 10.82 ml/m RA Volume:   45.10 ml  22.90 ml/m LA Vol (A4C): 24.6 ml 12.49 ml/m  AORTIC VALVE                PULMONIC VALVE AV Area (Vmax): 1.76 cm    PV Vmax:       1.14 m/s AV Vmax:        181.00 cm/s PV Peak grad:  5.2 mmHg AV Peak Grad:    13.1 mmHg LVOT Vmax:      125.00 cm/s LVOT Vmean:     81.800 cm/s LVOT VTI:       0.298 m  AORTA Ao Root diam: 2.90 cm Ao Asc diam:  2.70 cm MITRAL VALVE                         TRICUSPID VALVE MV Area (PHT): 3.08 cm              TV Peak grad:   29.4 mmHg MV PHT:        71.34 msec            TV Vmax:        2.71 m/s MV Decel Time: 246 msec              TR Peak grad:   4900.0 mmHg MV E velocity: 103.00 cm/s 103 cm/s  TR Vmax:        3500.00 cm/s MV A velocity: 97.30 cm/s  70.3 cm/s MV E/A ratio:  1.06        1.5       SHUNTS                                      Systemic VTI:  0.30 m                                      Systemic Diam: 1.80 cm  Bartholome Bill MD Electronically signed by Bartholome Bill MD Signature Date/Time: 10/05/2019/11:00:41 AM    Final     Subjective: Reports mild headache.  Chest discomfort markedly improved.  Discharge Exam: Vitals:   10/04/19 2308 10/05/19 0414  BP: (!) 129/54 (!) 114/58  Pulse: 89 70  Resp: 20 20  Temp: 99 F (37.2 C) 97.8 F (36.6 C)  SpO2: 100% 96%   Vitals:   10/04/19 1824 10/04/19 2130 10/04/19 2308 10/05/19 0414  BP:  97/62 (!) 129/54 (!) 114/58  Pulse:  83 89 70  Resp:  16 20 20   Temp:   99 F (37.2 C) 97.8 F (36.6 C)  TempSrc:   Oral Oral  SpO2:  100% 100% 96%  Weight: 81 kg  87.5 kg   Height: 5\' 6"  (1.676 m)  5\' 6"  (1.676 m)     General: Elderly female not in distress HEENT: Moist mucosa, supple neck Chest: Clear to auscultation bilaterally CVs: Normal S1-S2, no murmurs rub or gallop GI: Soft, nondistended, nontender Musculoskeletal: Warm, no edema, no leg swelling or calf tenderness      The results of significant diagnostics from this hospitalization (including imaging, microbiology, ancillary and laboratory) are listed below for reference.     Microbiology: Recent Results (from the past 240 hour(s))  Novel Coronavirus, NAA (Hosp order, Send-out to Ref Lab; TAT 18-24 hrs     Status: None   Collection Time: 09/30/19 12:47 PM    Specimen: Nasopharyngeal Swab; Respiratory  Result Value Ref Range Status   SARS-CoV-2, NAA NOT DETECTED NOT DETECTED Final    Comment: (NOTE) This nucleic acid amplification test was developed and its performance characteristics determined by [employer]. Nucleic acid amplification tests include PCR and TMA. This test has not been FDA cleared or approved. This test has been authorized by FDA under an Emergency Use Authorization (EUA). This test is only authorized for the duration of time the declaration that circumstances exist justifying the authorization of the emergency use of in vitro diagnostic tests for detection of SARS-CoV-2 virus and/or diagnosis of COVID-19 infection under section 564(b)(1) of the Act, 21 U.S.C. [id number]) (1), unless the authorization is terminated or revoked sooner. When diagnostic testing is negative, the possibility of a false negative result should be considered in the context of a patient's recent exposures and the presence of clinical signs and symptoms consistent with COVID-19. An individual without symptoms of COVID- 19 and who is not shedding SARS-CoV-2 vi rus would expect to have a negative (not detected) result in this assay. Performed At: [facility] [address] [place], [place] [id number] Katina Degree MDPhD [id number]    Coronavirus Source NASOPHARYNGEAL  Final    Comment: Performed at [facility], [address], [place], [place] [address]  Urine culture     Status: Abnormal   Collection Time: 09/30/19  1:09 PM   Specimen: Urine, Clean Catch  Result Value Ref Range Status   Specimen Description   Final    URINE, CLEAN CATCH Performed at [facility], [address] [place], [place] [address]    Special Requests   Final    NONE Performed at [facility], [address], [place], [place] [address]    Culture (A)  Final    <10,000 COLONIES/mL INSIGNIFICANT GROWTH Performed at  [facility] [address] [place],  [address]    Report Status 10/01/2019 FINAL  Final  Respiratory Panel by RT PCR (Flu A&B, Covid) - Nasopharyngeal Swab     Status: None   Collection Time: 10/04/19  7:42 PM   Specimen: Nasopharyngeal Swab  Result Value Ref Range Status   SARS Coronavirus 2 by RT PCR NEGATIVE NEGATIVE  Final    Comment: (NOTE) SARS-CoV-2 target nucleic acids are NOT DETECTED. The SARS-CoV-2 RNA is generally detectable in upper respiratoy specimens during the acute phase of infection. The lowest concentration of SARS-CoV-2 viral copies this assay can detect is 131 copies/mL. A negative result does not preclude SARS-Cov-2 infection and should not be used as the sole basis for treatment or other patient management decisions. A negative result may occur with  improper specimen collection/handling, submission of specimen other than nasopharyngeal swab, presence of viral mutation(s) within the areas targeted by this assay, and inadequate number of viral copies (<131 copies/mL). A negative result must be combined with clinical observations, patient history, and epidemiological information. The expected result is Negative. Fact Sheet for Patients:  PinkCheek.be Fact Sheet for Healthcare Providers:  GravelBags.it This test is not yet ap proved or cleared by the [place] FDA and  has been authorized for detection and/or diagnosis of SARS-CoV-2 by FDA under an Emergency Use Authorization (EUA). This EUA will remain  in effect (meaning this test can be used) for the duration of the COVID-19 declaration under Section 564(b)(1) of the Act, 21 U.S.C. section 360bbb-3(b)(1), unless the authorization is terminated or revoked sooner.    Influenza A by PCR NEGATIVE NEGATIVE Final   Influenza B by PCR NEGATIVE NEGATIVE Final    Comment: (NOTE) The Xpert Xpress SARS-CoV-2/FLU/RSV assay is intended as an  aid in  the diagnosis of influenza from Nasopharyngeal swab specimens and  should not be used as a sole basis for treatment. Nasal washings and  aspirates are unacceptable for Xpert Xpress SARS-CoV-2/FLU/RSV  testing. Fact Sheet for Patients: PinkCheek.be Fact Sheet for Healthcare Providers: GravelBags.it This test is not yet approved or cleared by the [place] FDA and  has been authorized for detection and/or diagnosis of SARS-CoV-2 by  FDA under an Emergency Use Authorization (EUA). This EUA will remain  in effect (meaning this test can be used) for the duration of the  Covid-19 declaration under Section 564(b)(1) of the Act, 21  U.S.C. section 360bbb-3(b)(1), unless the authorization is  terminated or revoked. Performed at [facility], [address] [place], [place] [address]      Labs: BNP (last 3 results) No results for input(s): BNP in the last 8760 hours. Basic Metabolic Panel: Recent Labs  Lab 10/04/19 1649 10/04/19 1832 10/05/19 0516  NA  --  133* 136  K  --  3.6 3.8  CL  --  96* 100  CO2  --  28 25  GLUCOSE  --  123* 97  BUN  --  10 8  CREATININE 0.50 0.62 0.57  CALCIUM  --  9.2 8.8*   Liver Function Tests: Recent Labs  Lab 10/04/19 1832  AST 10*  ALT 8  ALKPHOS 64  BILITOT 1.2  PROT 7.6  ALBUMIN 3.7   No results for input(s): LIPASE, AMYLASE in the last 168 hours. No results for input(s): AMMONIA in the last 168 hours. CBC: Recent Labs  Lab 09/30/19 1347 10/04/19 1832 10/05/19 0516  WBC 9.7 9.1 6.5  NEUTROABS  --  6.1  --   HGB 10.9* 11.1* 10.1*  HCT 32.9* 34.7* 31.4*  MCV 85.0 87.2 86.7  PLT 231 342 305   Cardiac Enzymes: No results for input(s): CKTOTAL, CKMB, CKMBINDEX, TROPONINI in the last 168 hours. BNP: Invalid input(s): POCBNP CBG: No results for input(s): GLUCAP in the last 168 hours. D-Dimer No results for input(s): DDIMER in the last 72 hours. Hgb  A1c No results for input(s): HGBA1C in the last 72 hours. Lipid Profile No results for input(s): CHOL, HDL, LDLCALC, TRIG, CHOLHDL, LDLDIRECT in the last 72 hours. Thyroid function studies No results for input(s): TSH, T4TOTAL, T3FREE, THYROIDAB in the last 72 hours.  Invalid input(s): FREET3 Anemia work up Recent Labs    10/04/19 1832  FERRITIN 176   Urinalysis    Component Value Date/Time   COLORURINE YELLOW 09/30/2019 1309   APPEARANCEUR CLEAR 09/30/2019 1309   LABSPEC 1.020 09/30/2019 1309   PHURINE 7.0 09/30/2019 1309   GLUCOSEU NEGATIVE 09/30/2019 1309   HGBUR NEGATIVE 09/30/2019 1309   BILIRUBINUR NEGATIVE 09/30/2019 1309   KETONESUR NEGATIVE 09/30/2019 1309   PROTEINUR NEGATIVE 09/30/2019 1309   NITRITE NEGATIVE 09/30/2019 1309   LEUKOCYTESUR TRACE (A) 09/30/2019 1309   Sepsis Labs Invalid input(s): PROCALCITONIN,  WBC,  LACTICIDVEN Microbiology Recent Results (from the past 240 hour(s))  Novel Coronavirus, NAA (Hosp order, Send-out to Ref Lab; TAT 18-24 hrs     Status: None   Collection Time: 09/30/19 12:47 PM   Specimen: Nasopharyngeal Swab; Respiratory  Result Value Ref Range Status   SARS-CoV-2, NAA NOT DETECTED NOT DETECTED Final    Comment: (NOTE) This nucleic acid amplification test was developed and its performance characteristics determined by [employer]. Nucleic acid amplification tests include PCR and TMA. This test has not been FDA cleared or approved. This test has been authorized by FDA under an Emergency Use Authorization (EUA). This test is only authorized for the duration of time the declaration that circumstances exist justifying the authorization of the emergency use of in vitro diagnostic tests for detection of SARS-CoV-2 virus and/or diagnosis of COVID-19 infection under section 564(b)(1) of the Act, 21 U.S.C. [id number]) (1), unless the authorization is terminated or revoked sooner. When diagnostic testing is negative, the  possibility of a false negative result should be considered in the context of a patient's recent exposures and the presence of clinical signs and symptoms consistent with COVID-19. An individual without symptoms of COVID- 19 and who is not shedding SARS-CoV-2 vi rus would expect to have a negative (not detected) result in this assay. Performed At: [facility] [address] [place], [place] [id number] Katina Degree MDPhD [id number]    Coronavirus Source NASOPHARYNGEAL  Final    Comment: Performed at [facility], [address] [place], [place] [address]  Urine culture     Status: Abnormal   Collection Time: 09/30/19  1:09 PM   Specimen: Urine, Clean Catch  Result Value Ref Range Status   Specimen Description   Final    URINE, CLEAN CATCH Performed at [facility], [address] [place], [place] [address]    Special Requests   Final    NONE Performed at [facility], [address] [place], [place] [address]    Culture (A)  Final    <10,000 COLONIES/mL INSIGNIFICANT GROWTH Performed at [facility] [address], [place], [place] [address]    Report Status 10/01/2019 FINAL  Final  Respiratory Panel by RT PCR (Flu A&B, Covid) - Nasopharyngeal Swab     Status: None   Collection Time: 10/04/19  7:42 PM   Specimen: Nasopharyngeal Swab  Result Value Ref Range Status   SARS Coronavirus 2 by RT PCR NEGATIVE NEGATIVE Final    Comment: (NOTE) SARS-CoV-2 target nucleic acids are NOT DETECTED. The SARS-CoV-2 RNA is generally detectable in upper respiratoy specimens during the acute  phase of infection. The lowest concentration of SARS-CoV-2 viral copies this assay can detect is 131 copies/mL. A negative result does not preclude SARS-Cov-2 infection and should not be used as the sole basis for treatment or other patient management decisions. A negative result may occur with  improper specimen collection/handling, submission of  specimen other than nasopharyngeal swab, presence of viral mutation(s) within the areas targeted by this assay, and inadequate number of viral copies (<131 copies/mL). A negative result must be combined with clinical observations, patient history, and epidemiological information. The expected result is Negative. Fact Sheet for Patients:  PinkCheek.be Fact Sheet for Healthcare Providers:  GravelBags.it This test is not yet ap proved or cleared by the [place] FDA and  has been authorized for detection and/or diagnosis of SARS-CoV-2 by FDA under an Emergency Use Authorization (EUA). This EUA will remain  in effect (meaning this test can be used) for the duration of the COVID-19 declaration under Section 564(b)(1) of the Act, 21 U.S.C. section 360bbb-3(b)(1), unless the authorization is terminated or revoked sooner.    Influenza A by PCR NEGATIVE NEGATIVE Final   Influenza B by PCR NEGATIVE NEGATIVE Final    Comment: (NOTE) The Xpert Xpress SARS-CoV-2/FLU/RSV assay is intended as an aid in  the diagnosis of influenza from Nasopharyngeal swab specimens and  should not be used as a sole basis for treatment. Nasal washings and  aspirates are unacceptable for Xpert Xpress SARS-CoV-2/FLU/RSV  testing. Fact Sheet for Patients: PinkCheek.be Fact Sheet for Healthcare Providers: GravelBags.it This test is not yet approved or cleared by the [place] FDA and  has been authorized for detection and/or diagnosis of SARS-CoV-2 by  FDA under an Emergency Use Authorization (EUA). This EUA will remain  in effect (meaning this test can be used) for the duration of the  Covid-19 declaration under Section 564(b)(1) of the Act, 21  U.S.C. section 360bbb-3(b)(1), unless the authorization is  terminated or revoked. Performed at [facility], [address],  [place], [place] [address]      Time coordinating discharge: 30 minutes  SIGNED:   Louellen Molder, MD  [facility] 10/05/2019, 4:44 PM Pager   If 7PM-7AM, please contact night-coverage [email or web address] Password [email or web address]

## 2019-10-05 NOTE — Discharge Instructions (Signed)
Pulmonary Embolism  A pulmonary embolism (PE) is a sudden blockage or decrease of blood flow in one or both lungs. Most blockages come from a blood clot that forms in the vein of a lower leg, thigh, or arm (deep vein thrombosis, DVT) and travels to the lungs. A clot is blood that has thickened into a gel or solid. PE is a dangerous and life-threatening condition that needs to be treated right away. What are the causes? This condition is usually caused by a blood clot that forms in a vein and moves to the lungs. In rare cases, it may be caused by air, fat, part of a tumor, or other tissue that moves through the veins and into the lungs. What increases the risk? The following factors may make you more likely to develop this condition:  Experiencing a traumatic injury, such as breaking a hip or leg.  Having: ? A spinal cord injury. ? Orthopedic surgery, especially hip or knee replacement. ? Any major surgery. ? A stroke. ? DVT. ? Blood clots or blood clotting disease. ? Long-term (chronic) lung or heart disease. ? Cancer treated with chemotherapy. ? A central venous catheter.  Taking medicines that contain estrogen. These include birth control pills and hormone replacement therapy.  Being: ? Pregnant. ? In the period of time after your baby is delivered (postpartum). ? Older than age 60. ? Overweight. ? A smoker, especially if you have other risks. What are the signs or symptoms? Symptoms of this condition usually start suddenly and include:  Shortness of breath during activity or at rest.  Coughing, coughing up blood, or coughing up blood-tinged mucus.  Chest pain that is often worse with deep breaths.  Rapid or irregular heartbeat.  Feeling light-headed or dizzy.  Fainting.  Feeling anxious.  Fever.  Sweating.  Pain and swelling in a leg. This is a symptom of DVT, which can lead to PE. How is this diagnosed? This condition may be diagnosed based on:  Your medical  history.  A physical exam.  Blood tests.  CT pulmonary angiogram. This test checks blood flow in and around your lungs.  Ventilation-perfusion scan, also called a lung VQ scan. This test measures air flow and blood flow to the lungs.  An ultrasound of the legs. How is this treated? Treatment for this condition depends on many factors, such as the cause of your PE, your risk for bleeding or developing more clots, and other medical conditions you have. Treatment aims to remove, dissolve, or stop blood clots from forming or growing larger. Treatment may include:  Medicines, such as: ? Blood thinning medicines (anticoagulants) to stop clots from forming. ? Medicines that dissolve clots (thrombolytics).  Procedures, such as: ? Using a flexible tube to remove a blood clot (embolectomy) or to deliver medicine to destroy it (catheter-directed thrombolysis). ? Inserting a filter into a large vein that carries blood to the heart (inferior vena cava). This filter (vena cava filter) catches blood clots before they reach the lungs. ? Surgery to remove the clot (surgical embolectomy). This is rare. You may need a combination of immediate, long-term (up to 3 months after diagnosis), and extended (more than 3 months after diagnosis) treatments. Your treatment may continue for several months (maintenance therapy). You and your health care provider will work together to choose the treatment program that is best for you. Follow these instructions at home: Medicines  Take over-the-counter and prescription medicines only as told by your health care provider.  If you   are taking an anticoagulant medicine: ? Take the medicine every day at the same time each day. ? Understand what foods and drugs interact with your medicine. ? Understand the side effects of this medicine, including excessive bruising or bleeding. Ask your health care provider or pharmacist about other side effects. General  instructions  Wear a medical alert bracelet or carry a medical alert card that says you have had a PE and lists what medicines you take.  Ask your health care provider when you may return to your normal activities. Avoid sitting or lying for a long time without moving.  Maintain a healthy weight. Ask your health care provider what weight is healthy for you.  Do not use any products that contain nicotine or tobacco, such as cigarettes, e-cigarettes, and chewing tobacco. If you need help quitting, ask your health care provider.  Talk with your health care provider about any travel plans. It is important to make sure that you are still able to take your medicine while on trips.  Keep all follow-up visits as told by your health care provider. This is important. Contact a health care provider if:  You missed a dose of your blood thinner medicine. Get help right away if:  You have: ? New or increased pain, swelling, warmth, or redness in an arm or leg. ? Numbness or tingling in an arm or leg. ? Shortness of breath during activity or at rest. ? A fever. ? Chest pain. ? A rapid or irregular heartbeat. ? A severe headache. ? Vision changes. ? A serious fall or accident, or you hit your head. ? Stomach (abdominal) pain. ? Blood in your vomit, stool, or urine. ? A cut that will not stop bleeding.  You cough up blood.  You feel light-headed or dizzy.  You cannot move your arms or legs.  You are confused or have memory loss. These symptoms may represent a serious problem that is an emergency. Do not wait to see if the symptoms will go away. Get medical help right away. Call your local emergency services (911 in the [place] Do not drive yourself to the hospital. Summary  A pulmonary embolism (PE) is a sudden blockage or decrease of blood flow in one or both lungs. PE is a dangerous and life-threatening condition that needs to be treated right away.  Treatments for this condition usually  include medicines to thin your blood (anticoagulants) or medicines to break apart blood clots (thrombolytics).  If you are given blood thinners, it is important to take the medicine every day at the same time each day.  Understand what foods and drugs interact with any medicines that you are taking.  If you have signs of PE or DVT, call your local emergency services (911 in the [place] This information is not intended to replace advice given to you by your health care provider. Make sure you discuss any questions you have with your health care provider. Document Revised: 06/20/2018 Document Reviewed: 06/20/2018 Elsevier Patient Education  2020 [place].   Deep Vein Thrombosis  Deep vein thrombosis (DVT) is a condition in which a blood clot forms in a deep vein, such as a lower leg, thigh, or arm vein. A clot is blood that has thickened into a gel or solid. This condition is dangerous. It can lead to serious and even life-threatening complications if the clot travels to the lungs and causes a blockage (pulmonary embolism). It can also damage veins in the leg. This can result  in leg pain, swelling, discoloration, and sores (post-thrombotic syndrome). What are the causes? This condition may be caused by:  A slowdown of blood flow.  Damage to a vein.  A condition that causes blood to clot more easily, such as an inherited clotting disorder. What increases the risk? The following factors may make you more likely to develop this condition:  Being overweight.  Being older, especially over age 84.  Sitting or lying down for more than four hours.  Being in the hospital.  Lack of physical activity (sedentary lifestyle).  Pregnancy, being in childbirth, or having recently given birth.  Taking medicines that contain estrogen, such as medicines to prevent pregnancy.  Smoking.  A history of any of the following: ? Blood clots or a blood clotting disease. ? Peripheral vascular  disease. ? Inflammatory bowel disease. ? Cancer. ? Heart disease. ? Genetic conditions that affect how your blood clots, such as Factor V Leiden mutation. ? Neurological diseases that affect your legs (leg paresis). ? A recent injury, such as a car accident. ? Major or lengthy surgery. ? A central line placed inside a large vein. What are the signs or symptoms? Symptoms of this condition include:  Swelling, pain, or tenderness in an arm or leg.  Warmth, redness, or discoloration in an arm or leg. If the clot is in your leg, symptoms may be more noticeable or worse when you stand or walk. Some people may not develop any symptoms. How is this diagnosed? This condition is diagnosed with:  A medical history and physical exam.  Tests, such as: ? Blood tests. These are done to check how well your blood clots. ? Ultrasound. This is done to check for clots. ? Venogram. For this test, contrast dye is injected into a vein and X-rays are taken to check for any clots. How is this treated? Treatment for this condition depends on:  The cause of your DVT.  Your risk for bleeding or developing more clots.  Any other medical conditions that you have. Treatment may include:  Taking a blood thinner (anticoagulant). This type of medicine prevents clots from forming. It may be taken by mouth, injected under the skin, or injected through an IV (catheter).  Injecting clot-dissolving medicines into the affected vein (catheter-directed thrombolysis).  Having surgery. Surgery may be done to: ? Remove the clot. ? Place a filter in a large vein to catch blood clots before they reach the lungs. Some treatments may be continued for up to six months. Follow these instructions at home: If you are taking blood thinners:  Take the medicine exactly as told by your health care provider. Some blood thinners need to be taken at the same time every day. Do not skip a dose.  Talk with your health care  provider before you take any medicines that contain aspirin or NSAIDs. These medicines increase your risk for dangerous bleeding.  Ask your health care provider about foods and drugs that could change the way the medicine works (may interact). Avoid those things if your health care provider tells you to do so.  Blood thinners can cause easy bruising and may make it difficult to stop bleeding. Because of this: ? Be very careful when using knives, scissors, or other sharp objects. ? Use an electric razor instead of a blade. ? Avoid activities that could cause injury or bruising, and follow instructions about how to prevent falls.  Wear a medical alert bracelet or carry a card that lists what medicines  you take. General instructions  Take over-the-counter and prescription medicines only as told by your health care provider.  Return to your normal activities as told by your health care provider. Ask your health care provider what activities are safe for you.  Wear compression stockings if recommended by your health care provider.  Keep all follow-up visits as told by your health care provider. This is important. How is this prevented? To lower your risk of developing this condition again:  For 30 or more minutes every day, do an activity that: ? Involves moving your arms and legs. ? Increases your heart rate.  When traveling for longer than four hours: ? Exercise your arms and legs every hour. ? Drink plenty of water. ? Avoid drinking alcohol.  Avoid sitting or lying for a long time without moving your legs.  If you have surgery or you are hospitalized, ask about ways to prevent blood clots. These may include taking frequent walks or using anticoagulants.  Stay at a healthy weight.  If you are a woman who is older than age 77, avoid unnecessary use of medicines that contain estrogen, such as some birth control pills.  Do not use any products that contain nicotine or tobacco, such  as cigarettes and e-cigarettes. This is especially important if you take estrogen medicines. If you need help quitting, ask your health care provider. Contact a health care provider if:  You miss a dose of your blood thinner.  Your menstrual period is heavier than usual.  You have unusual bruising. Get help right away if:  You have: ? New or increased pain, swelling, or redness in an arm or leg. ? Numbness or tingling in an arm or leg. ? Shortness of breath. ? Chest pain. ? A rapid or irregular heartbeat. ? A severe headache or confusion. ? A cut that will not stop bleeding.  There is blood in your vomit, stool, or urine.  You have a serious fall or accident, or you hit your head.  You feel light-headed or dizzy.  You cough up blood. These symptoms may represent a serious problem that is an emergency. Do not wait to see if the symptoms will go away. Get medical help right away. Call your local emergency services (911 in the [place] Do not drive yourself to the hospital. Summary  Deep vein thrombosis (DVT) is a condition in which a blood clot forms in a deep vein, such as a lower leg, thigh, or arm vein.  Symptoms can include swelling, warmth, pain, and redness in your leg or arm.  This condition may be treated with a blood thinner (anticoagulant medicine), medicine that is injected to dissolve blood clots,compression stockings, or surgery.  If you are prescribed blood thinners, take them exactly as told. This information is not intended to replace advice given to you by your health care provider. Make sure you discuss any questions you have with your health care provider. Document Revised: 08/25/2017 Document Reviewed: 02/10/2017 Elsevier Patient Education  [place].   Apixaban oral tablets What is this medicine? APIXABAN (a PIX a ban) is an anticoagulant (blood thinner). It is used to lower the chance of stroke in people with a medical condition called atrial  fibrillation. It is also used to treat or prevent blood clots in the lungs or in the veins. This medicine may be used for other purposes; ask your health care provider or pharmacist if you have questions. COMMON BRAND NAME(S): Eliquis What should I tell my  health care provider before I take this medicine? They need to know if you have any of these conditions:  antiphospholipid antibody syndrome  bleeding disorders  bleeding in the brain  blood in your stools (black or tarry stools) or if you have blood in your vomit  history of blood clots  history of stomach bleeding  kidney disease  liver disease  mechanical heart valve  an unusual or allergic reaction to apixaban, other medicines, foods, dyes, or preservatives  pregnant or trying to get pregnant  breast-feeding How should I use this medicine? Take this medicine by mouth with a glass of water. Follow the directions on the prescription label. You can take it with or without food. If it upsets your stomach, take it with food. Take your medicine at regular intervals. Do not take it more often than directed. Do not stop taking except on your doctor's advice. Stopping this medicine may increase your risk of a blood clot. Be sure to refill your prescription before you run out of medicine. Talk to your pediatrician regarding the use of this medicine in children. Special care may be needed. Overdosage: If you think you have taken too much of this medicine contact a poison control center or emergency room at once. NOTE: This medicine is only for you. Do not share this medicine with others. What if I miss a dose? If you miss a dose, take it as soon as you can. If it is almost time for your next dose, take only that dose. Do not take double or extra doses. What may interact with this medicine? This medicine may interact with the following:  aspirin and aspirin-like medicines  certain medicines for fungal infections like ketoconazole  and itraconazole  certain medicines for seizures like carbamazepine and phenytoin  certain medicines that treat or prevent blood clots like warfarin, enoxaparin, and dalteparin  clarithromycin  NSAIDs, medicines for pain and inflammation, like ibuprofen or naproxen  rifampin  ritonavir  St. John's wort This list may not describe all possible interactions. Give your health care provider a list of all the medicines, herbs, non-prescription drugs, or dietary supplements you use. Also tell them if you smoke, drink alcohol, or use illegal drugs. Some items may interact with your medicine. What should I watch for while using this medicine? Visit your healthcare professional for regular checks on your progress. You may need blood work done while you are taking this medicine. Your condition will be monitored carefully while you are receiving this medicine. It is important not to miss any appointments. Avoid sports and activities that might cause injury while you are using this medicine. Severe falls or injuries can cause unseen bleeding. Be careful when using sharp tools or knives. Consider using an Copy. Take special care brushing or flossing your teeth. Report any injuries, bruising, or red spots on the skin to your healthcare professional. If you are going to need surgery or other procedure, tell your healthcare professional that you are taking this medicine. Wear a medical ID bracelet or chain. Carry a card that describes your disease and details of your medicine and dosage times. What side effects may I notice from receiving this medicine? Side effects that you should report to your doctor or health care professional as soon as possible:  allergic reactions like skin rash, itching or hives, swelling of the face, lips, or tongue  signs and symptoms of bleeding such as bloody or black, tarry stools; red or dark-brown urine; spitting up blood  or brown material that looks like coffee  grounds; red spots on the skin; unusual bruising or bleeding from the eye, gums, or nose  signs and symptoms of a blood clot such as chest pain; shortness of breath; pain, swelling, or warmth in the leg  signs and symptoms of a stroke such as changes in vision; confusion; trouble speaking or understanding; severe headaches; sudden numbness or weakness of the face, arm or leg; trouble walking; dizziness; loss of coordination This list may not describe all possible side effects. Call your doctor for medical advice about side effects. You may report side effects to FDA at 1-800-FDA-1088. Where should I keep my medicine? Keep out of the reach of children. Store at room temperature between 20 and 25 degrees C (68 and 77 degrees F). Throw away any unused medicine after the expiration date. NOTE: This sheet is a summary. It may not cover all possible information. If you have questions about this medicine, talk to your doctor, pharmacist, or health care provider.  2020 Elsevier/Gold Standard (2018-05-23 17:39:34)

## 2019-10-05 NOTE — Progress Notes (Signed)
Dc instructions given. Education on how and when to take eliquis,  Also explained side effects and signs of emergency to pt and her spouse.  Discussed activity, follow up care. Verbalized clear understanding.  Used teach back.  Dc home via wc

## 2019-10-05 NOTE — Progress Notes (Signed)
ANTICOAGULATION CONSULT NOTE - Initial Consult  Pharmacy Consult for Eliquis Indication: pulmonary embolus/DVT treatment  Allergies  Allergen Reactions  . Codeine Nausea And Vomiting  . Levaquin [Levofloxacin] Hives  . Penicillins Hives and Other (See Comments)    Did it involve swelling of the face/tongue/throat, SOB, or low BP? Unknown Did it involve sudden or severe rash/hives, skin peeling, or any reaction on the inside of your mouth or nose? Yes Did you need to seek medical attention at a hospital or doctor's office? Yes When did it last happen?many years If all above answers are "NO", may proceed with cephalosporin use.   . Sulfur Other (See Comments)    Lip swelling, difficulty breathing  . Latex Rash    Patient Measurements: Height: 5\' 6"  (167.6 cm) Weight: 192 lb 14.4 oz (87.5 kg) IBW/kg (Calculated) : 59.3 Heparin Dosing Weight: 76.2 kg  Vital Signs: Temp: 97.8 F (36.6 C) (01/09 0414) Temp Source: Oral (01/09 0414) BP: 114/58 (01/09 0414) Pulse Rate: 70 (01/09 0414)  Labs: Recent Labs    10/04/19 1649 10/04/19 1832 10/04/19 2200 10/05/19 0516 10/05/19 1451  HGB  --  11.1*  --  10.1*  --   HCT  --  34.7*  --  31.4*  --   PLT  --  342  --  305  --   APTT  --  33  --   --   --   LABPROT  --  13.4  --  13.9  --   INR  --  1.0  --  1.1  --   HEPARINUNFRC  --   --   --  0.21* 0.47  CREATININE 0.50 0.62  --  0.57  --   TROPONINIHS  --  3 4  --   --     Estimated Creatinine Clearance: 71.9 mL/min (by C-G formula based on SCr of 0.57 mg/dL).   Assessment: 74 yo female with acute bilateral PEs on outpt CT. No right heart strain.  No oral anticoagulants noted on PTA med list.   Heparin Course: 1/9 0516 HL 0.21 1/9 1451 HL 0.47  Goal of Therapy:  Monitor platelets by anticoagulation protocol: Yes   Plan:  Will stop heparin drip with first dose of Eliquis  Will dose Eliquis 10mg  bid x 7 days, followed by Eliquis 5mg  bid thereafter  Pharmacy will  continue to follow CBC/Scr a minimum of every 3 days per protocol.   Lu Duffel, PharmD, BCPS Clinical Pharmacist 10/05/2019 3:12 PM

## 2019-10-05 NOTE — Progress Notes (Signed)
*  PRELIMINARY RESULTS* Echocardiogram 2D Echocardiogram has been performed.  Dana Bautista 10/05/2019, 10:20 AM

## 2019-10-05 NOTE — Progress Notes (Signed)
ANTICOAGULATION CONSULT NOTE - Initial Consult  Pharmacy Consult for heparin  Indication: pulmonary embolus  Allergies  Allergen Reactions  . Codeine Nausea And Vomiting  . Levaquin [Levofloxacin] Hives  . Penicillins Hives and Other (See Comments)    Did it involve swelling of the face/tongue/throat, SOB, or low BP? Unknown Did it involve sudden or severe rash/hives, skin peeling, or any reaction on the inside of your mouth or nose? Yes Did you need to seek medical attention at a hospital or doctor's office? Yes When did it last happen?many years If all above answers are "NO", may proceed with cephalosporin use.   . Sulfur Other (See Comments)    Lip swelling, difficulty breathing  . Latex Rash    Patient Measurements: Height: 5\' 6"  (167.6 cm) Weight: 192 lb 14.4 oz (87.5 kg) IBW/kg (Calculated) : 59.3 Heparin Dosing Weight: 76.2 kg  Vital Signs: Temp: 97.8 F (36.6 C) (01/09 0414) Temp Source: Oral (01/09 0414) BP: 114/58 (01/09 0414) Pulse Rate: 70 (01/09 0414)  Labs: Recent Labs    10/04/19 1649 10/04/19 1832 10/04/19 2200 10/05/19 0516  HGB  --  11.1*  --  10.1*  HCT  --  34.7*  --  31.4*  PLT  --  342  --  305  APTT  --  33  --   --   LABPROT  --  13.4  --   --   INR  --  1.0  --   --   HEPARINUNFRC  --   --   --  0.21*  CREATININE 0.50 0.62  --  0.57  TROPONINIHS  --  3 4  --     Estimated Creatinine Clearance: 71.9 mL/min (by C-G formula based on SCr of 0.57 mg/dL).   Assessment: 74 yo female with acute bilateral PEs on outpt CT. No right heart strain.  No oral anticoagulants noted on PTA med list.   Heparin Course: 1/9 0516 HL 0.21  Goal of Therapy:  Heparin level 0.3-0.7 units/ml Monitor platelets by anticoagulation protocol: Yes   Plan:  1/9 0516 HL 0.21, subtherapeutic. Will order Heparin 1100 bolus and increase drip to 1450 units/hr. Next HL in 8 hours. Daily CBC while on Heparin drip.  Pharmacy will continue to follow.   Paulina Fusi, PharmD, BCPS 10/05/2019 6:38 AM

## 2020-03-27 ENCOUNTER — Ambulatory Visit
Admission: RE | Admit: 2020-03-27 | Discharge: 2020-03-27 | Disposition: A | Discharge status: Home / Self Care | Payer: Medicare Other | Source: Ambulatory Visit

## 2020-03-27 ENCOUNTER — Other Ambulatory Visit: Payer: Self-pay

## 2020-03-27 VITALS — BP 137/57 | HR 69 | Temp 98.2°F | Resp 18 | Ht 66.0 in | Wt 200.0 lb

## 2020-03-27 DIAGNOSIS — R21 Rash and other nonspecific skin eruption: Secondary | ICD-10-CM | POA: Diagnosis not present

## 2020-03-27 DIAGNOSIS — L299 Pruritus, unspecified: Secondary | ICD-10-CM

## 2020-03-27 MED ORDER — DEXAMETHASONE SODIUM PHOSPHATE 10 MG/ML IJ SOLN
10.0000 mg | Freq: Once | INTRAMUSCULAR | Status: AC
  Administered 2020-03-27: 10 mg via INTRAMUSCULAR

## 2020-03-27 MED ORDER — TRIAMCINOLONE ACETONIDE 0.1 % EX CREA: 1.0000 "application " | TOPICAL_CREAM | Freq: Two times a day (BID) | CUTANEOUS | 0 refills | Status: DC

## 2020-03-27 NOTE — ED Triage Notes (Signed)
Pt reports having a rash on bila arms and back x3 days. sts she was woke up from sleep due the itching all over.

## 2020-03-27 NOTE — ED Provider Notes (Signed)
[facility]   [id number] 03/27/20 Arrival Time: 0840  CC: RASH  SUBJECTIVE:  Dana Bautista is a 74 y.o. female who presents with a skin complaint that began 2 days ago.  Reports that she was helping her husband cut limbs out in the yard.  She is unsure if something bit her or if she came in contact with something that she is allergic to.  She has been using peroxide on the rash to help with the itching.  She reports the rash as red and itchy, raised.  Has not tried any oral OTC medications for this.  Has not had a rash like this before.  Symptoms are made worse with scratching.  Denies fever, chills, nausea, vomiting, erythema, swelling, discharge, oral lesions, SOB, chest pain, abdominal pain, changes in bowel or bladder function.    ROS: As per HPI.  All other pertinent ROS negative.     Past Medical History:  Diagnosis Date  . Arthritis    osteoarthriist  . Cancer ([place])    skin  . Complication of anesthesia    nausea  . Heart murmur   . Mitral valve regurgitation   . Tendonitis    outer aspect of right foot  . UTI (lower urinary tract infection)    Past Surgical History:  Procedure Laterality Date  . ABDOMINAL HYSTERECTOMY    . APPENDECTOMY    . FRACTURE SURGERY     left wrist  . FRACTURE SURGERY Left    wrist  . TONSILLECTOMY     Allergies  Allergen Reactions  . Codeine Nausea And Vomiting  . Levaquin [Levofloxacin] Hives  . Penicillins Hives and Other (See Comments)    Did it involve swelling of the face/tongue/throat, SOB, or low BP? Unknown Did it involve sudden or severe rash/hives, skin peeling, or any reaction on the inside of your mouth or nose? Yes Did you need to seek medical attention at a hospital or doctor's office? Yes When did it last happen?many years If all above answers are "NO", may proceed with cephalosporin use.   . Sulfur Other (See Comments)    Lip swelling, difficulty breathing  . Latex Rash   No current facility-administered  medications on file prior to encounter.   Current Outpatient Medications on File Prior to Encounter  Medication Sig Dispense Refill  . apixaban (ELIQUIS) 5 MG TABS tablet Take 2 tablets (10 mg total) by mouth 2 (two) times daily. Take 2 tablets( 10 mg )  2 times a day for 7 days ( until 1/16) followed by 1 tablet (5 mg ) 2 times a day. 60 tablet 3  . ascorbic acid (VITAMIN C) 500 MG tablet Take 500 mg by mouth daily.    . calcium carbonate (OSCAL) 1500 (600 Ca) MG TABS tablet Take 600 mg of elemental calcium by mouth 2 (two) times daily with a meal.    . cyanocobalamin (,VITAMIN B-12,) 1000 MCG/ML injection Inject 1,000 mcg into the muscle every 30 (thirty) days.    Marland Kitchen HYDROcodone-acetaminophen (NORCO/VICODIN) 5-325 MG tablet Take 1 tablet by mouth every 6 (six) hours as needed. (Patient taking differently: Take 1 tablet by mouth every 6 (six) hours as needed for moderate pain. ) 10 tablet 0  . lubiprostone (AMITIZA) 24 MCG capsule Take 24 mcg by mouth daily as needed for constipation.     Marland Kitchen MAGNESIUM-POTASSIUM PO Take 1 tablet by mouth daily.    . ondansetron (ZOFRAN ODT) 4 MG disintegrating tablet Take 1 tablet (4 mg  total) by mouth every 8 (eight) hours as needed for nausea or vomiting. 20 tablet 0  . VITAMIN D, CHOLECALCIFEROL, PO Take by mouth.    . Vitamin D, Ergocalciferol, (DRISDOL) 50000 units CAPS capsule Take 50,000 Units by mouth every 7 (seven) days.     Social History   Socioeconomic History  . Marital status: Married    Spouse name: Not on file  . Number of children: Not on file  . Years of education: Not on file  . Highest education level: Not on file  Occupational History  . Not on file  Tobacco Use  . Smoking status: Never Smoker  . Smokeless tobacco: Never Used  Vaping Use  . Vaping Use: Never used  Substance and Sexual Activity  . Alcohol use: No  . Drug use: No  . Sexual activity: Not on file  Other Topics Concern  . Not on file  Social History Narrative  .  Not on file   Social Determinants of Health   Financial Resource Strain:   . Difficulty of Paying Living Expenses:   Food Insecurity:   . Worried About Charity fundraiser in the Last Year:   . Arboriculturist in the Last Year:   Transportation Needs:   . Film/video editor (Medical):   Marland Kitchen Lack of Transportation (Non-Medical):   Physical Activity:   . Days of Exercise per Week:   . Minutes of Exercise per Session:   Stress:   . Feeling of Stress :   Social Connections:   . Frequency of Communication with Friends and Family:   . Frequency of Social Gatherings with Friends and Family:   . Attends Religious Services:   . Active Member of Clubs or Organizations:   . Attends Archivist Meetings:   Marland Kitchen Marital Status:   Intimate Partner Violence:   . Fear of Current or Ex-Partner:   . Emotionally Abused:   Marland Kitchen Physically Abused:   . Sexually Abused:    Family History  Problem Relation Age of Onset  . Breast cancer Mother   . Asthma Mother   . Lung cancer Father   . Heart disease Brother   . Heart disease Maternal Grandfather   . Throat cancer Maternal Grandfather     OBJECTIVE: Vitals:   03/27/20 0847 03/27/20 0849  BP: (!) 137/57   Pulse: 69   Resp: 18   Temp: 98.2 F (36.8 C)   TempSrc: Oral   SpO2: 99%   Weight:  200 lb (90.7 kg)  Height:  5\' 6"  (1.676 m)    General appearance: alert; no distress Head: NCAT Lungs: clear to auscultation bilaterally Heart: regular rate and rhythm.  Radial pulse 2+ bilaterally Extremities: no edema Skin: warm and dry; maculopapular erythematous rash, consistent with contact dermatitis Psychological: alert and cooperative; normal mood and affect  ASSESSMENT & PLAN:  1. Rash   2. Pruritus     Meds ordered this encounter  Medications  . dexamethasone (DECADRON) injection 10 mg  . triamcinolone cream (KENALOG) 0.1 %    Sig: Apply 1 application topically 2 (two) times daily.    Dispense:  30 g    Refill:  0     Order Specific Question:   Supervising Provider    Answer:   Chase Picket [id number]    Decadron 10 mg IM given in office today Benadryl at night for itch relief Triamcinolone 0.1% (corticosteroid - itch/ inflammation relief) Take as prescribed and to  completion Avoid hot showers/ baths Moisturize skin daily  Follow up with PCP if symptoms persists Return or go to the ER if you have any new or worsening symptoms such as fever, chills, nausea, vomiting, redness, swelling, discharge, if symptoms do not improve with medications  Reviewed expectations re: course of current medical issues. Questions answered. Outlined signs and symptoms indicating need for more acute intervention. Patient verbalized understanding. After Visit Summary given.   Faustino Congress, NP 03/27/20 [phone]

## 2020-03-27 NOTE — Discharge Instructions (Signed)
Use the steroid cream no more than three times per day.  Follow up with your primary care provider, or to see [place] as needed.  Report to the emergency room for shortness of breath, high fever, severe diarrhea, or other concerning symptoms.

## 2020-04-14 ENCOUNTER — Inpatient Hospital Stay: Payer: Medicare Other

## 2020-04-14 ENCOUNTER — Inpatient Hospital Stay: Payer: Medicare Other | Attending: Oncology | Admitting: Oncology

## 2020-04-14 ENCOUNTER — Encounter: Payer: Self-pay | Admitting: Oncology

## 2020-04-14 ENCOUNTER — Other Ambulatory Visit: Payer: Self-pay

## 2020-04-14 VITALS — BP 105/66 | HR 64 | Temp 96.9°F | Resp 18 | Wt 209.5 lb

## 2020-04-14 DIAGNOSIS — Z86718 Personal history of other venous thrombosis and embolism: Secondary | ICD-10-CM | POA: Diagnosis not present

## 2020-04-14 DIAGNOSIS — M7989 Other specified soft tissue disorders: Secondary | ICD-10-CM | POA: Diagnosis not present

## 2020-04-14 DIAGNOSIS — Z7901 Long term (current) use of anticoagulants: Secondary | ICD-10-CM | POA: Insufficient documentation

## 2020-04-14 DIAGNOSIS — D649 Anemia, unspecified: Secondary | ICD-10-CM

## 2020-04-14 DIAGNOSIS — Z86711 Personal history of pulmonary embolism: Secondary | ICD-10-CM | POA: Diagnosis not present

## 2020-04-14 NOTE — Progress Notes (Signed)
Patient here for hematology evaluation.

## 2020-04-14 NOTE — Progress Notes (Addendum)
Hematology/Oncology Consult note [facility] [phone] [phone]   Patient Care Team: Marinda Elk, MD as PCP - General (Physician Assistant)  REFERRING PROVIDER: Marinda Elk, MD  CHIEF COMPLAINTS/REASON FOR VISIT:  Evaluation of pulmonary  embolism  HISTORY OF PRESENTING ILLNESS:   Dana Bautista is a  74 y.o.  female with PMH listed below was seen in consultation at the request of  Marinda Elk, MD  for evaluation of pulmonary embolism  Patient was admitted from 10/04/2019-10/05/2019 due to acute bilateral pulmonary embolism and DVT.  Patient denies any immobilization factors prior to the events.  Her initial symptom was sudden onset of left-sided chest pain. 10/04/2019, CT chest angiogram showed acute bilateral lower lobe pulmonary embolism.  No right heart strain.  Small left pleural effusion. 10/05/2019 bilateral lower extremity ultrasound showed right calf posterior calf occlusive DVT.  Very low thrombus burden.  Patient was started on anticoagulation and discharged on Eliquis. She has been on anticoagulation for slightly more than 6 months. She was referred to hematology for evaluation management. Patient reports that the left chest pain has completely resolved.  She was less active since the diagnosis of pulmonary embolism and has been deconditioned.  She has tried to exercise more and her exercise endurance has improved. Intermittently she experienced left lower extremity swelling. She denies any constitutional symptoms. Review of Systems  Constitutional: Negative for appetite change, chills, fatigue and fever.  HENT:   Negative for hearing loss and voice change.   Eyes: Negative for eye problems.  Respiratory: Negative for chest tightness and cough.   Cardiovascular: Negative for chest pain.  Gastrointestinal: Negative for abdominal distention, abdominal pain and blood in stool.  Endocrine: Negative for hot flashes.   Genitourinary: Negative for difficulty urinating and frequency.   Musculoskeletal: Negative for arthralgias.  Skin: Negative for itching and rash.  Neurological: Negative for extremity weakness.  Hematological: Negative for adenopathy.  Psychiatric/Behavioral: Negative for confusion.    MEDICAL HISTORY:  Past Medical History:  Diagnosis Date  . Arthritis    osteoarthriist  . Cancer ([place])    skin  . Complication of anesthesia    nausea  . DVT (deep venous thrombosis) ([place]) 09/2019  . Heart murmur   . Mitral valve regurgitation   . Pulmonary embolism ([place])   . Tendonitis    outer aspect of right foot  . UTI (lower urinary tract infection)     SURGICAL HISTORY: Past Surgical History:  Procedure Laterality Date  . ABDOMINAL HYSTERECTOMY    . APPENDECTOMY    . FRACTURE SURGERY     left wrist  . FRACTURE SURGERY Left    wrist  . TONSILLECTOMY      SOCIAL HISTORY: Social History   Socioeconomic History  . Marital status: Married    Spouse name: Not on file  . Number of children: Not on file  . Years of education: Not on file  . Highest education level: Not on file  Occupational History  . Not on file  Tobacco Use  . Smoking status: Never Smoker  . Smokeless tobacco: Never Used  Vaping Use  . Vaping Use: Never used  Substance and Sexual Activity  . Alcohol use: No  . Drug use: No  . Sexual activity: Not on file  Other Topics Concern  . Not on file  Social History Narrative  . Not on file   Social Determinants of Health   Financial Resource Strain:   . Difficulty of  Paying Living Expenses:   Food Insecurity:   . Worried About Charity fundraiser in the Last Year:   . Arboriculturist in the Last Year:   Transportation Needs:   . Film/video editor (Medical):   Marland Kitchen Lack of Transportation (Non-Medical):   Physical Activity:   . Days of Exercise per Week:   . Minutes of Exercise per Session:   Stress:   . Feeling of Stress :   Social Connections:    . Frequency of Communication with Friends and Family:   . Frequency of Social Gatherings with Friends and Family:   . Attends Religious Services:   . Active Member of Clubs or Organizations:   . Attends Archivist Meetings:   Marland Kitchen Marital Status:   Intimate Partner Violence:   . Fear of Current or Ex-Partner:   . Emotionally Abused:   Marland Kitchen Physically Abused:   . Sexually Abused:     FAMILY HISTORY: Family History  Problem Relation Age of Onset  . Breast cancer Mother   . Asthma Mother   . Lung cancer Father   . Heart disease Brother   . Heart disease Maternal Grandfather   . Throat cancer Maternal Grandfather     ALLERGIES:  is allergic to codeine, levaquin [levofloxacin], penicillins, sulfur, and latex.  MEDICATIONS:  Current Outpatient Medications  Medication Sig Dispense Refill  . apixaban (ELIQUIS) 5 MG TABS tablet Take 5 mg by mouth 2 (two) times daily.    Marland Kitchen ascorbic acid (VITAMIN C) 500 MG tablet Take 500 mg by mouth daily.    . calcium carbonate (OSCAL) 1500 (600 Ca) MG TABS tablet Take 600 mg of elemental calcium by mouth 2 (two) times daily with a meal.    . cyanocobalamin (,VITAMIN B-12,) 1000 MCG/ML injection Inject 1,000 mcg into the muscle every 30 (thirty) days.    Marland Kitchen lubiprostone (AMITIZA) 24 MCG capsule Take 24 mcg by mouth daily as needed for constipation.     . triamcinolone cream (KENALOG) 0.1 % Apply 1 application topically 2 (two) times daily. 30 g 0  . VITAMIN D, CHOLECALCIFEROL, PO Take by mouth.    Marland Kitchen apixaban (ELIQUIS) 5 MG TABS tablet Take 2 tablets (10 mg total) by mouth 2 (two) times daily. Take 2 tablets( 10 mg )  2 times a day for 7 days ( until 1/16) followed by 1 tablet (5 mg ) 2 times a day. 60 tablet 3  . HYDROcodone-acetaminophen (NORCO/VICODIN) 5-325 MG tablet Take 1 tablet by mouth every 6 (six) hours as needed. (Patient not taking: Reported on 04/14/2020) 10 tablet 0  . MAGNESIUM-POTASSIUM PO Take 1 tablet by mouth daily. (Patient not  taking: Reported on 04/14/2020)    . ondansetron (ZOFRAN ODT) 4 MG disintegrating tablet Take 1 tablet (4 mg total) by mouth every 8 (eight) hours as needed for nausea or vomiting. (Patient not taking: Reported on 04/14/2020) 20 tablet 0  . Vitamin D, Ergocalciferol, (DRISDOL) 50000 units CAPS capsule Take 50,000 Units by mouth every 7 (seven) days. (Patient not taking: Reported on 04/14/2020)     No current facility-administered medications for this visit.     PHYSICAL EXAMINATION: ECOG PERFORMANCE STATUS: 0 - Asymptomatic Vitals:   04/14/20 0939  BP: 105/66  Pulse: 64  Resp: 18  Temp: (!) 96.9 F (36.1 C)   Filed Weights   04/14/20 0939  Weight: 209 lb 8 oz (95 kg)    Physical Exam Constitutional:  General: She is not in acute distress. HENT:     Head: Normocephalic and atraumatic.  Eyes:     General: No scleral icterus. Cardiovascular:     Rate and Rhythm: Normal rate and regular rhythm.     Heart sounds: Normal heart sounds.  Pulmonary:     Effort: Pulmonary effort is normal. No respiratory distress.     Breath sounds: No wheezing.  Abdominal:     General: Bowel sounds are normal. There is no distension.     Palpations: Abdomen is soft.  Musculoskeletal:        General: No deformity. Normal range of motion.     Cervical back: Normal range of motion and neck supple.  Skin:    General: Skin is warm and dry.     Findings: No erythema or rash.  Neurological:     Mental Status: She is alert and oriented to person, place, and time. Mental status is at baseline.     Cranial Nerves: No cranial nerve deficit.     Coordination: Coordination normal.  Psychiatric:        Mood and Affect: Mood normal.     LABORATORY DATA:  I have reviewed the data as listed Lab Results  Component Value Date   WBC 6.5 10/05/2019   HGB 10.1 (L) 10/05/2019   HCT 31.4 (L) 10/05/2019   MCV 86.7 10/05/2019   PLT 305 10/05/2019   Recent Labs    10/04/19 1649 10/04/19 1832  10/05/19 0516  NA  --  133* 136  K  --  3.6 3.8  CL  --  96* 100  CO2  --  28 25  GLUCOSE  --  123* 97  BUN  --  10 8  CREATININE 0.50 0.62 0.57  CALCIUM  --  9.2 8.8*  GFRNONAA  --  >60 >60  GFRAA  --  >60 >60  PROT  --  7.6  --   ALBUMIN  --  3.7  --   AST  --  10*  --   ALT  --  8  --   ALKPHOS  --  64  --   BILITOT  --  1.2  --    Iron/TIBC/Ferritin/ %Sat    Component Value Date/Time   FERRITIN 176 10/04/2019 1832      RADIOGRAPHIC STUDIES: I have personally reviewed the radiological images as listed and agreed with the findings in the report. No results found.    ASSESSMENT & PLAN:  1. 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 History of pulmonary embolism    #CT images and ultrasound images were independently reviewed by me and discussed with patient. Unprovoked acute bilateral pulmonary embolism in January 2021, status post 6 months+ anticoagulation with Eliquis. Clinically she is doing very well. 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  She has now finished 6 months of anticoagulation. I recommend long-term anticoagulation with Eliquis 2.5 mg twice daily. Hypercoagulable work-up can be done however less likely will change her management plan.  Discussed with patient. I will obtain left lower extremity ultrasound for follow-up to see if DVT has improved. After that, patient will stop Eliquis 5 mg twice daily for 3 days, do hypercoagulable work-up, and then switch to Eliquis 2.5 mg twice daily.   Orders Placed This Encounter  Procedures  . US Venous Img Lower Unilateral Left    Standing Status:  Future    Standing Expiration Date:   04/14/2021    Order Specific Question:   Reason for Exam (SYMPTOM  OR DIAGNOSIS REQUIRED)    Answer:   history of DVT, follow up    Order Specific Question:   Preferred imaging location?    Answer:   [facility]    All questions  were answered. The patient knows to call the clinic with any problems questions or concerns.  cc Marinda Elk, MD    Return of visit: To be determined Thank you for this kind referral and the opportunity to participate in the care of this patient. A copy of today's note is routed to referring provider    Earlie Server, MD, PhD Hematology Oncology [facility] Pager- [phone] 04/14/2020

## 2020-04-16 ENCOUNTER — Other Ambulatory Visit: Payer: Self-pay | Admitting: Oncology

## 2020-04-16 DIAGNOSIS — Z86718 Personal history of other venous thrombosis and embolism: Secondary | ICD-10-CM

## 2020-04-17 ENCOUNTER — Other Ambulatory Visit: Payer: Self-pay

## 2020-04-17 ENCOUNTER — Ambulatory Visit
Admission: RE | Admit: 2020-04-17 | Discharge: 2020-04-17 | Disposition: A | Discharge status: Home / Self Care | Payer: Medicare Other | Source: Ambulatory Visit | Attending: Oncology | Admitting: Oncology

## 2020-04-17 DIAGNOSIS — Z86718 Personal history of other venous thrombosis and embolism: Secondary | ICD-10-CM | POA: Insufficient documentation

## 2020-04-17 MED ORDER — APIXABAN 2.5 MG PO TABS
2.5000 mg | ORAL_TABLET | Freq: Two times a day (BID) | ORAL | 5 refills | Status: DC
Start: 2020-04-17 — End: 2020-10-20

## 2020-04-17 NOTE — Addendum Note (Signed)
Addended by: Earlie Server on: 04/17/2020 11:39 PM   Modules accepted: Orders

## 2020-04-20 ENCOUNTER — Telehealth: Payer: Self-pay

## 2020-04-20 NOTE — Telephone Encounter (Signed)
-----   Message from Earlie Server, MD sent at 04/17/2020 11:34 PM EDT ----- Please let her know that repeat US showed no blood clots and I recommend her to stop Eliquis 5mg  BID, wait 3 days and do blood work- ordered. After blood work, I suggest her to start Eliquis 2.5mg  BID. Rx was sent to her pharmacy. Thanks.  Follow up plan: cbc cmp Lab MD in 6 months.

## 2020-04-20 NOTE — Telephone Encounter (Signed)
Pt notified and verbalized understanding of switching to 2.5 mg after labs. Pt states that she has some things going on this week and would like to continue on Eliquis until 8/2.

## 2020-04-20 NOTE — Telephone Encounter (Signed)
Done. Appts has been sched as requested I called pt and made her aware of all her sched appt dates and times.

## 2020-05-01 ENCOUNTER — Inpatient Hospital Stay: Payer: Medicare Other | Attending: Oncology | Admitting: Oncology

## 2020-05-01 ENCOUNTER — Other Ambulatory Visit: Payer: Self-pay

## 2020-05-01 DIAGNOSIS — Z86711 Personal history of pulmonary embolism: Secondary | ICD-10-CM | POA: Diagnosis present

## 2020-05-01 DIAGNOSIS — Z7901 Long term (current) use of anticoagulants: Secondary | ICD-10-CM | POA: Insufficient documentation

## 2020-05-01 DIAGNOSIS — Z79899 Other long term (current) drug therapy: Secondary | ICD-10-CM | POA: Insufficient documentation

## 2020-05-01 DIAGNOSIS — Z86718 Personal history of other venous thrombosis and embolism: Secondary | ICD-10-CM | POA: Diagnosis present

## 2020-05-01 DIAGNOSIS — D72829 Elevated white blood cell count, unspecified: Secondary | ICD-10-CM | POA: Diagnosis not present

## 2020-05-01 DIAGNOSIS — D649 Anemia, unspecified: Secondary | ICD-10-CM | POA: Diagnosis not present

## 2020-05-01 LAB — CBC WITH DIFFERENTIAL/PLATELET
Abs Immature Granulocytes: 0.03 10*3/uL (ref 0.00–0.07)
Basophils Absolute: 0 10*3/uL (ref 0.0–0.1)
Basophils Relative: 1 %
Eosinophils Absolute: 0 10*3/uL (ref 0.0–0.5)
Eosinophils Relative: 1 %
HCT: 33.7 % — ABNORMAL LOW (ref 36.0–46.0)
Hemoglobin: 11.1 g/dL — ABNORMAL LOW (ref 12.0–15.0)
Immature Granulocytes: 1 %
Lymphocytes Relative: 31 %
Lymphs Abs: 1.8 10*3/uL (ref 0.7–4.0)
MCH: 28.1 pg (ref 26.0–34.0)
MCHC: 32.9 g/dL (ref 30.0–36.0)
MCV: 85.3 fL (ref 80.0–100.0)
Monocytes Absolute: 0.4 10*3/uL (ref 0.1–1.0)
Monocytes Relative: 7 %
Neutro Abs: 3.5 10*3/uL (ref 1.7–7.7)
Neutrophils Relative %: 59 %
Platelets: 261 10*3/uL (ref 150–400)
RBC: 3.95 MIL/uL (ref 3.87–5.11)
RDW: 13.3 % (ref 11.5–15.5)
WBC: 5.8 10*3/uL (ref 4.0–10.5)
nRBC: 0 % (ref 0.0–0.2)

## 2020-05-01 LAB — RETIC PANEL
Immature Retic Fract: 4 % (ref 2.3–15.9)
RBC.: 3.97 MIL/uL (ref 3.87–5.11)
Retic Count, Absolute: 54.4 10*3/uL (ref 19.0–186.0)
Retic Ct Pct: 1.4 % (ref 0.4–3.1)
Reticulocyte Hemoglobin: 32.1 pg (ref >27.9)

## 2020-05-01 LAB — COMPREHENSIVE METABOLIC PANEL
ALT: 11 U/L (ref 0–44)
AST: 16 U/L (ref 15–41)
Albumin: 4.3 g/dL (ref 3.5–5.0)
Alkaline Phosphatase: 57 U/L (ref 38–126)
Anion gap: 10 (ref 5–15)
BUN: 19 mg/dL (ref 8–23)
CO2: 24 mmol/L (ref 22–32)
Calcium: 9.2 mg/dL (ref 8.9–10.3)
Chloride: 104 mmol/L (ref 98–111)
Creatinine, Ser: 0.73 mg/dL (ref 0.44–1.00)
GFR calc Af Amer: >60 mL/min (ref >60)
GFR calc non Af Amer: >60 mL/min (ref >60)
Glucose, Bld: 86 mg/dL (ref 70–99)
Potassium: 4.4 mmol/L (ref 3.5–5.1)
Sodium: 138 mmol/L (ref 135–145)
Total Bilirubin: 1.3 mg/dL — ABNORMAL HIGH (ref 0.3–1.2)
Total Protein: 7.2 g/dL (ref 6.5–8.1)

## 2020-05-01 LAB — IRON AND TIBC
Iron: 65 ug/dL (ref 28–170)
Saturation Ratios: 19 % (ref 10.4–31.8)
TIBC: 343 ug/dL (ref 250–450)
UIBC: 278 ug/dL

## 2020-05-01 LAB — FERRITIN: Ferritin: 25 ng/mL (ref 11–307)

## 2020-05-02 LAB — ANTIPHOSPHOLIPID SYNDROME PROF
Anticardiolipin IgG: 28 GPL U/mL — ABNORMAL HIGH (ref 0–14)
Anticardiolipin IgM: 12 MPL U/mL (ref 0–12)
DRVVT: 28.1 s (ref 0.0–47.0)
PTT Lupus Anticoagulant: 30.8 s (ref 0.0–51.9)

## 2020-05-05 LAB — FACTOR 5 LEIDEN

## 2020-05-07 LAB — PROTHROMBIN GENE MUTATION

## 2020-06-24 ENCOUNTER — Other Ambulatory Visit: Payer: Self-pay | Admitting: Physician Assistant

## 2020-06-24 DIAGNOSIS — M549 Dorsalgia, unspecified: Secondary | ICD-10-CM

## 2020-06-24 DIAGNOSIS — S22060A Wedge compression fracture of T7-T8 vertebra, initial encounter for closed fracture: Secondary | ICD-10-CM

## 2020-09-14 ENCOUNTER — Other Ambulatory Visit: Payer: Self-pay

## 2020-09-14 ENCOUNTER — Ambulatory Visit
Admission: RE | Admit: 2020-09-14 | Discharge: 2020-09-14 | Disposition: A | Discharge status: Home / Self Care | Payer: Medicare Other | Source: Ambulatory Visit | Attending: Physician Assistant | Admitting: Physician Assistant

## 2020-09-14 DIAGNOSIS — M549 Dorsalgia, unspecified: Secondary | ICD-10-CM | POA: Insufficient documentation

## 2020-09-14 DIAGNOSIS — S22060A Wedge compression fracture of T7-T8 vertebra, initial encounter for closed fracture: Secondary | ICD-10-CM | POA: Diagnosis present

## 2020-10-20 ENCOUNTER — Other Ambulatory Visit: Payer: Self-pay

## 2020-10-20 ENCOUNTER — Encounter: Payer: Self-pay | Admitting: Oncology

## 2020-10-20 ENCOUNTER — Inpatient Hospital Stay (HOSPITAL_BASED_OUTPATIENT_CLINIC_OR_DEPARTMENT_OTHER): Payer: Medicare Other | Admitting: Oncology

## 2020-10-20 ENCOUNTER — Inpatient Hospital Stay: Payer: Medicare Other | Attending: Oncology

## 2020-10-20 VITALS — BP 110/66 | HR 65 | Temp 98.4°F | Resp 18 | Wt 193.9 lb

## 2020-10-20 DIAGNOSIS — Z86711 Personal history of pulmonary embolism: Secondary | ICD-10-CM | POA: Insufficient documentation

## 2020-10-20 DIAGNOSIS — Z79899 Other long term (current) drug therapy: Secondary | ICD-10-CM | POA: Insufficient documentation

## 2020-10-20 DIAGNOSIS — Z86718 Personal history of other venous thrombosis and embolism: Secondary | ICD-10-CM | POA: Diagnosis present

## 2020-10-20 DIAGNOSIS — Z7901 Long term (current) use of anticoagulants: Secondary | ICD-10-CM | POA: Insufficient documentation

## 2020-10-20 DIAGNOSIS — Z79811 Long term (current) use of aromatase inhibitors: Secondary | ICD-10-CM | POA: Diagnosis not present

## 2020-10-20 DIAGNOSIS — M1990 Unspecified osteoarthritis, unspecified site: Secondary | ICD-10-CM | POA: Diagnosis not present

## 2020-10-20 LAB — CBC WITH DIFFERENTIAL/PLATELET
Abs Immature Granulocytes: 0.01 10*3/uL (ref 0.00–0.07)
Basophils Absolute: 0 10*3/uL (ref 0.0–0.1)
Basophils Relative: 1 %
Eosinophils Absolute: 0.2 10*3/uL (ref 0.0–0.5)
Eosinophils Relative: 3 %
HCT: 34.9 % — ABNORMAL LOW (ref 36.0–46.0)
Hemoglobin: 11.4 g/dL — ABNORMAL LOW (ref 12.0–15.0)
Immature Granulocytes: 0 %
Lymphocytes Relative: 41 %
Lymphs Abs: 2.4 10*3/uL (ref 0.7–4.0)
MCH: 28.1 pg (ref 26.0–34.0)
MCHC: 32.7 g/dL (ref 30.0–36.0)
MCV: 86.2 fL (ref 80.0–100.0)
Monocytes Absolute: 0.5 10*3/uL (ref 0.1–1.0)
Monocytes Relative: 8 %
Neutro Abs: 2.8 10*3/uL (ref 1.7–7.7)
Neutrophils Relative %: 47 %
Platelets: 299 10*3/uL (ref 150–400)
RBC: 4.05 MIL/uL (ref 3.87–5.11)
RDW: 14.8 % (ref 11.5–15.5)
WBC: 5.9 10*3/uL (ref 4.0–10.5)
nRBC: 0 % (ref 0.0–0.2)

## 2020-10-20 LAB — COMPREHENSIVE METABOLIC PANEL
ALT: 9 U/L (ref 0–44)
AST: 14 U/L — ABNORMAL LOW (ref 15–41)
Albumin: 4.1 g/dL (ref 3.5–5.0)
Alkaline Phosphatase: 46 U/L (ref 38–126)
Anion gap: 10 (ref 5–15)
BUN: 15 mg/dL (ref 8–23)
CO2: 25 mmol/L (ref 22–32)
Calcium: 9.4 mg/dL (ref 8.9–10.3)
Chloride: 103 mmol/L (ref 98–111)
Creatinine, Ser: 0.58 mg/dL (ref 0.44–1.00)
GFR, Estimated: >60 mL/min (ref >60)
Glucose, Bld: 98 mg/dL (ref 70–99)
Potassium: 4.4 mmol/L (ref 3.5–5.1)
Sodium: 138 mmol/L (ref 135–145)
Total Bilirubin: 1 mg/dL (ref 0.3–1.2)
Total Protein: 7.3 g/dL (ref 6.5–8.1)

## 2020-10-20 MED ORDER — APIXABAN 2.5 MG PO TABS: 2.5000 mg | ORAL_TABLET | Freq: Two times a day (BID) | ORAL | 1 refills | Status: DC

## 2020-10-20 MED ORDER — APIXABAN 2.5 MG PO TABS: 2.5000 mg | ORAL_TABLET | Freq: Two times a day (BID) | ORAL | 5 refills | Status: DC

## 2020-10-20 NOTE — Progress Notes (Signed)
Pt here for follow up. No new concerns voiced.   

## 2020-10-20 NOTE — Progress Notes (Signed)
Hematology/Oncology Consult note [facility] Telephone:([phone] [phone]   Patient Care Team: Marinda Elk, MD as PCP - General (Physician Assistant)  REFERRING PROVIDER: Marinda Elk, MD  CHIEF COMPLAINTS/REASON FOR VISIT:  Evaluation of pulmonary  embolism  HISTORY OF PRESENTING ILLNESS:   Dana Bautista is a  75 y.o.  female with PMH listed below was seen in consultation at the request of  Marinda Elk, MD  for evaluation of pulmonary embolism  Patient was admitted from 10/04/2019-10/05/2019 due to acute bilateral pulmonary embolism and DVT.  Patient denies any immobilization factors prior to the events.  Her initial symptom was sudden onset of left-sided chest pain. 10/04/2019, CT chest angiogram showed acute bilateral lower lobe pulmonary embolism.  No right heart strain.  Small left pleural effusion. 10/05/2019 bilateral lower extremity ultrasound showed right calf posterior calf occlusive DVT.  Very low thrombus burden.  Patient was started on anticoagulation and discharged on Eliquis. She has been on anticoagulation for slightly more than 6 months. She was referred to hematology for evaluation management. Patient reports that the left chest pain has completely resolved.  She was less active since the diagnosis of pulmonary embolism and has been deconditioned.  She has tried to exercise more and her exercise endurance has improved. Intermittently she experienced left lower extremity swelling. She denies any constitutional symptoms.  July 2021 -switched to Eliquis 2.5mg  BID  INTERVAL HISTORY Dana Bautista is a 75 y.o. female who has above history reviewed by me today presents for follow up visit for management of history of thrombosis.  Problems and complaints are listed below: Patient has been on Eliquis 2.5mg  BID. She tolerates well.  She reports intermittent left lower extremity swelling. Right lower extremity no swelling.  No  bleeding events.  Review of Systems  Constitutional: Negative for appetite change, chills, fatigue and fever.  HENT:   Negative for hearing loss and voice change.   Eyes: Negative for eye problems.  Respiratory: Negative for chest tightness and cough.   Cardiovascular: Negative for chest pain.  Gastrointestinal: Negative for abdominal distention, abdominal pain and blood in stool.  Endocrine: Negative for hot flashes.  Genitourinary: Negative for difficulty urinating and frequency.   Musculoskeletal: Negative for arthralgias.  Skin: Negative for itching and rash.  Neurological: Negative for extremity weakness.  Hematological: Negative for adenopathy.  Psychiatric/Behavioral: Negative for confusion.    MEDICAL HISTORY:  Past Medical History:  Diagnosis Date  . Arthritis    osteoarthriist  . Cancer ([place])    skin  . Complication of anesthesia    nausea  . DVT (deep venous thrombosis) ([place]) 09/2019  . Heart murmur   . Mitral valve regurgitation   . Pulmonary embolism ([place])   . Tendonitis    outer aspect of right foot  . UTI (lower urinary tract infection)     SURGICAL HISTORY: Past Surgical History:  Procedure Laterality Date  . ABDOMINAL HYSTERECTOMY    . APPENDECTOMY    . FRACTURE SURGERY     left wrist  . FRACTURE SURGERY Left    wrist  . TONSILLECTOMY      SOCIAL HISTORY: Social History   Socioeconomic History  . Marital status: Married    Spouse name: Not on file  . Number of children: Not on file  . Years of education: Not on file  . Highest education level: Not on file  Occupational History  . Not on file  Tobacco Use  . Smoking status: Never  Smoker  . Smokeless tobacco: Never Used  Vaping Use  . Vaping Use: Never used  Substance and Sexual Activity  . Alcohol use: No  . Drug use: No  . Sexual activity: Not on file  Other Topics Concern  . Not on file  Social History Narrative  . Not on file   Social Determinants of Health   Financial  Resource Strain: Not on file  Food Insecurity: Not on file  Transportation Needs: Not on file  Physical Activity: Not on file  Stress: Not on file  Social Connections: Not on file  Intimate Partner Violence: Not on file    FAMILY HISTORY: Family History  Problem Relation Age of Onset  . Breast cancer Mother   . Asthma Mother   . Lung cancer Father   . Heart disease Brother   . Heart disease Maternal Grandfather   . Throat cancer Maternal Grandfather     ALLERGIES:  is allergic to codeine, elemental sulfur, levaquin [levofloxacin], penicillins, and latex.  MEDICATIONS:  Current Outpatient Medications  Medication Sig Dispense Refill  . ascorbic acid (VITAMIN C) 500 MG tablet Take 500 mg by mouth daily.    . cyanocobalamin (,VITAMIN B-12,) 1000 MCG/ML injection Inject 1,000 mcg into the muscle every 30 (thirty) days.    Marland Kitchen lubiprostone (AMITIZA) 24 MCG capsule Take 24 mcg by mouth daily as needed for constipation.     Marland Kitchen apixaban (ELIQUIS) 2.5 MG TABS tablet Take 1 tablet (2.5 mg total) by mouth 2 (two) times daily. 180 tablet 1  . calcium carbonate (OSCAL) 1500 (600 Ca) MG TABS tablet Take 600 mg of elemental calcium by mouth 2 (two) times daily with a meal. (Patient not taking: Reported on 10/20/2020)    . HYDROcodone-acetaminophen (NORCO/VICODIN) 5-325 MG tablet Take 1 tablet by mouth every 6 (six) hours as needed. (Patient not taking: No sig reported) 10 tablet 0  . MAGNESIUM-POTASSIUM PO Take 1 tablet by mouth daily. (Patient not taking: No sig reported)    . ondansetron (ZOFRAN ODT) 4 MG disintegrating tablet Take 1 tablet (4 mg total) by mouth every 8 (eight) hours as needed for nausea or vomiting. (Patient not taking: No sig reported) 20 tablet 0  . triamcinolone cream (KENALOG) 0.1 % Apply 1 application topically 2 (two) times daily. (Patient not taking: Reported on 10/20/2020) 30 g 0  . VITAMIN D, CHOLECALCIFEROL, PO Take by mouth. (Patient not taking: Reported on 10/20/2020)     . Vitamin D, Ergocalciferol, (DRISDOL) 50000 units CAPS capsule Take 50,000 Units by mouth every 7 (seven) days. (Patient not taking: No sig reported)     No current facility-administered medications for this visit.     PHYSICAL EXAMINATION: ECOG PERFORMANCE STATUS: 0 - Asymptomatic Vitals:   10/20/20 1314  BP: 110/66  Pulse: 65  Resp: 18  Temp: 98.4 F (36.9 C)   Filed Weights   10/20/20 1314  Weight: 193 lb 14.4 oz (88 kg)    Physical Exam Constitutional:      General: She is not in acute distress. HENT:     Head: Normocephalic and atraumatic.  Eyes:     General: No scleral icterus. Cardiovascular:     Rate and Rhythm: Normal rate and regular rhythm.     Heart sounds: Normal heart sounds.  Pulmonary:     Effort: Pulmonary effort is normal. No respiratory distress.     Breath sounds: No wheezing.  Abdominal:     General: Bowel sounds are normal. There is no  distension.     Palpations: Abdomen is soft.  Musculoskeletal:        General: No deformity. Normal range of motion.     Cervical back: Normal range of motion and neck supple.     Comments: Trace edema, bilateral lower extremities.  varicose vein bilaterally.   Skin:    General: Skin is warm and dry.     Findings: No erythema or rash.  Neurological:     Mental Status: She is alert and oriented to person, place, and time. Mental status is at baseline.     Cranial Nerves: No cranial nerve deficit.     Coordination: Coordination normal.  Psychiatric:        Mood and Affect: Mood normal.     LABORATORY DATA:  I have reviewed the data as listed Lab Results  Component Value Date   WBC 5.9 10/20/2020   HGB 11.4 (L) 10/20/2020   HCT 34.9 (L) 10/20/2020   MCV 86.2 10/20/2020   PLT 299 10/20/2020   Recent Labs    05/01/20 1102 10/20/20 1247  NA 138 138  K 4.4 4.4  CL 104 103  CO2 24 25  GLUCOSE 86 98  BUN 19 15  CREATININE 0.73 0.58  CALCIUM 9.2 9.4  GFRNONAA >60 >60  GFRAA >60  --   PROT 7.2  7.3  ALBUMIN 4.3 4.1  AST 16 14*  ALT 11 9  ALKPHOS 57 46  BILITOT 1.3* 1.0   Iron/TIBC/Ferritin/ %Sat    Component Value Date/Time   IRON 65 05/01/2020 1102   TIBC 343 05/01/2020 1102   FERRITIN 25 05/01/2020 1102   IRONPCTSAT 19 05/01/2020 1102      RADIOGRAPHIC STUDIES: I have personally reviewed the radiological images as listed and agreed with the findings in the report. No results found.    ASSESSMENT & PLAN:  1. History of pulmonary embolism   2. History of deep vein thrombosis (DVT) of lower extremity    #History of pulmonary embolism and right lower extremity DVT Continue Eliquis 2.5mg  BID.  Reviewed hypercoagulable state work up.  Negative prothrombin gene mutation, Factor V leiden mutation.  Negative anticardiolipin IgM, indeterminate level of anticardiolipin IgG, not meeting antiphospholipid syndrome diagnosis criteria.   Intermittent lower extremity swelling, likely vein insufficiency.  Recommend leg elevation, compression stocking.   Orders Placed This Encounter  Procedures  . CBC with Differential/Platelet    Standing Status:   Future    Standing Expiration Date:   10/20/2021  . Comprehensive metabolic panel    Standing Status:   Future    Standing Expiration Date:   10/20/2021  . Iron and TIBC    Standing Status:   Future    Standing Expiration Date:   10/20/2021  . Ferritin    Standing Status:   Future    Standing Expiration Date:   10/20/2021    All questions were answered. The patient knows to call the clinic with any problems questions or concerns.  cc Marinda Elk, MD    Return of visit: 6 months.  Earlie Server, MD, PhD Hematology Oncology [facility] at [facility] Pager- [phone] 10/20/2020

## 2021-03-31 DIAGNOSIS — M810 Age-related osteoporosis without current pathological fracture: Secondary | ICD-10-CM | POA: Insufficient documentation

## 2021-04-12 ENCOUNTER — Telehealth: Payer: Self-pay | Admitting: Oncology

## 2021-04-12 NOTE — Telephone Encounter (Signed)
Patient called to r/s appt on 7/22 and 7/25 due to transportation issues. Patient confirmed new day and times.

## 2021-04-13 ENCOUNTER — Other Ambulatory Visit: Payer: Medicare Other

## 2021-04-15 ENCOUNTER — Ambulatory Visit: Payer: Medicare Other | Admitting: Oncology

## 2021-04-16 ENCOUNTER — Other Ambulatory Visit: Payer: Medicare Other

## 2021-04-19 ENCOUNTER — Ambulatory Visit: Payer: Medicare Other | Admitting: Oncology

## 2021-04-27 ENCOUNTER — Other Ambulatory Visit: Payer: Medicare Other

## 2021-04-29 ENCOUNTER — Ambulatory Visit: Payer: Medicare Other | Admitting: Oncology

## 2021-05-25 ENCOUNTER — Inpatient Hospital Stay: Payer: Medicare Other | Attending: Oncology

## 2021-05-25 ENCOUNTER — Other Ambulatory Visit: Payer: Self-pay

## 2021-05-25 DIAGNOSIS — Z86718 Personal history of other venous thrombosis and embolism: Secondary | ICD-10-CM | POA: Insufficient documentation

## 2021-05-25 DIAGNOSIS — Z79899 Other long term (current) drug therapy: Secondary | ICD-10-CM | POA: Diagnosis not present

## 2021-05-25 DIAGNOSIS — D649 Anemia, unspecified: Secondary | ICD-10-CM | POA: Diagnosis not present

## 2021-05-25 DIAGNOSIS — Z7901 Long term (current) use of anticoagulants: Secondary | ICD-10-CM | POA: Insufficient documentation

## 2021-05-25 DIAGNOSIS — Z86711 Personal history of pulmonary embolism: Secondary | ICD-10-CM | POA: Insufficient documentation

## 2021-05-25 LAB — COMPREHENSIVE METABOLIC PANEL
ALT: 10 U/L (ref 0–44)
AST: 15 U/L (ref 15–41)
Albumin: 4.2 g/dL (ref 3.5–5.0)
Alkaline Phosphatase: 61 U/L (ref 38–126)
Anion gap: 6 (ref 5–15)
BUN: 17 mg/dL (ref 8–23)
CO2: 26 mmol/L (ref 22–32)
Calcium: 9.3 mg/dL (ref 8.9–10.3)
Chloride: 102 mmol/L (ref 98–111)
Creatinine, Ser: 0.74 mg/dL (ref 0.44–1.00)
GFR, Estimated: >60 mL/min (ref >60)
Glucose, Bld: 101 mg/dL — ABNORMAL HIGH (ref 70–99)
Potassium: 4.4 mmol/L (ref 3.5–5.1)
Sodium: 134 mmol/L — ABNORMAL LOW (ref 135–145)
Total Bilirubin: 0.8 mg/dL (ref 0.3–1.2)
Total Protein: 7.3 g/dL (ref 6.5–8.1)

## 2021-05-25 LAB — CBC WITH DIFFERENTIAL/PLATELET
Abs Immature Granulocytes: 0.02 10*3/uL (ref 0.00–0.07)
Basophils Absolute: 0 10*3/uL (ref 0.0–0.1)
Basophils Relative: 1 %
Eosinophils Absolute: 0.1 10*3/uL (ref 0.0–0.5)
Eosinophils Relative: 2 %
HCT: 34.7 % — ABNORMAL LOW (ref 36.0–46.0)
Hemoglobin: 11.4 g/dL — ABNORMAL LOW (ref 12.0–15.0)
Immature Granulocytes: 0 %
Lymphocytes Relative: 36 %
Lymphs Abs: 2.2 10*3/uL (ref 0.7–4.0)
MCH: 27.7 pg (ref 26.0–34.0)
MCHC: 32.9 g/dL (ref 30.0–36.0)
MCV: 84.4 fL (ref 80.0–100.0)
Monocytes Absolute: 0.5 10*3/uL (ref 0.1–1.0)
Monocytes Relative: 8 %
Neutro Abs: 3.4 10*3/uL (ref 1.7–7.7)
Neutrophils Relative %: 53 %
Platelets: 281 10*3/uL (ref 150–400)
RBC: 4.11 MIL/uL (ref 3.87–5.11)
RDW: 13.3 % (ref 11.5–15.5)
WBC: 6.3 10*3/uL (ref 4.0–10.5)
nRBC: 0 % (ref 0.0–0.2)

## 2021-05-25 LAB — IRON AND TIBC
Iron: 59 ug/dL (ref 28–170)
Saturation Ratios: 16 % (ref 10.4–31.8)
TIBC: 360 ug/dL (ref 250–450)
UIBC: 301 ug/dL

## 2021-05-25 LAB — FERRITIN: Ferritin: 21 ng/mL (ref 11–307)

## 2021-05-27 ENCOUNTER — Other Ambulatory Visit: Payer: Self-pay

## 2021-05-27 ENCOUNTER — Encounter: Payer: Self-pay | Admitting: Oncology

## 2021-05-27 ENCOUNTER — Inpatient Hospital Stay: Payer: Medicare Other | Attending: Oncology | Admitting: Oncology

## 2021-05-27 VITALS — BP 121/48 | HR 66 | Temp 97.4°F | Resp 18 | Wt 209.7 lb

## 2021-05-27 DIAGNOSIS — Z808 Family history of malignant neoplasm of other organs or systems: Secondary | ICD-10-CM | POA: Insufficient documentation

## 2021-05-27 DIAGNOSIS — Z86718 Personal history of other venous thrombosis and embolism: Secondary | ICD-10-CM | POA: Insufficient documentation

## 2021-05-27 DIAGNOSIS — Z801 Family history of malignant neoplasm of trachea, bronchus and lung: Secondary | ICD-10-CM | POA: Diagnosis not present

## 2021-05-27 DIAGNOSIS — Z803 Family history of malignant neoplasm of breast: Secondary | ICD-10-CM | POA: Diagnosis not present

## 2021-05-27 DIAGNOSIS — J90 Pleural effusion, not elsewhere classified: Secondary | ICD-10-CM | POA: Diagnosis not present

## 2021-05-27 DIAGNOSIS — R200 Anesthesia of skin: Secondary | ICD-10-CM | POA: Diagnosis not present

## 2021-05-27 DIAGNOSIS — Z86711 Personal history of pulmonary embolism: Secondary | ICD-10-CM | POA: Insufficient documentation

## 2021-05-27 DIAGNOSIS — M79651 Pain in right thigh: Secondary | ICD-10-CM | POA: Diagnosis not present

## 2021-05-27 DIAGNOSIS — Z7901 Long term (current) use of anticoagulants: Secondary | ICD-10-CM | POA: Diagnosis not present

## 2021-05-27 DIAGNOSIS — M7989 Other specified soft tissue disorders: Secondary | ICD-10-CM | POA: Diagnosis not present

## 2021-05-27 NOTE — Progress Notes (Signed)
Hematology/Oncology Consult note [facility] Telephone:([phone] [phone]   Patient Care Team: Marinda Elk, MD as PCP - General (Physician Assistant)  REFERRING PROVIDER: Marinda Elk, MD  CHIEF COMPLAINTS/REASON FOR VISIT:  Follow up for pulmonary  embolism  HISTORY OF PRESENTING ILLNESS:   Dana Bautista is a  75 y.o.  female with PMH listed below was seen in consultation at the request of  Marinda Elk, MD  for evaluation of pulmonary embolism  Patient was admitted from 10/04/2019-10/05/2019 due to acute bilateral pulmonary embolism and DVT.  Patient denies any immobilization factors prior to the events.  Her initial symptom was sudden onset of left-sided chest pain. 10/04/2019, CT chest angiogram showed acute bilateral lower lobe pulmonary embolism.  No right heart strain.  Small left pleural effusion. 10/05/2019 bilateral lower extremity ultrasound showed right calf posterior calf occlusive DVT.  Very low thrombus burden.  Patient was started on anticoagulation and discharged on Eliquis. She has been on anticoagulation for slightly more than 6 months. She was referred to hematology for evaluation management. Patient reports that the left chest pain has completely resolved.  She was less active since the diagnosis of pulmonary embolism and has been deconditioned.  She has tried to exercise more and her exercise endurance has improved. Intermittently she experienced left lower extremity swelling. She denies any constitutional symptoms.  July 2021 -switched to Eliquis 2.'5mg'$  BID  INTERVAL HISTORY Dana Bautista is a 75 y.o. female who has above history reviewed by me today presents for follow up visit for management of history of thrombosis.  Problems and complaints are listed below: She has been onb Eliquis 2.'5mg'$  BID. She tolerates well.  She has had experienced right outer thigh sharp pain and also some numbness and tingling shoot down from  her right lowe back. She currently participate in physical therapy.   No bleeding events.  Review of Systems  Constitutional:  Negative for appetite change, chills, fatigue and fever.  HENT:   Negative for hearing loss and voice change.   Eyes:  Negative for eye problems.  Respiratory:  Negative for chest tightness and cough.   Cardiovascular:  Negative for chest pain.  Gastrointestinal:  Negative for abdominal distention, abdominal pain and blood in stool.  Endocrine: Negative for hot flashes.  Genitourinary:  Negative for difficulty urinating and frequency.   Musculoskeletal:  Negative for arthralgias.  Skin:  Negative for itching and rash.  Neurological:  Negative for extremity weakness.  Hematological:  Negative for adenopathy.  Psychiatric/Behavioral:  Negative for confusion.    MEDICAL HISTORY:  Past Medical History:  Diagnosis Date   Arthritis    osteoarthriist   Cancer ([place])    skin   Complication of anesthesia    nausea   DVT (deep venous thrombosis) ([place]) 09/2019   Heart murmur    Mitral valve regurgitation    Pulmonary embolism ([facility])    Tendonitis    outer aspect of right foot   UTI (lower urinary tract infection)     SURGICAL HISTORY: Past Surgical History:  Procedure Laterality Date   ABDOMINAL HYSTERECTOMY     APPENDECTOMY     FRACTURE SURGERY     left wrist   FRACTURE SURGERY Left    wrist   TONSILLECTOMY      SOCIAL HISTORY: Social History   Socioeconomic History   Marital status: Married    Spouse name: Not on file   Number of children: Not on file   Years  of education: Not on file   Highest education level: Not on file  Occupational History   Not on file  Tobacco Use   Smoking status: Never   Smokeless tobacco: Never  Vaping Use   Vaping Use: Never used  Substance and Sexual Activity   Alcohol use: No   Drug use: No   Sexual activity: Not on file  Other Topics Concern   Not on file  Social History Narrative   Not on file    Social Determinants of Health   Financial Resource Strain: Not on file  Food Insecurity: Not on file  Transportation Needs: Not on file  Physical Activity: Not on file  Stress: Not on file  Social Connections: Not on file  Intimate Partner Violence: Not on file    FAMILY HISTORY: Family History  Problem Relation Age of Onset   Breast cancer Mother    Asthma Mother    Lung cancer Father    Heart disease Brother    Heart disease Maternal Grandfather    Throat cancer Maternal Grandfather     ALLERGIES:  is allergic to codeine, elemental sulfur, levaquin [levofloxacin], penicillins, sulfa antibiotics, and latex.  MEDICATIONS:  Current Outpatient Medications  Medication Sig Dispense Refill   apixaban (ELIQUIS) 2.5 MG TABS tablet Take 1 tablet (2.5 mg total) by mouth 2 (two) times daily. 180 tablet 1   ascorbic acid (VITAMIN C) 500 MG tablet Take 500 mg by mouth daily.     cyanocobalamin (,VITAMIN B-12,) 1000 MCG/ML injection Inject 1,000 mcg into the muscle every 30 (thirty) days.     diclofenac Sodium (VOLTAREN) 1 % GEL      calcium carbonate (OSCAL) 1500 (600 Ca) MG TABS tablet Take 600 mg of elemental calcium by mouth 2 (two) times daily with a meal. (Patient not taking: Reported on 05/27/2021)     cetirizine (ZYRTEC) 10 MG tablet  (Patient not taking: Reported on 05/27/2021)     clindamycin (CLEOCIN) 150 MG capsule  (Patient not taking: Reported on 05/27/2021)     fluticasone (FLONASE) 50 MCG/ACT nasal spray  (Patient not taking: Reported on 05/27/2021)     HYDROcodone-acetaminophen (NORCO/VICODIN) 5-325 MG tablet Take 1 tablet by mouth every 6 (six) hours as needed. (Patient not taking: No sig reported) 10 tablet 0   lubiprostone (AMITIZA) 24 MCG capsule Take 24 mcg by mouth daily as needed for constipation.  (Patient not taking: Reported on 05/27/2021)     MAGNESIUM-POTASSIUM PO Take 1 tablet by mouth daily. (Patient not taking: No sig reported)     ondansetron (ZOFRAN ODT) 4 MG  disintegrating tablet Take 1 tablet (4 mg total) by mouth every 8 (eight) hours as needed for nausea or vomiting. (Patient not taking: No sig reported) 20 tablet 0   triamcinolone cream (KENALOG) 0.1 % Apply 1 application topically 2 (two) times daily. (Patient not taking: No sig reported) 30 g 0   VITAMIN D, CHOLECALCIFEROL, PO Take by mouth. (Patient not taking: No sig reported)     Vitamin D, Ergocalciferol, (DRISDOL) 50000 units CAPS capsule Take 50,000 Units by mouth every 7 (seven) days. (Patient not taking: No sig reported)     No current facility-administered medications for this visit.     PHYSICAL EXAMINATION: ECOG PERFORMANCE STATUS: 0 - Asymptomatic Vitals:   05/27/21 1450  BP: (!) 121/48  Pulse: 66  Resp: 18  Temp: (!) 97.4 F (36.3 C)  SpO2: 98%   Filed Weights   05/27/21 1450  Weight: 209  lb 10.5 oz (95.1 kg)    Physical Exam Constitutional:      General: She is not in acute distress. HENT:     Head: Normocephalic and atraumatic.  Eyes:     General: No scleral icterus. Cardiovascular:     Rate and Rhythm: Normal rate and regular rhythm.     Heart sounds: Normal heart sounds.  Pulmonary:     Effort: Pulmonary effort is normal. No respiratory distress.     Breath sounds: No wheezing.  Abdominal:     General: Bowel sounds are normal. There is no distension.     Palpations: Abdomen is soft.  Musculoskeletal:        General: No deformity. Normal range of motion.     Cervical back: Normal range of motion and neck supple.     Comments: Trace edema, bilateral lower extremities.  varicose vein bilaterally.   Skin:    General: Skin is warm and dry.     Findings: No erythema or rash.  Neurological:     Mental Status: She is alert and oriented to person, place, and time. Mental status is at baseline.     Cranial Nerves: No cranial nerve deficit.     Coordination: Coordination normal.  Psychiatric:        Mood and Affect: Mood normal.    LABORATORY DATA:  I  have reviewed the data as listed Lab Results  Component Value Date   WBC 6.3 05/25/2021   HGB 11.4 (L) 05/25/2021   HCT 34.7 (L) 05/25/2021   MCV 84.4 05/25/2021   PLT 281 05/25/2021   Recent Labs    10/20/20 1247 05/25/21 0923  NA 138 134*  K 4.4 4.4  CL 103 102  CO2 25 26  GLUCOSE 98 101*  BUN 15 17  CREATININE 0.58 0.74  CALCIUM 9.4 9.3  GFRNONAA >60 >60  PROT 7.3 7.3  ALBUMIN 4.1 4.2  AST 14* 15  ALT 9 10  ALKPHOS 46 61  BILITOT 1.0 0.8    Iron/TIBC/Ferritin/ %Sat    Component Value Date/Time   IRON 59 05/25/2021 0923   TIBC 360 05/25/2021 0923   FERRITIN 21 05/25/2021 0923   IRONPCTSAT 16 05/25/2021 0923    hypercoagulable state work up.  Negative prothrombin gene mutation, Factor V leiden mutation.  Negative anticardiolipin IgM, indeterminate level of anticardiolipin IgG, not meeting antiphospholipid syndrome diagnosis criteria.   RADIOGRAPHIC STUDIES: I have personally reviewed the radiological images as listed and agreed with the findings in the report. No results found.    ASSESSMENT & PLAN:  1. History of pulmonary embolism   2. History of deep vein thrombosis (DVT) of lower extremity   3. Right thigh pain    #History of pulmonary embolism and right lower extremity DVT Labs are reviewed and discussed with patient. Continue Eliquis 2.'5mg'$  BID.   Intermittent lower extremity swelling, likely vein insufficiency.   leg elevation, compression stocking.   Right outer thigh sharp pain, numbness/tingling ?  meralgia paresthetica. I advise patient to further discuss with PCP and physical therapist.   Orders Placed This Encounter  Procedures   Comprehensive metabolic panel    Standing Status:   Future    Standing Expiration Date:   05/27/2022   CBC with Differential/Platelet    Standing Status:   Future    Standing Expiration Date:   05/27/2022     All questions were answered. The patient knows to call the clinic with any problems questions or  concerns.  cc Marinda Elk, MD    Return of visit: 6 months.  Earlie Server, MD, PhD Hematology Oncology [facility] at [facility] Pager- [phone] 05/27/2021

## 2021-06-03 ENCOUNTER — Other Ambulatory Visit: Payer: Self-pay

## 2021-06-03 ENCOUNTER — Inpatient Hospital Stay
Admission: EM | Admit: 2021-06-03 | Discharge: 2021-06-06 | DRG: 988 | Disposition: A | Discharge status: Home / Self Care | Payer: Medicare Other | Attending: Internal Medicine | Admitting: Internal Medicine

## 2021-06-03 ENCOUNTER — Observation Stay: Payer: Medicare Other | Admitting: Anesthesiology

## 2021-06-03 ENCOUNTER — Emergency Department: Payer: Medicare Other

## 2021-06-03 ENCOUNTER — Encounter
Admission: EM | Disposition: A | Discharge status: Home / Self Care | Payer: Self-pay | Source: Home / Self Care | Attending: Internal Medicine

## 2021-06-03 DIAGNOSIS — M659 Synovitis and tenosynovitis, unspecified: Secondary | ICD-10-CM

## 2021-06-03 DIAGNOSIS — Z8249 Family history of ischemic heart disease and other diseases of the circulatory system: Secondary | ICD-10-CM

## 2021-06-03 DIAGNOSIS — Z808 Family history of malignant neoplasm of other organs or systems: Secondary | ICD-10-CM

## 2021-06-03 DIAGNOSIS — Z7901 Long term (current) use of anticoagulants: Secondary | ICD-10-CM

## 2021-06-03 DIAGNOSIS — S61451A Open bite of right hand, initial encounter: Secondary | ICD-10-CM | POA: Diagnosis not present

## 2021-06-03 DIAGNOSIS — M1990 Unspecified osteoarthritis, unspecified site: Secondary | ICD-10-CM | POA: Diagnosis present

## 2021-06-03 DIAGNOSIS — M65141 Other infective (teno)synovitis, right hand: Secondary | ICD-10-CM | POA: Insufficient documentation

## 2021-06-03 DIAGNOSIS — A280 Pasteurellosis: Secondary | ICD-10-CM | POA: Diagnosis present

## 2021-06-03 DIAGNOSIS — W540XXA Bitten by dog, initial encounter: Secondary | ICD-10-CM | POA: Diagnosis not present

## 2021-06-03 DIAGNOSIS — Z880 Allergy status to penicillin: Secondary | ICD-10-CM

## 2021-06-03 DIAGNOSIS — Z86718 Personal history of other venous thrombosis and embolism: Secondary | ICD-10-CM

## 2021-06-03 DIAGNOSIS — Z23 Encounter for immunization: Secondary | ICD-10-CM

## 2021-06-03 DIAGNOSIS — M65841 Other synovitis and tenosynovitis, right hand: Secondary | ICD-10-CM | POA: Diagnosis present

## 2021-06-03 DIAGNOSIS — L0390 Cellulitis, unspecified: Secondary | ICD-10-CM | POA: Diagnosis present

## 2021-06-03 DIAGNOSIS — Z882 Allergy status to sulfonamides status: Secondary | ICD-10-CM

## 2021-06-03 DIAGNOSIS — Z825 Family history of asthma and other chronic lower respiratory diseases: Secondary | ICD-10-CM

## 2021-06-03 DIAGNOSIS — Z79899 Other long term (current) drug therapy: Secondary | ICD-10-CM

## 2021-06-03 DIAGNOSIS — Z803 Family history of malignant neoplasm of breast: Secondary | ICD-10-CM

## 2021-06-03 DIAGNOSIS — Z801 Family history of malignant neoplasm of trachea, bronchus and lung: Secondary | ICD-10-CM

## 2021-06-03 DIAGNOSIS — Z85828 Personal history of other malignant neoplasm of skin: Secondary | ICD-10-CM

## 2021-06-03 DIAGNOSIS — Z91040 Latex allergy status: Secondary | ICD-10-CM

## 2021-06-03 DIAGNOSIS — Z885 Allergy status to narcotic agent status: Secondary | ICD-10-CM

## 2021-06-03 DIAGNOSIS — D649 Anemia, unspecified: Secondary | ICD-10-CM | POA: Diagnosis present

## 2021-06-03 DIAGNOSIS — L03113 Cellulitis of right upper limb: Secondary | ICD-10-CM | POA: Diagnosis not present

## 2021-06-03 DIAGNOSIS — Z86711 Personal history of pulmonary embolism: Secondary | ICD-10-CM

## 2021-06-03 DIAGNOSIS — Z20822 Contact with and (suspected) exposure to covid-19: Secondary | ICD-10-CM | POA: Diagnosis present

## 2021-06-03 DIAGNOSIS — R112 Nausea with vomiting, unspecified: Secondary | ICD-10-CM | POA: Diagnosis not present

## 2021-06-03 DIAGNOSIS — S61250A Open bite of right index finger without damage to nail, initial encounter: Secondary | ICD-10-CM | POA: Diagnosis present

## 2021-06-03 DIAGNOSIS — E871 Hypo-osmolality and hyponatremia: Secondary | ICD-10-CM | POA: Diagnosis present

## 2021-06-03 DIAGNOSIS — Z888 Allergy status to other drugs, medicaments and biological substances status: Secondary | ICD-10-CM

## 2021-06-03 HISTORY — PX: INCISION AND DRAINAGE: SHX5863

## 2021-06-03 LAB — CBC WITH DIFFERENTIAL/PLATELET
Abs Immature Granulocytes: 0.03 10*3/uL (ref 0.00–0.07)
Basophils Absolute: 0 10*3/uL (ref 0.0–0.1)
Basophils Relative: 0 %
Eosinophils Absolute: 0.1 10*3/uL (ref 0.0–0.5)
Eosinophils Relative: 1 %
HCT: 36.7 % (ref 36.0–46.0)
Hemoglobin: 11.9 g/dL — ABNORMAL LOW (ref 12.0–15.0)
Immature Granulocytes: 0 %
Lymphocytes Relative: 24 %
Lymphs Abs: 2.3 10*3/uL (ref 0.7–4.0)
MCH: 28 pg (ref 26.0–34.0)
MCHC: 32.4 g/dL (ref 30.0–36.0)
MCV: 86.4 fL (ref 80.0–100.0)
Monocytes Absolute: 0.8 10*3/uL (ref 0.1–1.0)
Monocytes Relative: 8 %
Neutro Abs: 6.4 10*3/uL (ref 1.7–7.7)
Neutrophils Relative %: 67 %
Platelets: 260 10*3/uL (ref 150–400)
RBC: 4.25 MIL/uL (ref 3.87–5.11)
RDW: 13.3 % (ref 11.5–15.5)
WBC: 9.5 10*3/uL (ref 4.0–10.5)
nRBC: 0 % (ref 0.0–0.2)

## 2021-06-03 LAB — BASIC METABOLIC PANEL
Anion gap: 6 (ref 5–15)
BUN: 10 mg/dL (ref 8–23)
CO2: 27 mmol/L (ref 22–32)
Calcium: 9.2 mg/dL (ref 8.9–10.3)
Chloride: 102 mmol/L (ref 98–111)
Creatinine, Ser: 0.55 mg/dL (ref 0.44–1.00)
GFR, Estimated: >60 mL/min (ref >60)
Glucose, Bld: 104 mg/dL — ABNORMAL HIGH (ref 70–99)
Potassium: 4 mmol/L (ref 3.5–5.1)
Sodium: 135 mmol/L (ref 135–145)

## 2021-06-03 LAB — RESP PANEL BY RT-PCR (FLU A&B, COVID) ARPGX2
Influenza A by PCR: NEGATIVE
Influenza B by PCR: NEGATIVE
SARS Coronavirus 2 by RT PCR: NEGATIVE

## 2021-06-03 LAB — SEDIMENTATION RATE: Sed Rate: 27 mm/hr (ref 0–30)

## 2021-06-03 LAB — C-REACTIVE PROTEIN: CRP: 2.2 mg/dL — ABNORMAL HIGH (ref <1.0)

## 2021-06-03 SURGERY — INCISION AND DRAINAGE
Anesthesia: General | Laterality: Right

## 2021-06-03 MED ORDER — OXYCODONE HCL 5 MG PO TABS: 5.0000 mg | ORAL_TABLET | ORAL | Status: DC | PRN

## 2021-06-03 MED ORDER — OXYCODONE-ACETAMINOPHEN 5-325 MG PO TABS: 1.0000 | ORAL_TABLET | ORAL | Status: DC | PRN

## 2021-06-03 MED ORDER — DEXAMETHASONE SODIUM PHOSPHATE 10 MG/ML IJ SOLN
INTRAMUSCULAR | Status: DC | PRN
  Administered 2021-06-03: 10 mg via INTRAVENOUS

## 2021-06-03 MED ORDER — HYDROMORPHONE HCL 1 MG/ML IJ SOLN
0.2500 mg | INTRAMUSCULAR | Status: DC | PRN
Start: 2021-06-03 — End: 2021-06-06
  Filled 2021-06-03: qty 1

## 2021-06-03 MED ORDER — DOXYCYCLINE HYCLATE 100 MG PO TABS
100.0000 mg | ORAL_TABLET | Freq: Once | ORAL | Status: AC
  Administered 2021-06-03: 100 mg via ORAL
  Filled 2021-06-03: qty 1

## 2021-06-03 MED ORDER — DOCUSATE SODIUM 100 MG PO CAPS
100.0000 mg | ORAL_CAPSULE | Freq: Two times a day (BID) | ORAL | Status: DC
  Administered 2021-06-03 – 2021-06-06 (×6): 100 mg via ORAL
  Filled 2021-06-03 (×6): qty 1

## 2021-06-03 MED ORDER — ONDANSETRON HCL 4 MG/2ML IJ SOLN
4.0000 mg | Freq: Once | INTRAMUSCULAR | Status: AC
  Administered 2021-06-03: 4 mg via INTRAVENOUS
  Filled 2021-06-03: qty 2

## 2021-06-03 MED ORDER — FENTANYL CITRATE (PF) 100 MCG/2ML IJ SOLN
INTRAMUSCULAR | Status: AC
  Filled 2021-06-03: qty 2

## 2021-06-03 MED ORDER — PROPOFOL 500 MG/50ML IV EMUL
INTRAVENOUS | Status: DC | PRN
Start: 2021-06-03 — End: 2021-06-03
  Administered 2021-06-03: 100 ug/kg/min via INTRAVENOUS

## 2021-06-03 MED ORDER — ONDANSETRON HCL 4 MG/2ML IJ SOLN
4.0000 mg | Freq: Three times a day (TID) | INTRAMUSCULAR | Status: DC | PRN
  Filled 2021-06-03 (×2): qty 2

## 2021-06-03 MED ORDER — VANCOMYCIN HCL 2000 MG/400ML IV SOLN
2000.0000 mg | Freq: Once | INTRAVENOUS | Status: AC
  Administered 2021-06-03: 2000 mg via INTRAVENOUS
  Filled 2021-06-03: qty 400

## 2021-06-03 MED ORDER — ONDANSETRON HCL 4 MG/2ML IJ SOLN
INTRAMUSCULAR | Status: DC | PRN
Start: 2021-06-03 — End: 2021-06-03
  Administered 2021-06-03: 4 mg via INTRAVENOUS

## 2021-06-03 MED ORDER — KETOROLAC TROMETHAMINE 15 MG/ML IJ SOLN
7.5000 mg | Freq: Four times a day (QID) | INTRAMUSCULAR | Status: AC
  Administered 2021-06-04 (×4): 7.5 mg via INTRAVENOUS
  Filled 2021-06-03 (×4): qty 1

## 2021-06-03 MED ORDER — KETOROLAC TROMETHAMINE 15 MG/ML IJ SOLN
INTRAMUSCULAR | Status: AC
  Filled 2021-06-03: qty 1

## 2021-06-03 MED ORDER — FENTANYL CITRATE (PF) 100 MCG/2ML IJ SOLN
INTRAMUSCULAR | Status: AC
  Administered 2021-06-03: 50 ug via INTRAVENOUS
  Filled 2021-06-03: qty 2

## 2021-06-03 MED ORDER — HEPARIN (PORCINE) 25000 UT/250ML-% IV SOLN: 1300.0000 [IU]/h | INTRAVENOUS | Status: DC

## 2021-06-03 MED ORDER — VANCOMYCIN HCL 1000 MG IV SOLR
INTRAVENOUS | Status: DC | PRN
  Administered 2021-06-03: 2000 mg via INTRAVENOUS

## 2021-06-03 MED ORDER — APIXABAN 2.5 MG PO TABS
2.5000 mg | ORAL_TABLET | Freq: Two times a day (BID) | ORAL | Status: DC
  Administered 2021-06-04 – 2021-06-06 (×5): 2.5 mg via ORAL
  Filled 2021-06-03 (×5): qty 1

## 2021-06-03 MED ORDER — PROPOFOL 10 MG/ML IV BOLUS
INTRAVENOUS | Status: AC
  Filled 2021-06-03: qty 20

## 2021-06-03 MED ORDER — ONDANSETRON HCL 4 MG PO TABS
4.0000 mg | ORAL_TABLET | Freq: Four times a day (QID) | ORAL | Status: DC | PRN
  Filled 2021-06-03 (×2): qty 1

## 2021-06-03 MED ORDER — LACTATED RINGERS IV SOLN: INTRAVENOUS | Status: DC | PRN

## 2021-06-03 MED ORDER — METOCLOPRAMIDE HCL 5 MG/ML IJ SOLN
5.0000 mg | Freq: Three times a day (TID) | INTRAMUSCULAR | Status: DC | PRN
  Administered 2021-06-04 – 2021-06-05 (×3): 10 mg via INTRAVENOUS
  Filled 2021-06-03 (×3): qty 2

## 2021-06-03 MED ORDER — PROPOFOL 10 MG/ML IV BOLUS
INTRAVENOUS | Status: DC | PRN
  Administered 2021-06-03: 200 mg via INTRAVENOUS

## 2021-06-03 MED ORDER — DIPHENHYDRAMINE HCL 12.5 MG/5ML PO ELIX: 12.5000 mg | ORAL_SOLUTION | ORAL | Status: DC | PRN

## 2021-06-03 MED ORDER — HEPARIN (PORCINE) 25000 UT/250ML-% IV SOLN
1300.0000 [IU]/h | INTRAVENOUS | Status: DC
  Filled 2021-06-03: qty 250

## 2021-06-03 MED ORDER — MORPHINE SULFATE (PF) 2 MG/ML IV SOLN: 1.0000 mg | INTRAVENOUS | Status: DC | PRN

## 2021-06-03 MED ORDER — ACETAMINOPHEN 325 MG PO TABS
325.0000 mg | ORAL_TABLET | Freq: Four times a day (QID) | ORAL | Status: DC | PRN
  Administered 2021-06-06: 650 mg via ORAL
  Filled 2021-06-03: qty 2

## 2021-06-03 MED ORDER — SODIUM CHLORIDE 0.9 % IV SOLN: Freq: Once | INTRAVENOUS | Status: DC

## 2021-06-03 MED ORDER — CLINDAMYCIN PHOSPHATE 300 MG/50ML IV SOLN
300.0000 mg | Freq: Once | INTRAVENOUS | Status: AC
  Administered 2021-06-03: 300 mg via INTRAVENOUS
  Filled 2021-06-03: qty 50

## 2021-06-03 MED ORDER — TETANUS-DIPHTH-ACELL PERTUSSIS 5-2.5-18.5 LF-MCG/0.5 IM SUSY
0.5000 mL | PREFILLED_SYRINGE | Freq: Once | INTRAMUSCULAR | Status: AC
  Administered 2021-06-03: 0.5 mL via INTRAMUSCULAR
  Filled 2021-06-03: qty 0.5

## 2021-06-03 MED ORDER — SODIUM CHLORIDE 0.9 % IV SOLN: Freq: Once | INTRAVENOUS | Status: AC

## 2021-06-03 MED ORDER — MAGNESIUM HYDROXIDE 400 MG/5ML PO SUSP: 30.0000 mL | Freq: Every day | ORAL | Status: DC | PRN

## 2021-06-03 MED ORDER — ACETAMINOPHEN 325 MG PO TABS
650.0000 mg | ORAL_TABLET | Freq: Four times a day (QID) | ORAL | Status: DC | PRN
Start: 2021-06-03 — End: 2021-06-03

## 2021-06-03 MED ORDER — SODIUM CHLORIDE 0.9 % IV SOLN
100.0000 mg | Freq: Two times a day (BID) | INTRAVENOUS | Status: DC
  Administered 2021-06-04 (×3): 100 mg via INTRAVENOUS
  Filled 2021-06-03 (×6): qty 100

## 2021-06-03 MED ORDER — KETOROLAC TROMETHAMINE 15 MG/ML IJ SOLN
15.0000 mg | Freq: Once | INTRAMUSCULAR | Status: AC
  Administered 2021-06-03: 15 mg via INTRAVENOUS

## 2021-06-03 MED ORDER — ONDANSETRON HCL 4 MG/2ML IJ SOLN
4.0000 mg | Freq: Four times a day (QID) | INTRAMUSCULAR | Status: DC | PRN
  Administered 2021-06-04 – 2021-06-06 (×5): 4 mg via INTRAVENOUS
  Filled 2021-06-03 (×4): qty 2

## 2021-06-03 MED ORDER — FENTANYL CITRATE (PF) 100 MCG/2ML IJ SOLN
25.0000 ug | INTRAMUSCULAR | Status: DC | PRN
  Administered 2021-06-03: 50 ug via INTRAVENOUS

## 2021-06-03 MED ORDER — TRAMADOL HCL 50 MG PO TABS
50.0000 mg | ORAL_TABLET | Freq: Four times a day (QID) | ORAL | Status: DC
  Administered 2021-06-03 – 2021-06-04 (×6): 50 mg via ORAL
  Filled 2021-06-03 (×6): qty 1

## 2021-06-03 MED ORDER — DEXMEDETOMIDINE (PRECEDEX) IN NS 20 MCG/5ML (4 MCG/ML) IV SYRINGE
PREFILLED_SYRINGE | INTRAVENOUS | Status: DC | PRN
  Administered 2021-06-03: 6 ug via INTRAVENOUS
  Administered 2021-06-03: 4 ug via INTRAVENOUS

## 2021-06-03 MED ORDER — MIDAZOLAM HCL 2 MG/2ML IJ SOLN
INTRAMUSCULAR | Status: AC
  Filled 2021-06-03: qty 2

## 2021-06-03 MED ORDER — FLEET ENEMA 7-19 GM/118ML RE ENEM: 1.0000 | ENEMA | Freq: Once | RECTAL | Status: DC | PRN

## 2021-06-03 MED ORDER — BISACODYL 10 MG RE SUPP: 10.0000 mg | Freq: Every day | RECTAL | Status: DC | PRN

## 2021-06-03 MED ORDER — PROPOFOL 10 MG/ML IV BOLUS
INTRAVENOUS | Status: AC
  Filled 2021-06-03: qty 40

## 2021-06-03 MED ORDER — MORPHINE SULFATE (PF) 4 MG/ML IV SOLN
4.0000 mg | INTRAVENOUS | Status: DC | PRN
  Administered 2021-06-03: 4 mg via INTRAVENOUS
  Filled 2021-06-03: qty 1

## 2021-06-03 MED ORDER — SODIUM CHLORIDE 0.9 % IV SOLN: INTRAVENOUS | Status: DC

## 2021-06-03 MED ORDER — ACETAMINOPHEN 500 MG PO TABS
1000.0000 mg | ORAL_TABLET | Freq: Four times a day (QID) | ORAL | Status: AC
  Administered 2021-06-03 – 2021-06-04 (×4): 1000 mg via ORAL
  Filled 2021-06-03 (×4): qty 2

## 2021-06-03 MED ORDER — FENTANYL CITRATE (PF) 100 MCG/2ML IJ SOLN
INTRAMUSCULAR | Status: DC | PRN
  Administered 2021-06-03 (×2): 25 ug via INTRAVENOUS

## 2021-06-03 MED ORDER — LIDOCAINE HCL (CARDIAC) PF 100 MG/5ML IV SOSY
PREFILLED_SYRINGE | INTRAVENOUS | Status: DC | PRN
  Administered 2021-06-03: 100 mg via INTRAVENOUS

## 2021-06-03 MED ORDER — 0.9 % SODIUM CHLORIDE (POUR BTL) OPTIME
TOPICAL | Status: DC | PRN
  Administered 2021-06-03: 500 mL

## 2021-06-03 MED ORDER — METRONIDAZOLE 500 MG/100ML IV SOLN
500.0000 mg | Freq: Two times a day (BID) | INTRAVENOUS | Status: DC
  Administered 2021-06-03 – 2021-06-04 (×3): 500 mg via INTRAVENOUS
  Filled 2021-06-03 (×6): qty 100

## 2021-06-03 MED ORDER — LIDOCAINE HCL (PF) 2 % IJ SOLN
INTRAMUSCULAR | Status: AC
  Filled 2021-06-03: qty 5

## 2021-06-03 MED ORDER — MIDAZOLAM HCL 2 MG/2ML IJ SOLN
INTRAMUSCULAR | Status: DC | PRN
  Administered 2021-06-03: 2 mg via INTRAVENOUS

## 2021-06-03 MED ORDER — METOCLOPRAMIDE HCL 10 MG PO TABS: 5.0000 mg | ORAL_TABLET | Freq: Three times a day (TID) | ORAL | Status: DC | PRN

## 2021-06-03 SURGICAL SUPPLY — 33 items
BNDG CONFORM 2 STRL LF (GAUZE/BANDAGES/DRESSINGS) ×2 IMPLANT
BNDG ELASTIC 2X5.8 VLCR NS LF (GAUZE/BANDAGES/DRESSINGS) ×2 IMPLANT
BNDG ESMARK 4X12 TAN STRL LF (GAUZE/BANDAGES/DRESSINGS) IMPLANT
CATH FOLEY SIL 2WAY 12FR5CC (CATHETERS) ×2 IMPLANT
CHLORAPREP W/TINT 26 (MISCELLANEOUS) ×2 IMPLANT
CORD BIP STRL DISP 12FT (MISCELLANEOUS) ×2 IMPLANT
CUFF TOURN SGL QUICK 18X4 (TOURNIQUET CUFF) IMPLANT
CUFF TOURN SGL QUICK 24 (TOURNIQUET CUFF)
CUFF TRNQT CYL 24X4X16.5-23 (TOURNIQUET CUFF) IMPLANT
DRAIN PENROSE 12X.25 LTX STRL (MISCELLANEOUS) IMPLANT
FORCEPS JEWEL BIP 4-3/4 STR (INSTRUMENTS) ×2 IMPLANT
GAUZE 4X4 16PLY ~~LOC~~+RFID DBL (SPONGE) ×2 IMPLANT
GAUZE SPONGE 4X4 12PLY STRL (GAUZE/BANDAGES/DRESSINGS) ×2 IMPLANT
GAUZE XEROFORM 1X8 LF (GAUZE/BANDAGES/DRESSINGS) IMPLANT
GLOVE SURG ENC MOIS LTX SZ8 (GLOVE) ×4 IMPLANT
GLOVE SURG UNDER LTX SZ8 (GLOVE) ×2 IMPLANT
GOWN STRL REUS W/ TWL LRG LVL3 (GOWN DISPOSABLE) ×1 IMPLANT
GOWN STRL REUS W/ TWL XL LVL3 (GOWN DISPOSABLE) ×1 IMPLANT
GOWN STRL REUS W/TWL LRG LVL3 (GOWN DISPOSABLE) ×1
GOWN STRL REUS W/TWL XL LVL3 (GOWN DISPOSABLE) ×1
IV CATH 18G SAFETY (IV SOLUTION) ×2 IMPLANT
IV CATH ANGIO 14GX1.88 NO SAFE (IV SOLUTION) ×2 IMPLANT
KIT TURNOVER KIT A (KITS) ×2 IMPLANT
MANIFOLD NEPTUNE II (INSTRUMENTS) ×2 IMPLANT
NS IRRIG 500ML POUR BTL (IV SOLUTION) ×2 IMPLANT
PACK EXTREMITY ARMC (MISCELLANEOUS) ×2 IMPLANT
PAD CAST CTTN 4X4 STRL (SOFTGOODS) IMPLANT
PADDING CAST COTTON 4X4 STRL (SOFTGOODS)
STOCKINETTE STRL 4IN 9604848 (GAUZE/BANDAGES/DRESSINGS) ×2 IMPLANT
STRAP SAFETY 5IN WIDE (MISCELLANEOUS) IMPLANT
SUT PROLENE 4 0 PS 2 18 (SUTURE) ×2 IMPLANT
SWAB CULTURE AMIES ANAERIB BLU (MISCELLANEOUS) ×2 IMPLANT
WATER STERILE IRR 500ML POUR (IV SOLUTION) IMPLANT

## 2021-06-03 NOTE — Progress Notes (Signed)
PHARMACY -  BRIEF ANTIBIOTIC NOTE   Pharmacy has received consult(s) for cellulitis from an ED provider.  The patient's profile has been reviewed for ht/wt/allergies/indication/available labs.    One time order(s) placed for vancomycin 2,000 mg loading dose x 1  Further antibiotics/pharmacy consults should be ordered by admitting physician if indicated.                       Thank you, Wynelle Cleveland, PharmD Pharmacy Resident  06/03/2021 11:00 AM

## 2021-06-03 NOTE — Transfer of Care (Signed)
Immediate Anesthesia Transfer of Care Note  Patient: Dana Bautista  Procedure(s) Performed: INCISION AND DRAINAGE-Right Index Finger (Right)  Patient Location: PACU  Anesthesia Type:General  Level of Consciousness: drowsy  Airway & Oxygen Therapy: Patient Spontanous Breathing and Patient connected to face mask oxygen  Post-op Assessment: Report given to RN and Post -op Vital signs reviewed and stable  Post vital signs: Reviewed and stable  Last Vitals:  Vitals Value Taken Time  BP 90/45 06/03/21 1632  Temp    Pulse 69 06/03/21 1635  Resp 21 06/03/21 1635  SpO2 100 % 06/03/21 1635  Vitals shown include unvalidated device data.  Last Pain:  Vitals:   06/03/21 1336  TempSrc:   PainSc: 7          Complications: No notable events documented.

## 2021-06-03 NOTE — ED Notes (Signed)
See triage note  presents from [facility] s/p dog bite   bite noted to index finger  states she has been keeping it clean  but this am hand and finger are swollen and tender

## 2021-06-03 NOTE — Consult Note (Addendum)
ORTHOPAEDIC CONSULTATION  REQUESTING PHYSICIAN: Ivor Costa, MD  Chief Complaint:   Right index finger pain, swelling, and erythema.  History of Present Illness: Dana Bautista is a 76 y.o. 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  The patient was in her usual state of health yesterday when she apparently went to pet her 82 year old dog who was awakening from a nap.  Apparently, the dog became confused and bit her in her index finger.  The patient tried to cleanse the wound with peroxide and apply a topical antibiotic ointment, but noticed worsening pain, swelling, and erythema through the night into this morning, prompting her to present to the emergency room for further evaluation and treatment.  The patient was diagnosed with a suppurative flexor tenosynovitis by the ER provider.  She is being admitted to the hospital service for IV antibiotics.  I have been consulted for consideration of formal irrigation and debridement of the index finger wound.  The patient complains of significant pain to the finger, but denies any fevers or chills.  She also denies any numbness or paresthesias to the fingertip.  Past Medical History:  Diagnosis Date   Arthritis    osteoarthriist   Cancer ([place])    skin   Complication of anesthesia    nausea   DVT (deep venous thrombosis) ([place]) 09/2019   Heart murmur    Mitral valve regurgitation    Pulmonary embolism ([facility])    Tendonitis    outer aspect of right foot   UTI (lower urinary tract infection)    Past Surgical History:  Procedure Laterality Date   ABDOMINAL HYSTERECTOMY     APPENDECTOMY     FRACTURE SURGERY     left wrist   FRACTURE SURGERY Left    wrist   TONSILLECTOMY     Social History   Socioeconomic History   Marital status: Married     Spouse name: Not on file   Number of children: Not on file   Years of education: Not on file   Highest education level: Not on file  Occupational History   Not on file  Tobacco Use   Smoking status: Never   Smokeless tobacco: Never  Vaping Use   Vaping Use: Never used  Substance and Sexual Activity   Alcohol use: No   Drug use: No   Sexual activity: Not on file  Other Topics Concern   Not on file  Social History Narrative   Not on file   Social Determinants of Health   Financial Resource Strain: Not on file  Food Insecurity: Not on file  Transportation Needs: Not on file  Physical Activity: Not on file  Stress: Not on file  Social Connections: Not on file   Family History  Problem Relation Age of Onset   Breast cancer Mother    Asthma Mother    Lung cancer Father    Heart disease Brother    Heart disease Maternal Grandfather    Throat cancer Maternal Grandfather    Allergies  Allergen Reactions   Codeine Nausea And Vomiting   Elemental Sulfur Other (See Comments)    Lip swelling, difficulty breathing   Levaquin [Levofloxacin] Hives   Penicillins Hives and Other (See Comments)    Did it involve swelling of the face/tongue/throat, SOB, or low BP? Unknown Did it involve sudden or severe rash/hives, skin peeling, or any  reaction on the inside of your mouth or nose? Yes Did you need to seek medical attention at a hospital or doctor's office? Yes When did it last happen? many years If all above answers are "NO", may proceed with cephalosporin use.    Sulfa Antibiotics     Other reaction(s): lip swelling   Latex Rash   Prior to Admission medications   Medication Sig Start Date End Date Taking? Authorizing Provider  apixaban (ELIQUIS) 2.5 MG TABS tablet Take 1 tablet (2.5 mg total) by mouth 2 (two) times daily. 10/20/20  Yes Earlie Server, MD  ascorbic acid (VITAMIN C) 500 MG tablet Take 500 mg by mouth daily.    [provider]  calcium carbonate (OSCAL) 1500  (600 Ca) MG TABS tablet Take 600 mg of elemental calcium by mouth 2 (two) times daily with a meal. Patient not taking: Reported on 05/27/2021    [provider]  cetirizine (ZYRTEC) 10 MG tablet  04/01/20   [provider]  clindamycin (CLEOCIN) 150 MG capsule  04/01/20   [provider]  cyanocobalamin (,VITAMIN B-12,) 1000 MCG/ML injection Inject 1,000 mcg into the muscle every 30 (thirty) days.    [provider]  diclofenac Sodium (VOLTAREN) 1 % GEL  03/22/21   [provider]  fluticasone Asencion Islam) 50 MCG/ACT nasal spray  09/17/20   [provider]  HYDROcodone-acetaminophen (NORCO/VICODIN) 5-325 MG tablet Take 1 tablet by mouth every 6 (six) hours as needed. Patient not taking: No sig reported 09/30/19   Chase Picket, MD  lubiprostone (AMITIZA) 24 MCG capsule Take 24 mcg by mouth daily as needed for constipation.  Patient not taking: Reported on 05/27/2021    [provider]  MAGNESIUM-POTASSIUM PO Take 1 tablet by mouth daily. Patient not taking: No sig reported    [provider]  ondansetron (ZOFRAN ODT) 4 MG disintegrating tablet Take 1 tablet (4 mg total) by mouth every 8 (eight) hours as needed for nausea or vomiting. Patient not taking: No sig reported 09/30/19   Chase Picket, MD  triamcinolone cream (KENALOG) 0.1 % Apply 1 application topically 2 (two) times daily. Patient not taking: No sig reported 03/27/20   Faustino Congress, NP  VITAMIN D, CHOLECALCIFEROL, PO Take by mouth. Patient not taking: No sig reported    [provider]  Vitamin D, Ergocalciferol, (DRISDOL) 50000 units CAPS capsule Take 50,000 Units by mouth every 7 (seven) days. Patient not taking: No sig reported    [provider]   DG Finger Index Right  Result Date: 06/03/2021 CLINICAL DATA:  Dog bite EXAM: RIGHT INDEX FINGER 2+V COMPARISON:  None. FINDINGS: No evidence of acute fracture. No dislocation. Degenerative changes  are visualized at the first [facility] joint. Soft tissue swelling of the index finger most pronounced at the level of the PIP joint. No radiopaque foreign body identified within the soft tissues. IMPRESSION: Soft tissue swelling without acute fracture or dislocation. No radiopaque foreign body identified within the soft tissues. Electronically Signed   By: Davina Poke D.O.   On: 06/03/2021 12:02    Positive ROS: All other systems have been reviewed and were otherwise negative with the exception of those mentioned in the HPI and as above.  Physical Exam: General:  Alert, no acute distress Psychiatric:  Patient is competent for consent with normal mood and affect   Cardiovascular:  No pedal edema Respiratory:  No wheezing, non-labored breathing GI:  Abdomen is soft and non-tender Skin:  No  lesions in the area of chief complaint Neurologic:  Sensation intact distally Lymphatic:  No axillary or cervical lymphadenopathy  Orthopedic Exam:  Orthopedic examination is limited to the right hand.  There are 2 punctate wounds over the volar aspect of the index finger at the level of the PIP joint, consistent with penetrating dog bites.  There is significant fusiform swelling of the finger with significant tenderness to palpation along the flexor sheath and erythema extending from the proximal palmar flexion crease to the DIP joint.  The finger is held in a somewhat flexed position.  She has pain with any attempted passive extension of the finger, as well as with any attempted active motion of the finger.  She is neurovascularly intact to the fingertip.  X-rays:  AP and lateral views of the right index finger are available for review and have been reviewed by myself.  These films demonstrate no evidence for fractures, lytic lesions, or significant degenerative changes.  These films are notable for soft tissue swelling along the length of the index finger.  Assessment: Suppurative flexor tenosynovitis right  index finger status post dog bite.  Plan: The treatment options, including both surgical and nonsurgical choices, have been discussed in detail with the patient.  The patient would like to proceed with surgical intervention to include a formal irrigation and debridement of the flexor sheath of the right index finger.  The risks (including bleeding, infection, nerve and/or blood vessel injury, persistent or recurrent pain, loosening or failure of the components, leg length inequality, dislocation, need for further surgery, blood clots, strokes, heart attacks or arrhythmias, pneumonia, etc.) and benefits of the surgical procedure were discussed.  The patient states her understanding and agrees to proceed.  A formal written consent has been obtained.  Thank you for asking me to participate in the care of this most pleasant yet unfortunate woman.  I will be happy to follow her with you.   Pascal Lux, MD  Beeper #:  [id number]  06/03/2021 2:18 PM

## 2021-06-03 NOTE — Anesthesia Procedure Notes (Signed)
Procedure Name: LMA Insertion Date/Time: 06/03/2021 3:27 PM Performed by: Daryel Gerald, RN Pre-anesthesia Checklist: Patient identified, Emergency Drugs available, Suction available and Patient being monitored Patient Re-evaluated:Patient Re-evaluated prior to induction Oxygen Delivery Method: Circle system utilized Preoxygenation: Pre-oxygenation with 100% oxygen Induction Type: IV induction Ventilation: Mask ventilation without difficulty LMA: LMA inserted LMA Size: 3.5 Number of attempts: 1 Placement Confirmation: positive ETCO2 and breath sounds checked- equal and bilateral Tube secured with: Tape Dental Injury: Teeth and Oropharynx as per pre-operative assessment

## 2021-06-03 NOTE — ED Notes (Signed)
Pt moved from bed 54 to 31 while holding for admission, pt is axox4, pt states that her dog bit her yesterday due to being old and awakened and was startled by her, pt states that she has never done anything like this before, but that her hearing and sight are diminishing due to age.

## 2021-06-03 NOTE — Op Note (Signed)
06/03/2021  4:36 PM  Patient:   Dana Bautista  Pre-Op Diagnosis:   Suppurative flexor tenosynovitis right index finger status post dog bite.  Post-Op Diagnosis:   Same  Procedure:   Irrigation and debridement of bite wounds and flexor tendon sheath, right index finger.  Surgeon:   Pascal Lux, MD  Assistant:   Rennis Chris, PA-S  Anesthesia:   General LMA  Findings:   As above.  Complications:   None  Fluids:   500 cc crystalloid  EBL:   1 cc  UOP:   None  TT:   32 minutes at 250 mmHg  Drains:   IV catheters x2  Closure:   4-0 Prolene interrupted sutures  Brief Clinical Note:   The patient is a 75 year old female who sustained a dog bite to her right index finger yesterday afternoon when her 31 year old dog apparently became disoriented upon awakening from a nap.  Initially, she tried to treat the dog bite wounds with peroxide cleansing and topical antibiotics.  However, her symptoms worsened, so she presented to emergency room where she was diagnosed with suppurative flexor tenosynovitis.  Subsequently, she has been admitted for IV antibiotics and debridement of her right index finger wounds.  Procedure:   The patient was brought into the operating room and laid in the supine position.  After adequate general laryngeal mask anesthesia was obtained, the patient's right hand and upper extremity were prepped with ChloraPrep solution before being draped sterilely.  Preoperative antibiotics were administered.  A timeout was performed to verify the appropriate surgical site.  The right hand was held in an elevated position for several minutes before the tourniquet was inflated to 250 mmHg.  Each of the two dog bite wounds near the volar and volo-ulnar aspects of the right index PIP joint were debrided sharply.  Each wound clearly extended down to the flexor sheath and purulent material could be expressed from them prior to.  Cultures were obtained at this point.  Next, an approximately 2  cm incision was made obliquely along the palmar crease centered over the flexor tendons of the index metacarpal head.  The incision was carried down through the subcutaneous tissues to expose the flexor sheath.  The proximal end of the A1 pulley was identified and the flexor sheath opened at this point.  Again, purulent material could be visualized and expressed.  The A1 pulley was released under direct visualization.  The underlying tendons were inspected and found to be intact.  Distally, a 1 cm incision was made along the mid axial line of the ulnar aspect of the finger at the level of the DIP joint.  This incision was carried down through the subcutaneous tissues to access and enter the flexor sheath in this area.  A 14-gauge Angiocath was placed into the flexor sheath proximally and used to irrigate the flexor sheath thoroughly using sterile saline solution.  Much of the fluid exited the two dog bite wounds at the level of the PIP joint, although some fluid tracked up to the distal incision.  A 16-gauge Angiocath was placed into the volar PIP wound and directed distally to help irrigate the remainder of the flexor sheath.  Again this portion of the sheath was irrigated thoroughly with sterile saline solution.  The 16-gauge Angiocath was repositioned through the most distal wound into the flexor tendon sheath while the 14-gauge Angiocath was left in the most proximal wound.  Several 4-0 Prolene interrupted sutures were used to loosely reapproximate each  of these wounds before a sterile bulky dressing was applied to the hand.  The patient was then awakened, extubated, and returned to the recovery room in satisfactory condition after tolerating the procedure well.

## 2021-06-03 NOTE — Anesthesia Preprocedure Evaluation (Signed)
Anesthesia Evaluation  Patient identified by MRN, date of birth, ID band Patient awake    Reviewed: Allergy & Precautions, H&P , NPO status , Patient's Chart, lab work & pertinent test results, reviewed documented beta blocker date and time   History of Anesthesia Complications (+) PONV and history of anesthetic complications  Airway Mallampati: II  TM Distance: >3 FB Neck ROM: full    Dental  (+) Dental Advidsory Given, Implants, Caps, Teeth Intact, Missing   Pulmonary neg shortness of breath, sleep apnea and Continuous Positive Airway Pressure Ventilation , neg COPD, neg recent URI,    Pulmonary exam normal breath sounds clear to auscultation       Cardiovascular Exercise Tolerance: Good (-) hypertension(-) angina(-) Past MI and (-) Cardiac Stents Normal cardiovascular exam(-) dysrhythmias + Valvular Problems/Murmurs MR  Rhythm:regular Rate:Normal     Neuro/Psych  Headaches, neg Seizures  Neuromuscular disease negative psych ROS   GI/Hepatic negative GI ROS, Neg liver ROS,   Endo/Other  negative endocrine ROS  Renal/GU negative Renal ROS  negative genitourinary   Musculoskeletal   Abdominal   Peds  Hematology negative hematology ROS (+)   Anesthesia Other Findings Past Medical History: No date: Arthritis     Comment:  osteoarthriist No date: Cancer ([place])     Comment:  skin No date: Complication of anesthesia     Comment:  nausea 09/2019: DVT (deep venous thrombosis) ([facility]) No date: Heart murmur No date: Mitral valve regurgitation No date: Pulmonary embolism ([facility]) No date: Tendonitis     Comment:  outer aspect of right foot No date: UTI (lower urinary tract infection)   Reproductive/Obstetrics negative OB ROS                             Anesthesia Physical Anesthesia Plan  ASA: 2  Anesthesia Plan: General   Post-op Pain Management:    Induction: Intravenous  PONV Risk  Score and Plan: 4 or greater and TIVA and Propofol infusion  Airway Management Planned: LMA and Oral ETT  Additional Equipment:   Intra-op Plan:   Post-operative Plan: Extubation in OR  Informed Consent: I have reviewed the patients History and Physical, chart, labs and discussed the procedure including the risks, benefits and alternatives for the proposed anesthesia with the patient or authorized representative who has indicated his/her understanding and acceptance.     Dental Advisory Given  Plan Discussed with: Anesthesiologist, CRNA and Surgeon  Anesthesia Plan Comments:         Anesthesia Quick Evaluation

## 2021-06-03 NOTE — ED Triage Notes (Signed)
Pt to ED from [facility] for dog bite to right index finger yesterday. Here for evaluation that tendon is not involved and possibly IV antibiotics.  States husband reported dog bite.

## 2021-06-03 NOTE — ED Provider Notes (Signed)
[facility] Emergency Department Provider Note    Event Date/Time   First MD Initiated Contact with Patient 06/03/21 1035     (approximate)  I have reviewed the triage vital signs and the nursing notes.   HISTORY  Chief Complaint Animal Bite    HPI Dana Bautista is a 75 y.o. female with below listed past medical history presents to the ER for evaluation of right index finger pain and swelling that occurred after she was bit by her dog last night.  States her dog is up-to-date on vaccinations.  She is not up-to-date on her Tdap.  Got bit on the right index finger.  Overnight started having worsening pain and swelling today to the point where she is no longer able to bend or move her finger.  At rest is reporting the pain is mild to moderate in severity.  Past Medical History:  Diagnosis Date   Arthritis    osteoarthriist   Cancer ([place])    skin   Complication of anesthesia    nausea   DVT (deep venous thrombosis) (HCC) 09/2019   Heart murmur    Mitral valve regurgitation    Pulmonary embolism (HCC)    Tendonitis    outer aspect of right foot   UTI (lower urinary tract infection)    Family History  Problem Relation Age of Onset   Breast cancer Mother    Asthma Mother    Lung cancer Father    Heart disease Brother    Heart disease Maternal Grandfather    Throat cancer Maternal Grandfather    Past Surgical History:  Procedure Laterality Date   ABDOMINAL HYSTERECTOMY     APPENDECTOMY     FRACTURE SURGERY     left wrist   FRACTURE SURGERY Left    wrist   TONSILLECTOMY     Patient Active Problem List   Diagnosis Date Noted   History of deep vein thrombosis (DVT) of lower extremity 10/20/2020   History of pulmonary embolism 10/20/2020   Lower leg DVT (deep venous thromboembolism), acute, right ([place]) 10/05/2019   Multiple subsegmental pulmonary emboli without acute cor pulmonale (HCC) 10/04/2019   Pericardial effusion 10/04/2019   Pleural  effusion 10/04/2019   Vitamin B 12 deficiency 08/10/2017   Arm pain, left 04/25/2017   Anemia due to vitamin B12 deficiency 04/13/2017   Chronic constipation 02/25/2017   Intermittent chest pain 11/20/2016   Nausea 11/20/2016   Right ear impacted cerumen 11/20/2016   Carotid stenosis, asymptomatic, bilateral 10/06/2016   Dizziness 06/13/2016   H/O mitral valve prolapse 06/13/2016   Neck muscle strain 06/13/2016   Temporal headache 06/13/2016   Vitamin D deficiency, unspecified 12/21/2015   Chronic right-sided low back pain without sciatica 12/16/2015   Meralgia paresthetica of right side 12/16/2015   Prediabetes 12/16/2015   Urge urinary incontinence 12/16/2015      Prior to Admission medications   Medication Sig Start Date End Date Taking? Authorizing Provider  apixaban (ELIQUIS) 2.5 MG TABS tablet Take 1 tablet (2.5 mg total) by mouth 2 (two) times daily. 10/20/20   Earlie Server, MD  ascorbic acid (VITAMIN C) 500 MG tablet Take 500 mg by mouth daily.    [provider]  calcium carbonate (OSCAL) 1500 (600 Ca) MG TABS tablet Take 600 mg of elemental calcium by mouth 2 (two) times daily with a meal. Patient not taking: Reported on 05/27/2021    [provider]  cetirizine (ZYRTEC) 10 MG tablet  04/01/20  [provider]  clindamycin (CLEOCIN) 150 MG capsule  04/01/20   [provider]  cyanocobalamin (,VITAMIN B-12,) 1000 MCG/ML injection Inject 1,000 mcg into the muscle every 30 (thirty) days.    [provider]  diclofenac Sodium (VOLTAREN) 1 % GEL  03/22/21   [provider]  fluticasone Asencion Islam) 50 MCG/ACT nasal spray  09/17/20   [provider]  HYDROcodone-acetaminophen (NORCO/VICODIN) 5-325 MG tablet Take 1 tablet by mouth every 6 (six) hours as needed. Patient not taking: No sig reported 09/30/19   Chase Picket, MD  lubiprostone (AMITIZA) 24 MCG capsule Take 24 mcg by mouth daily as needed for constipation.  Patient  not taking: Reported on 05/27/2021    [provider]  MAGNESIUM-POTASSIUM PO Take 1 tablet by mouth daily. Patient not taking: No sig reported    [provider]  ondansetron (ZOFRAN ODT) 4 MG disintegrating tablet Take 1 tablet (4 mg total) by mouth every 8 (eight) hours as needed for nausea or vomiting. Patient not taking: No sig reported 09/30/19   Chase Picket, MD  triamcinolone cream (KENALOG) 0.1 % Apply 1 application topically 2 (two) times daily. Patient not taking: No sig reported 03/27/20   Faustino Congress, NP  VITAMIN D, CHOLECALCIFEROL, PO Take by mouth. Patient not taking: No sig reported    [provider]  Vitamin D, Ergocalciferol, (DRISDOL) 50000 units CAPS capsule Take 50,000 Units by mouth every 7 (seven) days. Patient not taking: No sig reported    [provider]    Allergies Codeine, Elemental sulfur, Levaquin [levofloxacin], Penicillins, Sulfa antibiotics, and Latex    Social History Social History   Tobacco Use   Smoking status: Never   Smokeless tobacco: Never  Vaping Use   Vaping Use: Never used  Substance Use Topics   Alcohol use: No   Drug use: No    Review of Systems Patient denies headaches, rhinorrhea, blurry vision, numbness, shortness of breath, chest pain, edema, cough, abdominal pain, nausea, vomiting, diarrhea, dysuria, fevers, rashes or hallucinations unless otherwise stated above in HPI. ____________________________________________   PHYSICAL EXAM:  VITAL SIGNS: Vitals:   06/03/21 1013 06/03/21 1152  BP: 102/76 (!) 142/95  Pulse: 73 76  Resp: 18 18  Temp: 98 F (36.7 C)   SpO2: 100% 98%    Constitutional: Alert and oriented.  Eyes: Conjunctivae are normal.  Head: Atraumatic. Nose: No congestion/rhinnorhea. Mouth/Throat: Mucous membranes are moist.   Neck: No stridor. Painless ROM.  Cardiovascular: Normal rate, regular rhythm. Grossly normal heart sounds.  Good peripheral  circulation. Respiratory: Normal respiratory effort.  No retractions. Lungs CTAB. Gastrointestinal: Soft and nontender. No distention. No abdominal bruits. No CVA tenderness. Genitourinary:  Musculoskeletal: Fusiform swelling and redness of the right index finger with 2 puncture wounds hemostatic.  Pain along the flexor tendon sheath.  Streaking erythema crossing the MCP.  No lower extremity tenderness nor edema.  No joint effusions. Neurologic:  Normal speech and language. No gross focal neurologic deficits are appreciated. No facial droop Skin:  Skin is warm, dry and intact. No rash noted. Psychiatric: Mood and affect are normal. Speech and behavior are normal.  ____________________________________________   LABS (all labs ordered are listed, but only abnormal results are displayed)  Results for orders placed or performed during the hospital encounter of 06/03/21 (from the past 24 hour(s))  CBC with Differential     Status: Abnormal   Collection Time: 06/03/21 11:20 AM  Result Value Ref Range   WBC  9.5 4.0 - 10.5 K/uL   RBC 4.25 3.87 - 5.11 MIL/uL   Hemoglobin 11.9 (L) 12.0 - 15.0 g/dL   HCT 36.7 36.0 - 46.0 %   MCV 86.4 80.0 - 100.0 fL   MCH 28.0 26.0 - 34.0 pg   MCHC 32.4 30.0 - 36.0 g/dL   RDW 13.3 11.5 - 15.5 %   Platelets 260 150 - 400 K/uL   nRBC 0.0 0.0 - 0.2 %   Neutrophils Relative % 67 %   Neutro Abs 6.4 1.7 - 7.7 K/uL   Lymphocytes Relative 24 %   Lymphs Abs 2.3 0.7 - 4.0 K/uL   Monocytes Relative 8 %   Monocytes Absolute 0.8 0.1 - 1.0 K/uL   Eosinophils Relative 1 %   Eosinophils Absolute 0.1 0.0 - 0.5 K/uL   Basophils Relative 0 %   Basophils Absolute 0.0 0.0 - 0.1 K/uL   Immature Granulocytes 0 %   Abs Immature Granulocytes 0.03 0.00 - 0.07 K/uL  Basic metabolic panel     Status: Abnormal   Collection Time: 06/03/21 11:20 AM  Result Value Ref Range   Sodium 135 135 - 145 mmol/L   Potassium 4.0 3.5 - 5.1 mmol/L   Chloride 102 98 - 111 mmol/L   CO2 27 22 -  32 mmol/L   Glucose, Bld 104 (H) 70 - 99 mg/dL   BUN 10 8 - 23 mg/dL   Creatinine, Ser 0.55 0.44 - 1.00 mg/dL   Calcium 9.2 8.9 - 10.3 mg/dL   GFR, Estimated >60 >60 mL/min   Anion gap 6 5 - 15  Sedimentation rate     Status: None   Collection Time: 06/03/21 11:20 AM  Result Value Ref Range   Sed Rate 27 0 - 30 mm/hr   ____________________________________________ ____________________________________________  RADIOLOGY  I personally reviewed all radiographic images ordered to evaluate for the above acute complaints and reviewed radiology reports and findings.  These findings were personally discussed with the patient.  Please see medical record for radiology report.  ____________________________________________   PROCEDURES  Procedure(s) performed:  Procedures    Critical Care performed: no ____________________________________________   INITIAL IMPRESSION / ASSESSMENT AND PLAN / ED COURSE  Pertinent labs & imaging results that were available during my care of the patient were reviewed by me and considered in my medical decision making (see chart for details).   DDX: flexor teno, cellulitis, fb, fracture, dislocation  Artha Zemba is a 75 y.o. who presents to the ED with pain and swelling from dog bite to the right index finger as described above she is afebrile had quite a bit of pain in that area does have all 4 [place] signs on presentation concerning for flexor tenosynovitis.  X-ray ordered without evidence of clear foreign body.  Will consult orthopedics.  Will give IV antibiotics.  Clinical Course as of 06/03/21 1209  Thu Jun 03, 2021  1138 Discussed case with Dr. Roland Rack of orthopedics who agrees to take patient to the OR for further management.  Patient receiving IV antibiotics IVF and IV narcotic pain medication. [PR]    Clinical Course User Index [PR] Merlyn Lot, MD    The patient was evaluated in Emergency Department today for the symptoms described in the  history of present illness. He/she was evaluated in the context of the global COVID-19 pandemic, which necessitated consideration that the patient might be at risk for infection with the SARS-CoV-2 virus that causes COVID-19. Institutional protocols and algorithms that pertain to  the evaluation of patients at risk for COVID-19 are in a state of rapid change based on information released by regulatory bodies including the CDC and federal and state organizations. These policies and algorithms were followed during the patient's care in the ED.  As part of my medical decision making, I reviewed the following data within the [facility] notes reviewed and incorporated, Labs reviewed, notes from prior ED visits and [place] Controlled Substance Database   ____________________________________________   FINAL CLINICAL IMPRESSION(S) / ED DIAGNOSES  Final diagnoses:  Dog bite, initial encounter  Flexor tenosynovitis of finger      NEW MEDICATIONS STARTED DURING THIS VISIT:  New Prescriptions   No medications on file     Note:  This document was prepared using Dragon voice recognition software and may include unintentional dictation errors.    Merlyn Lot, MD 06/03/21 1209

## 2021-06-03 NOTE — H&P (Signed)
History and Physical    Dana Bautista UXY:[id number] DOB: April 25, 1946 DOA: 06/03/2021  Referring MD/NP/PA:   PCP: Marinda Elk, MD   Patient coming from:  The patient is coming from home.  At baseline, pt is independent for most of ADL.        Chief Complaint: right hand pain after dog bite  HPI: Dana Bautista is a 75 y.o. female with medical history significant of PE and DVT on Eliquis, mitral valve regurgitation, skin cancer, anemia, pleural effusion, pericardial effusion, who presents with right hand pain after dog bite.  Pt state that developed pain, swelling and redness in the right index finger after she was bit by her dog last night.  The pain is constant, sharp, 8 out of 10 in severity, nonradiating.  Patient does not have fever or chills.  Patient has headache.  Denies chest pain, cough, shortness of breath.  Patient has nausea, no vomiting, diarrhea or abdominal pain.  No symptoms of UTI.  Patient states that she took her last dose of Eliquis last night.  ED Course: pt was found to have WBC 9.5, negative COVID PCR, electrolytes renal function okay, temperature normal, blood pressure 142/95, heart rate 76, RR 18, oxygen saturation 98% on room air.  X-ray of right finger is negative for bony fracture or foreign antibodies.  Patient is placed on MedSurg bed for observation, Dr. Roland Rack of Ortho is consulted.  Review of Systems:   General: no fevers, chills, no body weight gain, fatigue HEENT: no blurry vision, hearing changes or sore throat Respiratory: no dyspnea, coughing, wheezing CV: no chest pain, no palpitations GI: has nausea, no vomiting, abdominal pain, diarrhea, constipation GU: no dysuria, burning on urination, increased urinary frequency, hematuria  Ext: no leg edema Neuro: no unilateral weakness, numbness, or tingling, no vision change or hearing loss Skin: no rash. Has swelling and pain in right hand. MSK: No muscle spasm, no deformity, no limitation of range of  movement in spin Heme: No easy bruising.  Travel history: No recent long distant travel.  Allergy:  Allergies  Allergen Reactions   Codeine Nausea And Vomiting   Elemental Sulfur Other (See Comments)    Lip swelling, difficulty breathing   Levaquin [Levofloxacin] Hives   Penicillins Hives and Other (See Comments)    Did it involve swelling of the face/tongue/throat, SOB, or low BP? Unknown Did it involve sudden or severe rash/hives, skin peeling, or any reaction on the inside of your mouth or nose? Yes Did you need to seek medical attention at a hospital or doctor's office? Yes When did it last happen? many years If all above answers are "NO", may proceed with cephalosporin use.    Sulfa Antibiotics     Other reaction(s): lip swelling   Latex Rash    Past Medical History:  Diagnosis Date   Arthritis    osteoarthriist   Cancer ([place])    skin   Complication of anesthesia    nausea   DVT (deep venous thrombosis) (HCC) 09/2019   Heart murmur    Mitral valve regurgitation    Pulmonary embolism (HCC)    Tendonitis    outer aspect of right foot   UTI (lower urinary tract infection)     Past Surgical History:  Procedure Laterality Date   ABDOMINAL HYSTERECTOMY     APPENDECTOMY     FRACTURE SURGERY     left wrist   FRACTURE SURGERY Left    wrist   TONSILLECTOMY  Social History:  reports that she has never smoked. She has never used smokeless tobacco. She reports that she does not drink alcohol and does not use drugs.  Family History:  Family History  Problem Relation Age of Onset   Breast cancer Mother    Asthma Mother    Lung cancer Father    Heart disease Brother    Heart disease Maternal Grandfather    Throat cancer Maternal Grandfather      Prior to Admission medications   Medication Sig Start Date End Date Taking? Authorizing Provider  apixaban (ELIQUIS) 2.5 MG TABS tablet Take 1 tablet (2.5 mg total) by mouth 2 (two) times daily. 10/20/20   Earlie Server,  MD  ascorbic acid (VITAMIN C) 500 MG tablet Take 500 mg by mouth daily.    [provider]  calcium carbonate (OSCAL) 1500 (600 Ca) MG TABS tablet Take 600 mg of elemental calcium by mouth 2 (two) times daily with a meal. Patient not taking: Reported on 05/27/2021    [provider]  cetirizine (ZYRTEC) 10 MG tablet  04/01/20   [provider]  clindamycin (CLEOCIN) 150 MG capsule  04/01/20   [provider]  cyanocobalamin (,VITAMIN B-12,) 1000 MCG/ML injection Inject 1,000 mcg into the muscle every 30 (thirty) days.    [provider]  diclofenac Sodium (VOLTAREN) 1 % GEL  03/22/21   [provider]  fluticasone Asencion Islam) 50 MCG/ACT nasal spray  09/17/20   [provider]  HYDROcodone-acetaminophen (NORCO/VICODIN) 5-325 MG tablet Take 1 tablet by mouth every 6 (six) hours as needed. Patient not taking: No sig reported 09/30/19   Chase Picket, MD  lubiprostone (AMITIZA) 24 MCG capsule Take 24 mcg by mouth daily as needed for constipation.  Patient not taking: Reported on 05/27/2021    [provider]  MAGNESIUM-POTASSIUM PO Take 1 tablet by mouth daily. Patient not taking: No sig reported    [provider]  ondansetron (ZOFRAN ODT) 4 MG disintegrating tablet Take 1 tablet (4 mg total) by mouth every 8 (eight) hours as needed for nausea or vomiting. Patient not taking: No sig reported 09/30/19   Chase Picket, MD  triamcinolone cream (KENALOG) 0.1 % Apply 1 application topically 2 (two) times daily. Patient not taking: No sig reported 03/27/20   Faustino Congress, NP  VITAMIN D, CHOLECALCIFEROL, PO Take by mouth. Patient not taking: No sig reported    [provider]  Vitamin D, Ergocalciferol, (DRISDOL) 50000 units CAPS capsule Take 50,000 Units by mouth every 7 (seven) days. Patient not taking: No sig reported    [provider]    Physical Exam: Vitals:   06/03/21 1705 06/03/21 1715 06/03/21  1727 06/03/21 1750  BP: (!) 107/56 (!) 104/56 112/62 109/80  Pulse: 63 (!) 59 61 70  Resp: 18 (!) 21 17 18   Temp:  98.8 F (37.1 C)  98.1 F (36.7 C)  TempSrc:    Oral  SpO2: 91% 100% 98% 95%  Weight:      Height:       General: Not in acute distress HEENT:       Eyes: PERRL, EOMI, no scleral icterus.       ENT: No discharge from the ears and nose, no pharynx injection, no tonsillar enlargement.        Neck: No JVD, no bruit, no mass felt. Heme: No neck lymph node enlargement. Cardiac: S1/S2, RRR, No murmurs, No gallops or rubs. Respiratory: No rales, wheezing,  rhonchi or rubs. GI: Soft, nondistended, nontender, no rebound pain, no organomegaly, BS present. GU: No hematuria Ext: No pitting leg edema bilaterally. 1+DP/PT pulse bilaterally. Musculoskeletal: No joint deformities, No joint redness or warmth, no limitation of ROM in spin. Skin: has swelling, erythema, tenderness and warmth in right index finger, with 2 puncture wounds. Pain is along the flexor tendon sheath.  Streaking erythema crossing the MCP.           Neuro: Alert, oriented X3, cranial nerves II-XII grossly intact, moves all extremities normally.  Psych: Patient is not psychotic, no suicidal or hemocidal ideation.  Labs on Admission: I have personally reviewed following labs and imaging studies  CBC: Recent Labs  Lab 06/03/21 1120  WBC 9.5  NEUTROABS 6.4  HGB 11.9*  HCT 36.7  MCV 86.4  PLT 967   Basic Metabolic Panel: Recent Labs  Lab 06/03/21 1120  NA 135  K 4.0  CL 102  CO2 27  GLUCOSE 104*  BUN 10  CREATININE 0.55  CALCIUM 9.2   GFR: Estimated Creatinine Clearance: 73.8 mL/min (by C-G formula based on SCr of 0.55 mg/dL). Liver Function Tests: No results for input(s): AST, ALT, ALKPHOS, BILITOT, PROT, ALBUMIN in the last 168 hours. No results for input(s): LIPASE, AMYLASE in the last 168 hours. No results for input(s): AMMONIA in the last 168 hours. Coagulation Profile: No results  for input(s): INR, PROTIME in the last 168 hours. Cardiac Enzymes: No results for input(s): CKTOTAL, CKMB, CKMBINDEX, TROPONINI in the last 168 hours. BNP (last 3 results) No results for input(s): PROBNP in the last 8760 hours. HbA1C: No results for input(s): HGBA1C in the last 72 hours. CBG: No results for input(s): GLUCAP in the last 168 hours. Lipid Profile: No results for input(s): CHOL, HDL, LDLCALC, TRIG, CHOLHDL, LDLDIRECT in the last 72 hours. Thyroid Function Tests: No results for input(s): TSH, T4TOTAL, FREET4, T3FREE, THYROIDAB in the last 72 hours. Anemia Panel: No results for input(s): VITAMINB12, FOLATE, FERRITIN, TIBC, IRON, RETICCTPCT in the last 72 hours. Urine analysis:    Component Value Date/Time   COLORURINE YELLOW 09/30/2019 1309   APPEARANCEUR CLEAR 09/30/2019 1309   LABSPEC 1.020 09/30/2019 1309   PHURINE 7.0 09/30/2019 1309   GLUCOSEU NEGATIVE 09/30/2019 1309   HGBUR NEGATIVE 09/30/2019 1309   BILIRUBINUR NEGATIVE 09/30/2019 1309   KETONESUR NEGATIVE 09/30/2019 1309   PROTEINUR NEGATIVE 09/30/2019 1309   NITRITE NEGATIVE 09/30/2019 1309   LEUKOCYTESUR TRACE (A) 09/30/2019 1309   Sepsis Labs: @LABRCNTIP (procalcitonin:4,lacticidven:4) ) Recent Results (from the past 240 hour(s))  Resp Panel by RT-PCR (Flu A&B, Covid) Nasopharyngeal Swab     Status: None   Collection Time: 06/03/21 11:20 AM   Specimen: Nasopharyngeal Swab; Nasopharyngeal(NP) swabs in vial transport medium  Result Value Ref Range Status   SARS Coronavirus 2 by RT PCR NEGATIVE NEGATIVE Final    Comment: (NOTE) SARS-CoV-2 target nucleic acids are NOT DETECTED.  The SARS-CoV-2 RNA is generally detectable in upper respiratory specimens during the acute phase of infection. The lowest concentration of SARS-CoV-2 viral copies this assay can detect is 138 copies/mL. A negative result does not preclude SARS-Cov-2 infection and should not be used as the sole basis for treatment or other  patient management decisions. A negative result may occur with  improper specimen collection/handling, submission of specimen other than nasopharyngeal swab, presence of viral mutation(s) within the areas targeted by this assay, and inadequate number of viral copies(<138 copies/mL). A negative result must be combined with clinical observations,  patient history, and epidemiological information. The expected result is Negative.  Fact Sheet for Patients:  EntrepreneurPulse.com.au  Fact Sheet for Healthcare Providers:  IncredibleEmployment.be  This test is no t yet approved or cleared by the [place] FDA and  has been authorized for detection and/or diagnosis of SARS-CoV-2 by FDA under an Emergency Use Authorization (EUA). This EUA will remain  in effect (meaning this test can be used) for the duration of the COVID-19 declaration under Section 564(b)(1) of the Act, 21 U.S.C.section 360bbb-3(b)(1), unless the authorization is terminated  or revoked sooner.       Influenza A by PCR NEGATIVE NEGATIVE Final   Influenza B by PCR NEGATIVE NEGATIVE Final    Comment: (NOTE) The Xpert Xpress SARS-CoV-2/FLU/RSV plus assay is intended as an aid in the diagnosis of influenza from Nasopharyngeal swab specimens and should not be used as a sole basis for treatment. Nasal washings and aspirates are unacceptable for Xpert Xpress SARS-CoV-2/FLU/RSV testing.  Fact Sheet for Patients: EntrepreneurPulse.com.au  Fact Sheet for Healthcare Providers: IncredibleEmployment.be  This test is not yet approved or cleared by the [place] FDA and has been authorized for detection and/or diagnosis of SARS-CoV-2 by FDA under an Emergency Use Authorization (EUA). This EUA will remain in effect (meaning this test can be used) for the duration of the COVID-19 declaration under Section 564(b)(1) of the Act, 21 U.S.C. section  360bbb-3(b)(1), unless the authorization is terminated or revoked.  Performed at [facility], [address] [place], [place] [address]      Radiological Exams on Admission: DG Finger Index Right  Result Date: 06/03/2021 CLINICAL DATA:  Dog bite EXAM: RIGHT INDEX FINGER 2+V COMPARISON:  None. FINDINGS: No evidence of acute fracture. No dislocation. Degenerative changes are visualized at the first [facility] joint. Soft tissue swelling of the index finger most pronounced at the level of the PIP joint. No radiopaque foreign body identified within the soft tissues. IMPRESSION: Soft tissue swelling without acute fracture or dislocation. No radiopaque foreign body identified within the soft tissues. Electronically Signed   By: Davina Poke D.O.   On: 06/03/2021 12:02     EKG:  Not done in ED, will get one.   Assessment/Plan Principal Problem:   Cellulitis of right hand Active Problems:   History of deep vein thrombosis (DVT) of lower extremity   History of pulmonary embolism   Dog bite of right hand   Normocytic anemia   Flexor tenosynovitis of finger   Cellulitis of right hand, flexor tenosynovitis of finger and dog bite of right hand: Patient is nonseptic.  Hemodynamically stable.  Dr. Roland Rack did I&D today.  - Placed on MedSurg bed for observation - Empiric antimicrobial treatment with doxycycline and Flagyl (patient received 1 dose of clindamycin and vancomycin in ED) - PRN Zofran for nausea, morphine and Percocet for pain - Blood cultures x 2  - ESR and CRP  History of deep vein thrombosis (DVT) of lower extremity and history of pulmonary embolism -will restart Eliquis in AM   Normocytic anemia: Hemoglobin stable, 11.9. -f/u with CBC            DVT ppx: SCD Code Status: Full code Family Communication: not done, no family member is at bed side.   Disposition Plan:  Anticipate discharge back to previous environment Consults called:  Dr. Roland Rack of  ortho Admission status and Level of care: Med-Surg:  for obs          Status is: Observation  The patient remains OBS appropriate and will d/c before 2 midnights.  Dispo: The patient is from: Home              Anticipated d/c is to: Home              Patient currently is not medically stable to d/c.   Difficult to place patient No          Date of Service 06/03/2021    [facility]   If 7PM-7AM, please contact night-coverage [email or web address] 06/03/2021, 6:40 PM

## 2021-06-03 NOTE — Consult Note (Signed)
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 Unknown Did it involve sudden or severe rash/hives, skin peeling, or any reaction on the inside of your mouth or nose? Yes Did you need to seek medical attention at a hospital or doctor's office? Yes When did it last happen? many years If all above answers are "NO", may proceed with cephalosporin use.    Sulfa Antibiotics     Other reaction(s): lip swelling   Latex Rash    Patient Measurements: Height: '5\' 6"'$  (167.6 cm) Weight: 94.8 kg (209 lb) IBW/kg (Calculated) : 59.3 Heparin Dosing Weight: 80.3 kg  Vital Signs: Temp: 98.2 F (36.8 C) (09/08 1336) Temp Source: Oral (09/08 1013) BP: 142/95 (09/08 1152) Pulse Rate: 71 (09/08 1336)  Labs: Recent Labs    06/03/21 1120  HGB 11.9*  HCT 36.7  PLT 260  CREATININE 0.55    Estimated Creatinine Clearance: 73.8 mL/min (by C-G formula based on SCr of 0.55 mg/dL).   Medical History: Past Medical History:  Diagnosis Date   Arthritis    osteoarthriist   Cancer ([place])    skin   Complication of anesthesia    nausea   DVT (deep venous thrombosis) (HCC) 09/2019   Heart murmur    Mitral valve regurgitation    Pulmonary embolism (HCC)    Tendonitis    outer aspect of right foot   UTI (lower urinary tract infection)     Medications:  Apixaban 2.5 mg BID -- last dose 06/02/21 -- unknown time  Assessment: 75 y.o. female with history of DVT with pulmonary embolus diagnosed in January, 2021. 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 Pharmacy has  been consulted to transition to heparin therapy.  Baseline aPTT, HL ordered    Goal of Therapy:  Heparin level 0.3-0.7 units/ml Monitor platelets by anticoagulation protocol: Yes   Plan:  Start heparin infusion at 1300 units/hr Check anti-Xa/aPTT level in 8 hours and daily while on heparin Continue to monitor H&H and platelets  Dorothe Pea, PharmD, BCPS Clinical Pharmacist   06/03/2021,3:04 PM

## 2021-06-04 ENCOUNTER — Encounter: Payer: Self-pay | Admitting: Surgery

## 2021-06-04 DIAGNOSIS — S61250A Open bite of right index finger without damage to nail, initial encounter: Secondary | ICD-10-CM | POA: Diagnosis present

## 2021-06-04 DIAGNOSIS — W540XXA Bitten by dog, initial encounter: Secondary | ICD-10-CM | POA: Diagnosis not present

## 2021-06-04 DIAGNOSIS — S61451A Open bite of right hand, initial encounter: Secondary | ICD-10-CM | POA: Diagnosis not present

## 2021-06-04 DIAGNOSIS — M65841 Other synovitis and tenosynovitis, right hand: Secondary | ICD-10-CM | POA: Diagnosis present

## 2021-06-04 DIAGNOSIS — Z79899 Other long term (current) drug therapy: Secondary | ICD-10-CM | POA: Diagnosis not present

## 2021-06-04 DIAGNOSIS — Z86711 Personal history of pulmonary embolism: Secondary | ICD-10-CM | POA: Diagnosis not present

## 2021-06-04 DIAGNOSIS — M1990 Unspecified osteoarthritis, unspecified site: Secondary | ICD-10-CM | POA: Diagnosis present

## 2021-06-04 DIAGNOSIS — L03113 Cellulitis of right upper limb: Secondary | ICD-10-CM | POA: Diagnosis present

## 2021-06-04 DIAGNOSIS — Z880 Allergy status to penicillin: Secondary | ICD-10-CM | POA: Diagnosis not present

## 2021-06-04 DIAGNOSIS — Z801 Family history of malignant neoplasm of trachea, bronchus and lung: Secondary | ICD-10-CM | POA: Diagnosis not present

## 2021-06-04 DIAGNOSIS — M659 Synovitis and tenosynovitis, unspecified: Secondary | ICD-10-CM | POA: Diagnosis not present

## 2021-06-04 DIAGNOSIS — E871 Hypo-osmolality and hyponatremia: Secondary | ICD-10-CM | POA: Diagnosis present

## 2021-06-04 DIAGNOSIS — Z91040 Latex allergy status: Secondary | ICD-10-CM | POA: Diagnosis not present

## 2021-06-04 DIAGNOSIS — Z808 Family history of malignant neoplasm of other organs or systems: Secondary | ICD-10-CM | POA: Diagnosis not present

## 2021-06-04 DIAGNOSIS — Z7901 Long term (current) use of anticoagulants: Secondary | ICD-10-CM | POA: Diagnosis not present

## 2021-06-04 DIAGNOSIS — Z882 Allergy status to sulfonamides status: Secondary | ICD-10-CM | POA: Diagnosis not present

## 2021-06-04 DIAGNOSIS — Z888 Allergy status to other drugs, medicaments and biological substances status: Secondary | ICD-10-CM | POA: Diagnosis not present

## 2021-06-04 DIAGNOSIS — Z803 Family history of malignant neoplasm of breast: Secondary | ICD-10-CM | POA: Diagnosis not present

## 2021-06-04 DIAGNOSIS — A280 Pasteurellosis: Secondary | ICD-10-CM | POA: Diagnosis present

## 2021-06-04 DIAGNOSIS — Z8249 Family history of ischemic heart disease and other diseases of the circulatory system: Secondary | ICD-10-CM | POA: Diagnosis not present

## 2021-06-04 DIAGNOSIS — Z825 Family history of asthma and other chronic lower respiratory diseases: Secondary | ICD-10-CM | POA: Diagnosis not present

## 2021-06-04 DIAGNOSIS — Z20822 Contact with and (suspected) exposure to covid-19: Secondary | ICD-10-CM | POA: Diagnosis present

## 2021-06-04 DIAGNOSIS — D649 Anemia, unspecified: Secondary | ICD-10-CM | POA: Diagnosis present

## 2021-06-04 DIAGNOSIS — Z85828 Personal history of other malignant neoplasm of skin: Secondary | ICD-10-CM | POA: Diagnosis not present

## 2021-06-04 DIAGNOSIS — Z86718 Personal history of other venous thrombosis and embolism: Secondary | ICD-10-CM | POA: Diagnosis not present

## 2021-06-04 DIAGNOSIS — L0390 Cellulitis, unspecified: Secondary | ICD-10-CM | POA: Diagnosis present

## 2021-06-04 DIAGNOSIS — R112 Nausea with vomiting, unspecified: Secondary | ICD-10-CM | POA: Diagnosis not present

## 2021-06-04 DIAGNOSIS — Z23 Encounter for immunization: Secondary | ICD-10-CM | POA: Diagnosis present

## 2021-06-04 LAB — BASIC METABOLIC PANEL
Anion gap: 5 (ref 5–15)
BUN: 12 mg/dL (ref 8–23)
CO2: 24 mmol/L (ref 22–32)
Calcium: 8.3 mg/dL — ABNORMAL LOW (ref 8.9–10.3)
Chloride: 105 mmol/L (ref 98–111)
Creatinine, Ser: 0.54 mg/dL (ref 0.44–1.00)
GFR, Estimated: >60 mL/min (ref >60)
Glucose, Bld: 119 mg/dL — ABNORMAL HIGH (ref 70–99)
Potassium: 4.5 mmol/L (ref 3.5–5.1)
Sodium: 134 mmol/L — ABNORMAL LOW (ref 135–145)

## 2021-06-04 LAB — CBC
HCT: 32.5 % — ABNORMAL LOW (ref 36.0–46.0)
Hemoglobin: 10.5 g/dL — ABNORMAL LOW (ref 12.0–15.0)
MCH: 27.9 pg (ref 26.0–34.0)
MCHC: 32.3 g/dL (ref 30.0–36.0)
MCV: 86.4 fL (ref 80.0–100.0)
Platelets: 221 10*3/uL (ref 150–400)
RBC: 3.76 MIL/uL — ABNORMAL LOW (ref 3.87–5.11)
RDW: 13.2 % (ref 11.5–15.5)
WBC: 6.3 10*3/uL (ref 4.0–10.5)
nRBC: 0 % (ref 0.0–0.2)

## 2021-06-04 NOTE — Progress Notes (Signed)
  Subjective: 1 Day Post-Op Procedure(s) (LRB): INCISION AND DRAINAGE-Right Index Finger (Right) Patient reports pain as mild.   Patient is well, and has had no acute complaints or problems Plan is to go Home after hospital stay. Negative for chest pain and shortness of breath Fever: no Gastrointestinal:Some nausea earlier today, improved with medication.  Objective: Vital signs in last 24 hours: Temp:  [97.6 F (36.4 C)-98.8 F (37.1 C)] 98.5 F (36.9 C) (09/09 1551) Pulse Rate:  [58-70] 60 (09/09 1551) Resp:  [11-21] 16 (09/09 1551) BP: (96-130)/(46-80) 116/53 (09/09 1551) SpO2:  [91 %-100 %] 99 % (09/09 1551)  Intake/Output from previous day:  Intake/Output Summary (Last 24 hours) at 06/04/2021 1658 Last data filed at 06/04/2021 1022 Gross per 24 hour  Intake 1293.6 ml  Output --  Net 1293.6 ml    Intake/Output this shift: Total I/O In: 240 [P.O.:240] Out: -   Labs: Recent Labs    06/03/21 1120 06/04/21 0428  HGB 11.9* 10.5*   Recent Labs    06/03/21 1120 06/04/21 0428  WBC 9.5 6.3  RBC 4.25 3.76*  HCT 36.7 32.5*  PLT 260 221   Recent Labs    06/03/21 1120 06/04/21 0428  NA 135 134*  K 4.0 4.5  CL 102 105  CO2 27 24  BUN 10 12  CREATININE 0.55 0.54  GLUCOSE 104* 119*  CALCIUM 9.2 8.3*   No results for input(s): LABPT, INR in the last 72 hours.   EXAM General - Patient is Alert, Appropriate, and Oriented Extremity -  Bulky dressing removed from the right hand. Index finger with continued swelling but improved compared to before surgery and erythema improving. No purulent material can be expressed.   IV Catheters used to keep the wound open were removed without complication. Xerofoam and new bulky dressing applied. Intact to light touch to the right hand.  Past Medical History:  Diagnosis Date   Arthritis    osteoarthriist   Cancer ([place])    skin   Complication of anesthesia    nausea   DVT (deep venous thrombosis) ([place]) 09/2019    Heart murmur    Mitral valve regurgitation    Pulmonary embolism (HCC)    Tendonitis    outer aspect of right foot   UTI (lower urinary tract infection)     Assessment/Plan: 1 Day Post-Op Procedure(s) (LRB): INCISION AND DRAINAGE-Right Index Finger (Right) Principal Problem:   Cellulitis of right hand Active Problems:   History of deep vein thrombosis (DVT) of lower extremity   History of pulmonary embolism   Dog bite of right hand   Normocytic anemia   Flexor tenosynovitis of finger   Cellulitis  Estimated body mass index is 33.73 kg/m as calculated from the following:   Height as of this encounter: '5\' 6"'$  (1.676 m).   Weight as of this encounter: 94.8 kg. Advance diet  Labs reviewed, no recent fevers. WBC 6.3 this AM. Bulky dressing changed, will perform daily dry dressing changes. Currently on IV Flagyl and Doxycycline.  Cultures pending at this time. Will adjust Abx when cultures have returned.  DVT Prophylaxis -  Eliquis Non-weightbearing to the right hand.  Raquel Kaelynn Igo, PA-C [facility] 06/04/2021, 4:58 PM

## 2021-06-04 NOTE — Progress Notes (Signed)
Patient ID: Dana Bautista, female   DOB: 03-28-1946, 75 y.o.   MRN: [id number]  PROGRESS NOTE    Dana Bautista  [id number] DOB: 09-16-1946 DOA: 06/03/2021 PCP: Marinda Elk, MD   Brief Narrative:  75 y.o. female with medical history significant of PE and DVT on Eliquis, mitral valve regurgitation, skin cancer, anemia, pleural effusion, pericardial effusion presents with right hand pain after dog bite.  On presentation x-ray of right finger was negative for bony fracture or foreign bodies.  She was started on IV antibiotics.  Orthopedics/Dr. Roland Rack was consulted.  She underwent I&D on 06/03/2021.  Assessment & Plan:   Right hand cellulitis/flexor tenosynovitis of right index finger status post dog bite -Status post I&D on 06/03/2021.  Follow wound cultures and further recommendations from orthopedics/Dr. Poggi.  Wound care as per orthopedics. -Currently on doxycycline and Flagyl.  History of DVT and PE -Continue Eliquis  Chronic normocytic anemia -Questionable cause.  Monitor  Mild hyponatremia -Monitor   DVT prophylaxis: Eliquis Code Status: Full Family Communication: None at bedside Disposition Plan: Status is: Observation  The patient will require care spanning > 2 midnights and should be moved to inpatient because: Inpatient level of care appropriate due to severity of illness  Dispo: The patient is from: Home              Anticipated d/c is to: Home              Patient currently is not medically stable to d/c.   Difficult to place patient No  Consultants: Orthopedics  Procedures: I&D of right finger on 06/03/2021  Antimicrobials: Doxycycline and Flagyl from 06/03/2021 onwards   Subjective: Patient seen and examined at bedside.  Right hand pain is improving.  No overnight fever, nausea or vomiting.  Objective: Vitals:   06/03/21 2311 06/03/21 2318 06/04/21 0547 06/04/21 0808  BP: (!) 96/46 (!) 96/46 (!) 108/57 130/69  Pulse: (!) 59 61 (!) 59 62  Resp: '14 14 16 16   '$ Temp: 97.6 F (36.4 C) 97.6 F (36.4 C) 97.6 F (36.4 C) 97.7 F (36.5 C)  TempSrc: Oral Oral Oral Oral  SpO2: 94% 94% 99% 100%  Weight:      Height:        Intake/Output Summary (Last 24 hours) at 06/04/2021 1013 Last data filed at 06/04/2021 0500 Gross per 24 hour  Intake 1553.6 ml  Output 5 ml  Net 1548.6 ml   Filed Weights   06/03/21 1015 06/03/21 1336  Weight: 94.8 kg 94.8 kg    Examination:  General exam: Appears calm and comfortable.  Currently on room air. Respiratory system: Bilateral decreased breath sounds at bases Cardiovascular system: S1 & S2 heard, Rate controlled Gastrointestinal system: Abdomen is nondistended, soft and nontender. Normal bowel sounds heard. Extremities: Right index finger dressing present.   Central nervous system: Alert and oriented. No focal neurological deficits. Moving extremities Skin: No other rashes, lesions or ulcers Psychiatry: Judgement and insight appear normal. Mood & affect appropriate.     Data Reviewed: I have personally reviewed following labs and imaging studies  CBC: Recent Labs  Lab 06/03/21 1120 06/04/21 0428  WBC 9.5 6.3  NEUTROABS 6.4  --   HGB 11.9* 10.5*  HCT 36.7 32.5*  MCV 86.4 86.4  PLT 260 [id number]   Basic Metabolic Panel: Recent Labs  Lab 06/03/21 1120 06/04/21 0428  NA 135 134*  K 4.0 4.5  CL 102 105  CO2 27 24  GLUCOSE 104* 119*  BUN 10 12  CREATININE 0.55 0.54  CALCIUM 9.2 8.3*   GFR: Estimated Creatinine Clearance: 73.8 mL/min (by C-G formula based on SCr of 0.54 mg/dL). Liver Function Tests: No results for input(s): AST, ALT, ALKPHOS, BILITOT, PROT, ALBUMIN in the last 168 hours. No results for input(s): LIPASE, AMYLASE in the last 168 hours. No results for input(s): AMMONIA in the last 168 hours. Coagulation Profile: No results for input(s): INR, PROTIME in the last 168 hours. Cardiac Enzymes: No results for input(s): CKTOTAL, CKMB, CKMBINDEX, TROPONINI in the last 168 hours. BNP  (last 3 results) No results for input(s): PROBNP in the last 8760 hours. HbA1C: No results for input(s): HGBA1C in the last 72 hours. CBG: No results for input(s): GLUCAP in the last 168 hours. Lipid Profile: No results for input(s): CHOL, HDL, LDLCALC, TRIG, CHOLHDL, LDLDIRECT in the last 72 hours. Thyroid Function Tests: No results for input(s): TSH, T4TOTAL, FREET4, T3FREE, THYROIDAB in the last 72 hours. Anemia Panel: No results for input(s): VITAMINB12, FOLATE, FERRITIN, TIBC, IRON, RETICCTPCT in the last 72 hours. Sepsis Labs: No results for input(s): PROCALCITON, LATICACIDVEN in the last 168 hours.  Recent Results (from the past 240 hour(s))  Resp Panel by RT-PCR (Flu A&B, Covid) Nasopharyngeal Swab     Status: None   Collection Time: 06/03/21 11:20 AM   Specimen: Nasopharyngeal Swab; Nasopharyngeal(NP) swabs in vial transport medium  Result Value Ref Range Status   SARS Coronavirus 2 by RT PCR NEGATIVE NEGATIVE Final    Comment: (NOTE) SARS-CoV-2 target nucleic acids are NOT DETECTED.  The SARS-CoV-2 RNA is generally detectable in upper respiratory specimens during the acute phase of infection. The lowest concentration of SARS-CoV-2 viral copies this assay can detect is 138 copies/mL. A negative result does not preclude SARS-Cov-2 infection and should not be used as the sole basis for treatment or other patient management decisions. A negative result may occur with  improper specimen collection/handling, submission of specimen other than nasopharyngeal swab, presence of viral mutation(s) within the areas targeted by this assay, and inadequate number of viral copies(<138 copies/mL). A negative result must be combined with clinical observations, patient history, and epidemiological information. The expected result is Negative.  Fact Sheet for Patients:  EntrepreneurPulse.com.au  Fact Sheet for Healthcare Providers:   IncredibleEmployment.be  This test is no t yet approved or cleared by the [place] FDA and  has been authorized for detection and/or diagnosis of SARS-CoV-2 by FDA under an Emergency Use Authorization (EUA). This EUA will remain  in effect (meaning this test can be used) for the duration of the COVID-19 declaration under Section 564(b)(1) of the Act, 21 U.S.C.section 360bbb-3(b)(1), unless the authorization is terminated  or revoked sooner.       Influenza A by PCR NEGATIVE NEGATIVE Final   Influenza B by PCR NEGATIVE NEGATIVE Final    Comment: (NOTE) The Xpert Xpress SARS-CoV-2/FLU/RSV plus assay is intended as an aid in the diagnosis of influenza from Nasopharyngeal swab specimens and should not be used as a sole basis for treatment. Nasal washings and aspirates are unacceptable for Xpert Xpress SARS-CoV-2/FLU/RSV testing.  Fact Sheet for Patients: EntrepreneurPulse.com.au  Fact Sheet for Healthcare Providers: IncredibleEmployment.be  This test is not yet approved or cleared by the [place] FDA and has been authorized for detection and/or diagnosis of SARS-CoV-2 by FDA under an Emergency Use Authorization (EUA). This EUA will remain in effect (meaning this test can be used) for the duration of the COVID-19 declaration under Section 564(b)(1)  of the Act, 21 U.S.C. section 360bbb-3(b)(1), unless the authorization is terminated or revoked.  Performed at [facility], [address] [place], [place] [address]   Aerobic/Anaerobic Culture w Gram Stain (surgical/deep wound)     Status: None (Preliminary result)   Collection Time: 06/03/21  3:56 PM   Specimen: PATH Other; Wound  Result Value Ref Range Status   Specimen Description   Final    FINGER RIGHT INDEX FINGER WOUND Performed at [facility], [address], [place], [place] [address]    Special Requests   Final    NONE Performed at  [facility], [place], [place] [address]    Gram Stain   Final    FEW SQUAMOUS EPITHELIAL CELLS PRESENT FEW WBC PRESENT, PREDOMINANTLY PMN NO ORGANISMS SEEN    Culture   Final    TOO YOUNG TO READ Performed at [facility], [place] [address], [place], [place] [address]    Report Status PENDING  Incomplete         Radiology Studies: DG Finger Index Right  Result Date: 06/03/2021 CLINICAL DATA:  Dog bite EXAM: RIGHT INDEX FINGER 2+V COMPARISON:  None. FINDINGS: No evidence of acute fracture. No dislocation. Degenerative changes are visualized at the first [facility] joint. Soft tissue swelling of the index finger most pronounced at the level of the PIP joint. No radiopaque foreign body identified within the soft tissues. IMPRESSION: Soft tissue swelling without acute fracture or dislocation. No radiopaque foreign body identified within the soft tissues. Electronically Signed   By: Davina Poke D.O.   On: 06/03/2021 12:02        Scheduled Meds:  acetaminophen  1,000 mg Oral Q6H   apixaban  2.5 mg Oral BID   docusate sodium  100 mg Oral BID   ketorolac  7.5 mg Intravenous Q6H   traMADol  50 mg Oral Q6H   Continuous Infusions:  sodium chloride Stopped (06/03/21 1244)   sodium chloride 75 mL/hr at 06/03/21 1924   doxycycline (VIBRAMYCIN) IV 100 mg (06/04/21 0010)   metronidazole 500 mg (06/04/21 0844)          Aline August, MD [facility] 06/04/2021, 10:13 AM

## 2021-06-04 NOTE — Plan of Care (Signed)

## 2021-06-04 NOTE — TOC Progression Note (Signed)
Transition of Care [facility]) - Progression Note    Patient Details  Name: Dana Bautista MRN: [id number] Date of Birth: 08-25-1946  Transition of Care [facility]) CM/SW [place], RN Phone Number: 06/04/2021, 11:18 AM  Clinical Narrative:     The patient is from home, No Obvious needs, she is independent at home, Please notify or Consult TOC with any needs       Expected Discharge Plan and Services                                                 Social Determinants of Health (SDOH) Interventions    Readmission Risk Interventions No flowsheet data found.

## 2021-06-04 NOTE — Progress Notes (Signed)
   06/04/21 1408  Provider Notification  Provider Name/Title Theda Belfast, MD  Date Provider Notified 06/04/21  Time Provider Notified 1408  Notification Type Page (via secure message)  Notification Reason Other (Comment) (c/o headache with nausea)  Provider response No new orders  Date of Provider Response 06/04/21  Time of Provider Response 1413  Patient states Migraines in past but it has been "years."  MD notified regarding patient concern about NSAID's with elequis.  MD ok with a few doses of NSAID's.

## 2021-06-05 LAB — C-REACTIVE PROTEIN: CRP: 1 mg/dL — ABNORMAL HIGH (ref <1.0)

## 2021-06-05 LAB — CBC WITH DIFFERENTIAL/PLATELET
Abs Immature Granulocytes: 0.01 10*3/uL (ref 0.00–0.07)
Basophils Absolute: 0 10*3/uL (ref 0.0–0.1)
Basophils Relative: 0 %
Eosinophils Absolute: 0 10*3/uL (ref 0.0–0.5)
Eosinophils Relative: 1 %
HCT: 33.1 % — ABNORMAL LOW (ref 36.0–46.0)
Hemoglobin: 10.6 g/dL — ABNORMAL LOW (ref 12.0–15.0)
Immature Granulocytes: 0 %
Lymphocytes Relative: 32 %
Lymphs Abs: 2.2 10*3/uL (ref 0.7–4.0)
MCH: 28.5 pg (ref 26.0–34.0)
MCHC: 32 g/dL (ref 30.0–36.0)
MCV: 89 fL (ref 80.0–100.0)
Monocytes Absolute: 0.5 10*3/uL (ref 0.1–1.0)
Monocytes Relative: 7 %
Neutro Abs: 4.1 10*3/uL (ref 1.7–7.7)
Neutrophils Relative %: 60 %
Platelets: 204 10*3/uL (ref 150–400)
RBC: 3.72 MIL/uL — ABNORMAL LOW (ref 3.87–5.11)
RDW: 13.2 % (ref 11.5–15.5)
WBC: 6.7 10*3/uL (ref 4.0–10.5)
nRBC: 0 % (ref 0.0–0.2)

## 2021-06-05 LAB — BASIC METABOLIC PANEL
Anion gap: 5 (ref 5–15)
BUN: 13 mg/dL (ref 8–23)
CO2: 25 mmol/L (ref 22–32)
Calcium: 8.5 mg/dL — ABNORMAL LOW (ref 8.9–10.3)
Chloride: 107 mmol/L (ref 98–111)
Creatinine, Ser: 0.6 mg/dL (ref 0.44–1.00)
GFR, Estimated: >60 mL/min (ref >60)
Glucose, Bld: 93 mg/dL (ref 70–99)
Potassium: 3.8 mmol/L (ref 3.5–5.1)
Sodium: 137 mmol/L (ref 135–145)

## 2021-06-05 LAB — MAGNESIUM: Magnesium: 2.1 mg/dL (ref 1.7–2.4)

## 2021-06-05 LAB — GLUCOSE, CAPILLARY: Glucose-Capillary: 97 mg/dL (ref 70–99)

## 2021-06-05 MED ORDER — VANCOMYCIN HCL 1500 MG/300ML IV SOLN
1500.0000 mg | INTRAVENOUS | Status: DC
  Filled 2021-06-05: qty 300

## 2021-06-05 MED ORDER — METRONIDAZOLE 500 MG PO TABS
500.0000 mg | ORAL_TABLET | Freq: Two times a day (BID) | ORAL | Status: DC
  Administered 2021-06-05: 500 mg via ORAL
  Filled 2021-06-05 (×2): qty 1

## 2021-06-05 MED ORDER — VANCOMYCIN HCL 2000 MG/400ML IV SOLN
2000.0000 mg | Freq: Once | INTRAVENOUS | Status: AC
  Administered 2021-06-05: 2000 mg via INTRAVENOUS
  Filled 2021-06-05: qty 400

## 2021-06-05 MED ORDER — PANTOPRAZOLE SODIUM 40 MG IV SOLR
40.0000 mg | Freq: Every day | INTRAVENOUS | Status: DC
  Administered 2021-06-05 – 2021-06-06 (×2): 40 mg via INTRAVENOUS
  Filled 2021-06-05 (×2): qty 40

## 2021-06-05 MED ORDER — DOXYCYCLINE HYCLATE 100 MG PO TABS: 100.0000 mg | ORAL_TABLET | Freq: Two times a day (BID) | ORAL | Status: DC

## 2021-06-05 MED ORDER — TRAMADOL HCL 50 MG PO TABS
50.0000 mg | ORAL_TABLET | Freq: Four times a day (QID) | ORAL | Status: DC | PRN
Start: 2021-06-05 — End: 2021-06-06

## 2021-06-05 NOTE — Consult Note (Addendum)
Pharmacy Antibiotic Note  Dana Bautista is a 75 y.o. female admitted on 06/03/2021 with cellulitis.  Pharmacy has been consulted for vancomycin dosing.  Patient has been persistently nauseous with intermittent vomiting: Might be due to antibiotic use as well.  DC doxycycline.  Start vancomycin.  She has allergies to so many antibiotics and so would not use penicillins/Levaquin/sulfa. Plan: 09/10 vancomycin 2gm LD           vanco '1500mg'$  q24hrs   Scr daily/ levels to be done '@steady'$  state  AUC=588.3  Cmin=13.0, Cmax=43.6   Height: '5\' 6"'$  (167.6 cm) Weight: 94.8 kg (209 lb) IBW/kg (Calculated) : 59.3  Temp (24hrs), Avg:98.1 F (36.7 C), Min:97.6 F (36.4 C), Max:98.5 F (36.9 C)  Recent Labs  Lab 06/03/21 1120 06/04/21 0428 06/05/21 0423  WBC 9.5 6.3 6.7  CREATININE 0.55 0.54 0.60    Estimated Creatinine Clearance: 73.8 mL/min (by C-G formula based on SCr of 0.6 mg/dL).    Allergies  Allergen Reactions   Codeine Nausea And Vomiting   Elemental Sulfur Other (See Comments)    Lip swelling, difficulty breathing   Levaquin [Levofloxacin] Hives   Penicillins Hives and Other (See Comments)    Did it involve swelling of the face/tongue/throat, SOB, or low BP? Unknown Did it involve sudden or severe rash/hives, skin peeling, or any reaction on the inside of your mouth or nose? Yes Did you need to seek medical attention at a hospital or doctor's office? Yes When did it last happen? many years If all above answers are "NO", may proceed with cephalosporin use.    Sulfa Antibiotics     Other reaction(s): lip swelling   Latex Rash    Antimicrobials this admission: 09/08 >> 09/10  Doxycycline '100mg'$  IV 09/08  >> 09/10 metronidazole 500gm IV 09/08 vancomycin 2gm pre-op 09/10  metronidazole '500mg'$  PO>>   Microbiology results: SAR virus-negative  Thank you for allowing pharmacy to be a part of this patient's care.  Berta Minor, RPh 06/05/2021 10:16 AM

## 2021-06-05 NOTE — Progress Notes (Signed)
Patient ID: Dana Bautista, female   DOB: [birth year], 75 y.o.   MRN: [id number]  PROGRESS NOTE    Dana Bautista  [id number] DOB: Oct 31, 1945 DOA: 06/03/2021 PCP: Marinda Elk, MD   Brief Narrative:  75 y.o. female with medical history significant of PE and DVT on Eliquis, mitral valve regurgitation, skin cancer, anemia, pleural effusion, pericardial effusion presents with right hand pain after dog bite.  On presentation x-ray of right finger was negative for bony fracture or foreign bodies.  She was started on IV antibiotics.  Orthopedics/Dr. Roland Rack was consulted.  She underwent I&D on 06/03/2021.  Assessment & Plan:   Right hand cellulitis/flexor tenosynovitis of right index finger status post dog bite -Status post I&D on 06/03/2021.  Follow wound cultures and further recommendations from orthopedics/Dr. Poggi.  Wound care as per orthopedics. -Currently on doxycycline and Flagyl.  Patient has been persistently nauseous with intermittent vomiting: Might be due to antibiotic use as well.  DC doxycycline.  Start vancomycin.  She has allergies to so many antibiotics and so would not use penicillins/Levaquin/sulfa.  History of DVT and PE -Continue Eliquis  Chronic normocytic anemia -Questionable cause.  Monitor  Mild hyponatremia -Monitor   DVT prophylaxis: Eliquis Code Status: Full Family Communication: Husband at bedside  disposition Plan: Status is: Inpatient because:  Inpatient level of care appropriate due to severity of illness  Dispo: The patient is from: Home              Anticipated d/c is to: Home              Patient currently is not medically stable to d/c.   Difficult to place patient No  Consultants: Orthopedics  Procedures: I&D of right finger on 06/03/2021  Antimicrobials: Doxycycline and Flagyl from 06/03/2021 onwards   Subjective: Patient seen and examined at bedside.  No overnight fever reported.  Still complains of intermittent nausea with vomiting.  Denies  worsening abdominal pain or diarrhea. Objective: Vitals:   06/04/21 2112 06/05/21 0334 06/05/21 0334 06/05/21 0731  BP: (!) 111/57 128/63 128/63 133/64  Pulse: 69 69 75 75  Resp: '17 18 18 16  '$ Temp: 98.3 F (36.8 C) 97.6 F (36.4 C) 97.6 F (36.4 C) 98.4 F (36.9 C)  TempSrc:      SpO2: 99% 98% 98% 100%  Weight:      Height:        Intake/Output Summary (Last 24 hours) at 06/05/2021 1002 Last data filed at 06/04/2021 1022 Gross per 24 hour  Intake 240 ml  Output --  Net 240 ml    Filed Weights   06/03/21 1015 06/03/21 1336  Weight: 94.8 kg 94.8 kg    Examination:  General exam: No distress.  On room air currently.   Respiratory system: Decreased breath sounds at bases bilaterally cardiovascular system: Rate controlled, S1-S2 heard gastrointestinal system: Abdomen is slightly distended, soft and nontender.  Bowel sounds are heard Extremities: Dressing present in the right index finger  Central nervous system: Awake and alert.  No focal neurological deficits.  Moves extremities.   Skin: No obvious ecchymosis/other lesions  psychiatry: Affect is mostly flat.   Data Reviewed: I have personally reviewed following labs and imaging studies  CBC: Recent Labs  Lab 06/03/21 1120 06/04/21 0428 06/05/21 0423  WBC 9.5 6.3 6.7  NEUTROABS 6.4  --  4.1  HGB 11.9* 10.5* 10.6*  HCT 36.7 32.5* 33.1*  MCV 86.4 86.4 89.0  PLT 260 221 204    Basic  Metabolic Panel: Recent Labs  Lab 06/03/21 1120 06/04/21 0428 06/05/21 0423  NA 135 134* 137  K 4.0 4.5 3.8  CL 102 105 107  CO2 '27 24 25  '$ GLUCOSE 104* 119* 93  BUN '10 12 13  '$ CREATININE 0.55 0.54 0.60  CALCIUM 9.2 8.3* 8.5*  MG  --   --  2.1    GFR: Estimated Creatinine Clearance: 73.8 mL/min (by C-G formula based on SCr of 0.6 mg/dL). Liver Function Tests: No results for input(s): AST, ALT, ALKPHOS, BILITOT, PROT, ALBUMIN in the last 168 hours. No results for input(s): LIPASE, AMYLASE in the last 168 hours. No results  for input(s): AMMONIA in the last 168 hours. Coagulation Profile: No results for input(s): INR, PROTIME in the last 168 hours. Cardiac Enzymes: No results for input(s): CKTOTAL, CKMB, CKMBINDEX, TROPONINI in the last 168 hours. BNP (last 3 results) No results for input(s): PROBNP in the last 8760 hours. HbA1C: No results for input(s): HGBA1C in the last 72 hours. CBG: Recent Labs  Lab 06/05/21 0818  GLUCAP 97   Lipid Profile: No results for input(s): CHOL, HDL, LDLCALC, TRIG, CHOLHDL, LDLDIRECT in the last 72 hours. Thyroid Function Tests: No results for input(s): TSH, T4TOTAL, FREET4, T3FREE, THYROIDAB in the last 72 hours. Anemia Panel: No results for input(s): VITAMINB12, FOLATE, FERRITIN, TIBC, IRON, RETICCTPCT in the last 72 hours. Sepsis Labs: No results for input(s): PROCALCITON, LATICACIDVEN in the last 168 hours.  Recent Results (from the past 240 hour(s))  Resp Panel by RT-PCR (Flu A&B, Covid) Nasopharyngeal Swab     Status: None   Collection Time: 06/03/21 11:20 AM   Specimen: Nasopharyngeal Swab; Nasopharyngeal(NP) swabs in vial transport medium  Result Value Ref Range Status   SARS Coronavirus 2 by RT PCR NEGATIVE NEGATIVE Final    Comment: (NOTE) SARS-CoV-2 target nucleic acids are NOT DETECTED.  The SARS-CoV-2 RNA is generally detectable in upper respiratory specimens during the acute phase of infection. The lowest concentration of SARS-CoV-2 viral copies this assay can detect is 138 copies/mL. A negative result does not preclude SARS-Cov-2 infection and should not be used as the sole basis for treatment or other patient management decisions. A negative result may occur with  improper specimen collection/handling, submission of specimen other than nasopharyngeal swab, presence of viral mutation(s) within the areas targeted by this assay, and inadequate number of viral copies(<138 copies/mL). A negative result must be combined with clinical observations,  patient history, and epidemiological information. The expected result is Negative.  Fact Sheet for Patients:  EntrepreneurPulse.com.au  Fact Sheet for Healthcare Providers:  IncredibleEmployment.be  This test is no t yet approved or cleared by the [place] FDA and  has been authorized for detection and/or diagnosis of SARS-CoV-2 by FDA under an Emergency Use Authorization (EUA). This EUA will remain  in effect (meaning this test can be used) for the duration of the COVID-19 declaration under Section 564(b)(1) of the Act, 21 U.S.C.section 360bbb-3(b)(1), unless the authorization is terminated  or revoked sooner.       Influenza A by PCR NEGATIVE NEGATIVE Final   Influenza B by PCR NEGATIVE NEGATIVE Final    Comment: (NOTE) The Xpert Xpress SARS-CoV-2/FLU/RSV plus assay is intended as an aid in the diagnosis of influenza from Nasopharyngeal swab specimens and should not be used as a sole basis for treatment. Nasal washings and aspirates are unacceptable for Xpert Xpress SARS-CoV-2/FLU/RSV testing.  Fact Sheet for Patients: EntrepreneurPulse.com.au  Fact Sheet for Healthcare Providers: IncredibleEmployment.be  This  test is not yet approved or cleared by the [place] and has been authorized for detection and/or diagnosis of SARS-CoV-2 by FDA under an Emergency Use Authorization (EUA). This EUA will remain in effect (meaning this test can be used) for the duration of the COVID-19 declaration under Section 564(b)(1) of the Act, 21 U.S.C. section 360bbb-3(b)(1), unless the authorization is terminated or revoked.  Performed at [facility], [address] [place], [place] [address]   Aerobic/Anaerobic Culture w Gram Stain (surgical/deep wound)     Status: None (Preliminary result)   Collection Time: 06/03/21  3:56 PM   Specimen: PATH Other; Wound  Result Value Ref Range Status    Specimen Description   Final    FINGER RIGHT INDEX FINGER WOUND Performed at [facility], [address] [place], [place] [address]    Special Requests   Final    NONE Performed at [facility], [place], [place] [address]    Gram Stain   Final    FEW SQUAMOUS EPITHELIAL CELLS PRESENT FEW WBC PRESENT, PREDOMINANTLY PMN NO ORGANISMS SEEN    Culture   Final    TOO YOUNG TO READ Performed at [facility], [place] [address], [place], [place] [address]    Report Status PENDING  Incomplete          Radiology Studies: DG Finger Index Right  Result Date: 06/03/2021 CLINICAL DATA:  Dog bite EXAM: RIGHT INDEX FINGER 2+V COMPARISON:  None. FINDINGS: No evidence of acute fracture. No dislocation. Degenerative changes are visualized at the first [facility] joint. Soft tissue swelling of the index finger most pronounced at the level of the PIP joint. No radiopaque foreign body identified within the soft tissues. IMPRESSION: Soft tissue swelling without acute fracture or dislocation. No radiopaque foreign body identified within the soft tissues. Electronically Signed   By: Davina Poke D.O.   On: 06/03/2021 12:02        Scheduled Meds:  apixaban  2.5 mg Oral BID   docusate sodium  100 mg Oral BID   doxycycline  100 mg Oral Q12H   metroNIDAZOLE  500 mg Oral Q12H   traMADol  50 mg Oral Q6H   Continuous Infusions:  sodium chloride 75 mL/hr at 06/05/21 0550          Aline August, MD [facility] 06/05/2021, 10:02 AM

## 2021-06-05 NOTE — Plan of Care (Signed)

## 2021-06-05 NOTE — Plan of Care (Signed)
Patient continues to have ongoing nausea and vomiting, denies pain with assess and refused scheduled morning pain medication.  Continues to receive IV antibiotics. Appeared to be anxious and paranoid as the shift progressed, patient denied feelings of anxiety. Frequent assistance to bathroom. Will continue to monitor.  Problem: Education: Goal: Knowledge of General Education information will improve Description: Including pain rating scale, medication(s)/side effects and non-pharmacologic comfort measures Outcome: Progressing   Problem: Health Behavior/Discharge Planning: Goal: Ability to manage health-related needs will improve Outcome: Progressing   Problem: Clinical Measurements: Goal: Ability to maintain clinical measurements within normal limits will improve Outcome: Progressing Goal: Will remain free from infection Outcome: Progressing Goal: Diagnostic test results will improve Outcome: Progressing Goal: Respiratory complications will improve Outcome: Progressing Goal: Cardiovascular complication will be avoided Outcome: Progressing   Problem: Activity: Goal: Risk for activity intolerance will decrease Outcome: Progressing   Problem: Nutrition: Goal: Adequate nutrition will be maintained Outcome: Progressing   Problem: Coping: Goal: Level of anxiety will decrease Outcome: Progressing   Problem: Elimination: Goal: Will not experience complications related to bowel motility Outcome: Progressing Goal: Will not experience complications related to urinary retention Outcome: Progressing   Problem: Pain Managment: Goal: General experience of comfort will improve Outcome: Progressing

## 2021-06-05 NOTE — Progress Notes (Signed)
PHARMACIST - PHYSICIAN COMMUNICATION  CONCERNING: Antibiotic IV to Oral Route Change Policy  RECOMMENDATION: This patient is receiving doxycycline & metronidazole by the intravenous route.  Based on criteria approved by the Pharmacy and Therapeutics Committee, the antibiotic(s) is/are being converted to the equivalent oral dose form(s).   DESCRIPTION: These criteria include: Patient being treated for a respiratory tract infection, urinary tract infection, cellulitis or clostridium difficile associated diarrhea if on metronidazole The patient is not neutropenic and does not exhibit a GI malabsorption state The patient is eating (either orally or via tube) and/or has been taking other orally administered medications for a least 24 hours The patient is improving clinically and has a Tmax < 100.5  If you have questions about this conversion, please contact the [place]  06/05/21

## 2021-06-05 NOTE — Progress Notes (Signed)
  Subjective: 2 Days Post-Op Procedure(s) (LRB): INCISION AND DRAINAGE-Right Index Finger (Right) Patient reports pain as moderate.   Patient is well, but has had some minor complaints of nausea and vomiting overnight with dry heaving this AM Negative for chest pain and shortness of breath Fever: no Gastrointestinal: positive for nausea and vomiting.   Objective: Vital signs in last 24 hours: Temp:  [97.6 F (36.4 C)-98.5 F (36.9 C)] 98.4 F (36.9 C) (09/10 0731) Pulse Rate:  [58-75] 75 (09/10 0731) Resp:  [16-18] 16 (09/10 0731) BP: (111-133)/(53-64) 133/64 (09/10 0731) SpO2:  [97 %-100 %] 100 % (09/10 0731)  Intake/Output from previous day: No intake or output data in the 24 hours ending 06/05/21 1149  Intake/Output this shift: No intake/output data recorded.  Labs: Recent Labs    06/03/21 1120 06/04/21 0428 06/05/21 0423  HGB 11.9* 10.5* 10.6*   Recent Labs    06/04/21 0428 06/05/21 0423  WBC 6.3 6.7  RBC 3.76* 3.72*  HCT 32.5* 33.1*  PLT 221 204   Recent Labs    06/04/21 0428 06/05/21 0423  NA 134* 137  K 4.5 3.8  CL 105 107  CO2 24 25  BUN 12 13  CREATININE 0.54 0.60  GLUCOSE 119* 93  CALCIUM 8.3* 8.5*   No results for input(s): LABPT, INR in the last 72 hours.   EXAM General - Patient is Alert, Appropriate, and Oriented Extremity - Neurovascular intact Dressing/Incision - bulky dry dressing intact, after removal, wound is clean, dry, no drainage   Assessment/Plan: 2 Days Post-Op Procedure(s) (LRB): INCISION AND DRAINAGE-Right Index Finger (Right) Principal Problem:   Cellulitis of right hand Active Problems:   History of deep vein thrombosis (DVT) of lower extremity   History of pulmonary embolism   Dog bite of right hand   Normocytic anemia   Flexor tenosynovitis of finger   Cellulitis  Estimated body mass index is 33.73 kg/m as calculated from the following:   Height as of this encounter: '5\' 6"'$  (1.676 m).   Weight as of this  encounter: 94.8 kg. Advance diet Up with therapy  IV abx changed to PO.  Continue to monitor cultures, no results yet  Dry dressing changed.      DVT Prophylaxis - Eliquis   Cassell Smiles, PA-C [facility] Orthopaedic Surgery 06/05/2021, 11:49 AM

## 2021-06-06 LAB — BASIC METABOLIC PANEL
Anion gap: 9 (ref 5–15)
BUN: 10 mg/dL (ref 8–23)
CO2: 24 mmol/L (ref 22–32)
Calcium: 8.8 mg/dL — ABNORMAL LOW (ref 8.9–10.3)
Chloride: 103 mmol/L (ref 98–111)
Creatinine, Ser: 0.55 mg/dL (ref 0.44–1.00)
GFR, Estimated: >60 mL/min (ref >60)
Glucose, Bld: 96 mg/dL (ref 70–99)
Potassium: 3.6 mmol/L (ref 3.5–5.1)
Sodium: 136 mmol/L (ref 135–145)

## 2021-06-06 LAB — CBC WITH DIFFERENTIAL/PLATELET
Abs Immature Granulocytes: 0.02 10*3/uL (ref 0.00–0.07)
Basophils Absolute: 0 10*3/uL (ref 0.0–0.1)
Basophils Relative: 0 %
Eosinophils Absolute: 0 10*3/uL (ref 0.0–0.5)
Eosinophils Relative: 0 %
HCT: 33.6 % — ABNORMAL LOW (ref 36.0–46.0)
Hemoglobin: 11.4 g/dL — ABNORMAL LOW (ref 12.0–15.0)
Immature Granulocytes: 0 %
Lymphocytes Relative: 29 %
Lymphs Abs: 2.1 10*3/uL (ref 0.7–4.0)
MCH: 29.2 pg (ref 26.0–34.0)
MCHC: 33.9 g/dL (ref 30.0–36.0)
MCV: 85.9 fL (ref 80.0–100.0)
Monocytes Absolute: 0.6 10*3/uL (ref 0.1–1.0)
Monocytes Relative: 8 %
Neutro Abs: 4.6 10*3/uL (ref 1.7–7.7)
Neutrophils Relative %: 63 %
Platelets: 245 10*3/uL (ref 150–400)
RBC: 3.91 MIL/uL (ref 3.87–5.11)
RDW: 12.9 % (ref 11.5–15.5)
WBC: 7.3 10*3/uL (ref 4.0–10.5)
nRBC: 0 % (ref 0.0–0.2)

## 2021-06-06 LAB — C-REACTIVE PROTEIN: CRP: 0.9 mg/dL (ref <1.0)

## 2021-06-06 LAB — GLUCOSE, CAPILLARY: Glucose-Capillary: 96 mg/dL (ref 70–99)

## 2021-06-06 LAB — MAGNESIUM: Magnesium: 1.8 mg/dL (ref 1.7–2.4)

## 2021-06-06 MED ORDER — ONDANSETRON HCL 4 MG PO TABS
4.0000 mg | ORAL_TABLET | Freq: Four times a day (QID) | ORAL | 0 refills | Status: DC | PRN
Start: 2021-06-06 — End: 2022-05-16

## 2021-06-06 MED ORDER — DOXYCYCLINE HYCLATE 100 MG PO TABS: 100.0000 mg | ORAL_TABLET | Freq: Two times a day (BID) | ORAL | 0 refills | Status: DC

## 2021-06-06 MED ORDER — PANTOPRAZOLE SODIUM 40 MG PO TBEC: 40.0000 mg | DELAYED_RELEASE_TABLET | Freq: Every day | ORAL | Status: DC

## 2021-06-06 MED ORDER — PANTOPRAZOLE SODIUM 40 MG PO TBEC: 40.0000 mg | DELAYED_RELEASE_TABLET | Freq: Every day | ORAL | 0 refills | Status: DC

## 2021-06-06 MED ORDER — TRAMADOL HCL 50 MG PO TABS: 50.0000 mg | ORAL_TABLET | Freq: Four times a day (QID) | ORAL | 0 refills | Status: DC | PRN

## 2021-06-06 MED ORDER — ONDANSETRON HCL 4 MG PO TABS: 4.0000 mg | ORAL_TABLET | Freq: Four times a day (QID) | ORAL | 0 refills | Status: DC | PRN

## 2021-06-06 MED ORDER — DOXYCYCLINE HYCLATE 100 MG PO TABS
100.0000 mg | ORAL_TABLET | Freq: Two times a day (BID) | ORAL | Status: DC
  Administered 2021-06-06: 100 mg via ORAL
  Filled 2021-06-06: qty 1

## 2021-06-06 NOTE — Discharge Summary (Signed)
Physician Discharge Summary  Dana Bautista [id number] DOB: Jul 14, 1946 DOA: 06/03/2021  PCP: Marinda Elk, MD  Admit date: 06/03/2021 Discharge date: 06/06/2021  Admitted From: Home Disposition: Home  Recommendations for Outpatient Follow-up:  Follow up with PCP in 1 week with repeat CBC/BMP Outpatient follow-up with orthopedics.  Wound care as per orthopedics recommendations Follow up in ED if symptoms worsen or new appear   Home Health: No Equipment/Devices: None  Discharge Condition: Stable CODE STATUS: Full Diet recommendation: Heart healthy  Brief/Interim Summary: 75 y.o. female with medical history significant of PE and DVT on Eliquis, mitral valve regurgitation, skin cancer, anemia, pleural effusion, pericardial effusion presents with right hand pain after dog bite.  On presentation x-ray of right finger was negative for bony fracture or foreign bodies.  She was started on IV antibiotics.  Orthopedics/Dr. Roland Rack was consulted.  She underwent I&D on 06/03/2021.  During the hospitalization, she had issues with nausea and vomiting which gradually are improving.  She is still intermittently nauseous but tolerating diet.  Wound cultures are growing Pasteurella.  Orthopedics has cleared the patient for discharge.  She will be discharged on oral doxycycline for 7 days with outpatient follow-up with orthopedics.  Wound care as per orthopedics recommendations.  Discharge Diagnoses:   Right hand cellulitis/flexor tenosynovitis of right index finger status post dog bite -Status post I&D on 06/03/2021.  Follow wound cultures and further recommendations from orthopedics/Dr. Poggi.  Wound care as per orthopedics. -Currently on vancomycin and Flagyl; doxycycline was switched to IV vancomycin because of nausea and vomiting.  - nausea and vomiting are gradually improving.  She is still intermittently nauseous but tolerating diet.  -She has allergies to so many antibiotics and so would not use  penicillins/Levaquin/sulfa.   -Wound cultures are growing Pasteurella.  Orthopedics has cleared the patient for discharge.  She will be discharged on oral doxycycline for 7 days with outpatient follow-up with orthopedics.  Wound care as per orthopedics recommendations. -Continue short-term Protonix on discharge.  History of DVT and PE -Continue Eliquis   Chronic normocytic anemia -Questionable cause.  Outpatient follow-up  Mild hyponatremia -Resolved.  Discharge Instructions   Allergies as of 06/06/2021       Reactions   Codeine Nausea And Vomiting   Elemental Sulfur Other (See Comments)   Lip swelling, difficulty breathing   Levaquin [levofloxacin] Hives   Penicillins Hives, Other (See Comments)   Did it involve swelling of the face/tongue/throat, SOB, or low BP? Unknown Did it involve sudden or severe rash/hives, skin peeling, or any reaction on the inside of your mouth or nose? Yes Did you need to seek medical attention at a hospital or doctor's office? Yes When did it last happen? many years If all above answers are "NO", may proceed with cephalosporin use.   Sulfa Antibiotics    Other reaction(s): lip swelling   Latex Rash        Medication List     STOP taking these medications    ascorbic acid 500 MG tablet Commonly known as: VITAMIN C   calcium carbonate 1500 (600 Ca) MG Tabs tablet Commonly known as: OSCAL   clindamycin 150 MG capsule Commonly known as: CLEOCIN   fluticasone 50 MCG/ACT nasal spray Commonly known as: FLONASE   HYDROcodone-acetaminophen 5-325 MG tablet Commonly known as: NORCO/VICODIN   lubiprostone 24 MCG capsule Commonly known as: AMITIZA   MAGNESIUM-POTASSIUM PO   ondansetron 4 MG disintegrating tablet Commonly known as: Zofran ODT   triamcinolone cream 0.1 %  Commonly known as: KENALOG   VITAMIN D (CHOLECALCIFEROL) PO   Vitamin D (Ergocalciferol) 1.25 MG (50000 UNIT) Caps capsule Commonly known as: DRISDOL       TAKE  these medications    apixaban 2.5 MG Tabs tablet Commonly known as: Eliquis Take 1 tablet (2.5 mg total) by mouth 2 (two) times daily.   cetirizine 10 MG tablet Commonly known as: ZYRTEC   cyanocobalamin 1000 MCG/ML injection Commonly known as: (VITAMIN B-12) Inject 1,000 mcg into the muscle every 30 (thirty) days.   diclofenac Sodium 1 % Gel Commonly known as: VOLTAREN   doxycycline 100 MG tablet Commonly known as: VIBRA-TABS Take 1 tablet (100 mg total) by mouth every 12 (twelve) hours.   ondansetron 4 MG tablet Commonly known as: ZOFRAN Take 1 tablet (4 mg total) by mouth every 6 (six) hours as needed for nausea.   pantoprazole 40 MG tablet Commonly known as: PROTONIX Take 1 tablet (40 mg total) by mouth daily. Start taking on: June 07, 2021   traMADol 50 MG tablet Commonly known as: ULTRAM Take 1 tablet (50 mg total) by mouth every 6 (six) hours as needed for moderate pain.               Discharge Care Instructions  (From admission, onward)           Start     Ordered   06/06/21 0000  Discharge wound care:       Comments: As per orthopedics recommendations   06/06/21 1255             Follow-up Information     Marinda Elk, MD. Schedule an appointment as soon as possible for a visit in 1 week(s).   Specialty: Physician Assistant Contact information: [place] [address] [phone]         Poggi, Marshall Cork, MD. Schedule an appointment as soon as possible for a visit in 1 week(s).   Specialty: Orthopedic Surgery Contact information: [place] [address] [phone]                Allergies  Allergen Reactions   Codeine Nausea And Vomiting   Elemental Sulfur Other (See Comments)    Lip swelling, difficulty breathing   Levaquin [Levofloxacin] Hives   Penicillins Hives and Other (See Comments)    Did it involve swelling of the  face/tongue/throat, SOB, or low BP? Unknown Did it involve sudden or severe rash/hives, skin peeling, or any reaction on the inside of your mouth or nose? Yes Did you need to seek medical attention at a hospital or doctor's office? Yes When did it last happen? many years If all above answers are "NO", may proceed with cephalosporin use.    Sulfa Antibiotics     Other reaction(s): lip swelling   Latex Rash    Consultations: Orthopedics.   Procedures/Studies: DG Finger Index Right  Result Date: 06/03/2021 CLINICAL DATA:  Dog bite EXAM: RIGHT INDEX FINGER 2+V COMPARISON:  None. FINDINGS: No evidence of acute fracture. No dislocation. Degenerative changes are visualized at the first [facility] joint. Soft tissue swelling of the index finger most pronounced at the level of the PIP joint. No radiopaque foreign body identified within the soft tissues. IMPRESSION: Soft tissue swelling without acute fracture or dislocation. No radiopaque foreign body identified within the soft tissues. Electronically Signed   By: Davina Poke D.O.   On: 06/03/2021 12:02  Subjective: Patient seen and examined at bedside.  She feels better and wants to go home today.  Still intermittently nauseous but tolerating diet.  No overnight fever, chest pain or shortness of breath reported.  Discharge Exam: Vitals:   06/06/21 0434 06/06/21 0741  BP: 140/67 134/67  Pulse: 74 80  Resp: 18 15  Temp: 98.2 F (36.8 C) 98.7 F (37.1 C)  SpO2: 98% 97%    General: Pt is alert, awake, not in acute distress.  Currently on room air. Cardiovascular: rate controlled, S1/S2 + Respiratory: bilateral decreased breath sounds at bases Abdominal: Soft, NT, ND, bowel sounds + Extremities: Right index finger dressing present.   The results of significant diagnostics from this hospitalization (including imaging, microbiology, ancillary and laboratory) are listed below for reference.     Microbiology: Recent Results (from the  past 240 hour(s))  Resp Panel by RT-PCR (Flu A&B, Covid) Nasopharyngeal Swab     Status: None   Collection Time: 06/03/21 11:20 AM   Specimen: Nasopharyngeal Swab; Nasopharyngeal(NP) swabs in vial transport medium  Result Value Ref Range Status   SARS Coronavirus 2 by RT PCR NEGATIVE NEGATIVE Final    Comment: (NOTE) SARS-CoV-2 target nucleic acids are NOT DETECTED.  The SARS-CoV-2 RNA is generally detectable in upper respiratory specimens during the acute phase of infection. The lowest concentration of SARS-CoV-2 viral copies this assay can detect is 138 copies/mL. A negative result does not preclude SARS-Cov-2 infection and should not be used as the sole basis for treatment or other patient management decisions. A negative result may occur with  improper specimen collection/handling, submission of specimen other than nasopharyngeal swab, presence of viral mutation(s) within the areas targeted by this assay, and inadequate number of viral copies(<138 copies/mL). A negative result must be combined with clinical observations, patient history, and epidemiological information. The expected result is Negative.  Fact Sheet for Patients:  EntrepreneurPulse.com.au  Fact Sheet for Healthcare Providers:  IncredibleEmployment.be  This test is no t yet approved or cleared by the [place] FDA and  has been authorized for detection and/or diagnosis of SARS-CoV-2 by FDA under an Emergency Use Authorization (EUA). This EUA will remain  in effect (meaning this test can be used) for the duration of the COVID-19 declaration under Section 564(b)(1) of the Act, 21 U.S.C.section 360bbb-3(b)(1), unless the authorization is terminated  or revoked sooner.       Influenza A by PCR NEGATIVE NEGATIVE Final   Influenza B by PCR NEGATIVE NEGATIVE Final    Comment: (NOTE) The Xpert Xpress SARS-CoV-2/FLU/RSV plus assay is intended as an aid in the diagnosis of  influenza from Nasopharyngeal swab specimens and should not be used as a sole basis for treatment. Nasal washings and aspirates are unacceptable for Xpert Xpress SARS-CoV-2/FLU/RSV testing.  Fact Sheet for Patients: EntrepreneurPulse.com.au  Fact Sheet for Healthcare Providers: IncredibleEmployment.be  This test is not yet approved or cleared by the [place] FDA and has been authorized for detection and/or diagnosis of SARS-CoV-2 by FDA under an Emergency Use Authorization (EUA). This EUA will remain in effect (meaning this test can be used) for the duration of the COVID-19 declaration under Section 564(b)(1) of the Act, 21 U.S.C. section 360bbb-3(b)(1), unless the authorization is terminated or revoked.  Performed at [facility], [address] [place], [place] [address]   Aerobic/Anaerobic Culture w Gram Stain (surgical/deep wound)     Status: None (Preliminary result)   Collection Time: 06/03/21  3:56 PM   Specimen: PATH Other;  Wound  Result Value Ref Range Status   Specimen Description   Final    FINGER RIGHT INDEX FINGER WOUND Performed at [facility], [address] [place], [place] [address]    Special Requests   Final    NONE Performed at [facility], [place], [place] [address]    Gram Stain   Final    FEW SQUAMOUS EPITHELIAL CELLS PRESENT FEW WBC PRESENT, PREDOMINANTLY PMN NO ORGANISMS SEEN Performed at [facility], [place] [address], [place], [place] [address]    Culture   Final    RARE PASTEURELLA MULTOCIDA Usually susceptible to penicillin and other beta lactam agents,quinolones,macrolides and tetracyclines. NO ANAEROBES ISOLATED; CULTURE IN PROGRESS FOR 5 DAYS    Report Status PENDING  Incomplete     Labs: BNP (last 3 results) No results for input(s): BNP in the last 8760 hours. Basic Metabolic Panel: Recent Labs  Lab 06/03/21 1120 06/04/21 0428  06/05/21 0423 06/06/21 0427  NA 135 134* 137 136  K 4.0 4.5 3.8 3.6  CL 102 105 107 103  CO2 '27 24 25 24  '$ GLUCOSE 104* 119* 93 96  BUN '10 12 13 10  '$ CREATININE 0.55 0.54 0.60 0.55  CALCIUM 9.2 8.3* 8.5* 8.8*  MG  --   --  2.1 1.8   Liver Function Tests: No results for input(s): AST, ALT, ALKPHOS, BILITOT, PROT, ALBUMIN in the last 168 hours. No results for input(s): LIPASE, AMYLASE in the last 168 hours. No results for input(s): AMMONIA in the last 168 hours. CBC: Recent Labs  Lab 06/03/21 1120 06/04/21 0428 06/05/21 0423 06/06/21 0427  WBC 9.5 6.3 6.7 7.3  NEUTROABS 6.4  --  4.1 4.6  HGB 11.9* 10.5* 10.6* 11.4*  HCT 36.7 32.5* 33.1* 33.6*  MCV 86.4 86.4 89.0 85.9  PLT 260 221 204 245   Cardiac Enzymes: No results for input(s): CKTOTAL, CKMB, CKMBINDEX, TROPONINI in the last 168 hours. BNP: Invalid input(s): POCBNP CBG: Recent Labs  Lab 06/05/21 0818 06/06/21 0747  GLUCAP 97 96   D-Dimer No results for input(s): DDIMER in the last 72 hours. Hgb A1c No results for input(s): HGBA1C in the last 72 hours. Lipid Profile No results for input(s): CHOL, HDL, LDLCALC, TRIG, CHOLHDL, LDLDIRECT in the last 72 hours. Thyroid function studies No results for input(s): TSH, T4TOTAL, T3FREE, THYROIDAB in the last 72 hours.  Invalid input(s): FREET3 Anemia work up No results for input(s): VITAMINB12, FOLATE, FERRITIN, TIBC, IRON, RETICCTPCT in the last 72 hours. Urinalysis    Component Value Date/Time   COLORURINE YELLOW 09/30/2019 1309   APPEARANCEUR CLEAR 09/30/2019 1309   LABSPEC 1.020 09/30/2019 1309   PHURINE 7.0 09/30/2019 1309   GLUCOSEU NEGATIVE 09/30/2019 1309   HGBUR NEGATIVE 09/30/2019 1309   BILIRUBINUR NEGATIVE 09/30/2019 1309   KETONESUR NEGATIVE 09/30/2019 1309   PROTEINUR NEGATIVE 09/30/2019 1309   NITRITE NEGATIVE 09/30/2019 1309   LEUKOCYTESUR TRACE (A) 09/30/2019 1309   Sepsis Labs Invalid input(s): PROCALCITONIN,  WBC,   LACTICIDVEN Microbiology Recent Results (from the past 240 hour(s))  Resp Panel by RT-PCR (Flu A&B, Covid) Nasopharyngeal Swab     Status: None   Collection Time: 06/03/21 11:20 AM   Specimen: Nasopharyngeal Swab; Nasopharyngeal(NP) swabs in vial transport medium  Result Value Ref Range Status   SARS Coronavirus 2 by RT PCR NEGATIVE NEGATIVE Final    Comment: (NOTE) SARS-CoV-2 target nucleic acids are NOT DETECTED.  The SARS-CoV-2 RNA is generally detectable in  upper respiratory specimens during the acute phase of infection. The lowest concentration of SARS-CoV-2 viral copies this assay can detect is 138 copies/mL. A negative result does not preclude SARS-Cov-2 infection and should not be used as the sole basis for treatment or other patient management decisions. A negative result may occur with  improper specimen collection/handling, submission of specimen other than nasopharyngeal swab, presence of viral mutation(s) within the areas targeted by this assay, and inadequate number of viral copies(<138 copies/mL). A negative result must be combined with clinical observations, patient history, and epidemiological information. The expected result is Negative.  Fact Sheet for Patients:  EntrepreneurPulse.com.au  Fact Sheet for Healthcare Providers:  IncredibleEmployment.be  This test is no t yet approved or cleared by the [place] FDA and  has been authorized for detection and/or diagnosis of SARS-CoV-2 by FDA under an Emergency Use Authorization (EUA). This EUA will remain  in effect (meaning this test can be used) for the duration of the COVID-19 declaration under Section 564(b)(1) of the Act, 21 U.S.C.section 360bbb-3(b)(1), unless the authorization is terminated  or revoked sooner.       Influenza A by PCR NEGATIVE NEGATIVE Final   Influenza B by PCR NEGATIVE NEGATIVE Final    Comment: (NOTE) The Xpert Xpress SARS-CoV-2/FLU/RSV plus  assay is intended as an aid in the diagnosis of influenza from Nasopharyngeal swab specimens and should not be used as a sole basis for treatment. Nasal washings and aspirates are unacceptable for Xpert Xpress SARS-CoV-2/FLU/RSV testing.  Fact Sheet for Patients: EntrepreneurPulse.com.au  Fact Sheet for Healthcare Providers: IncredibleEmployment.be  This test is not yet approved or cleared by the [place] FDA and has been authorized for detection and/or diagnosis of SARS-CoV-2 by FDA under an Emergency Use Authorization (EUA). This EUA will remain in effect (meaning this test can be used) for the duration of the COVID-19 declaration under Section 564(b)(1) of the Act, 21 U.S.C. section 360bbb-3(b)(1), unless the authorization is terminated or revoked.  Performed at [facility], [address] [place], [place] [address]   Aerobic/Anaerobic Culture w Gram Stain (surgical/deep wound)     Status: None (Preliminary result)   Collection Time: 06/03/21  3:56 PM   Specimen: PATH Other; Wound  Result Value Ref Range Status   Specimen Description   Final    FINGER RIGHT INDEX FINGER WOUND Performed at [facility], [address] [place], [place] [address]    Special Requests   Final    NONE Performed at [facility], [place], [place] [address]    Gram Stain   Final    FEW SQUAMOUS EPITHELIAL CELLS PRESENT FEW WBC PRESENT, PREDOMINANTLY PMN NO ORGANISMS SEEN Performed at [facility], [place] [address], [place], [place] [address]    Culture   Final    RARE PASTEURELLA MULTOCIDA Usually susceptible to penicillin and other beta lactam agents,quinolones,macrolides and tetracyclines. NO ANAEROBES ISOLATED; CULTURE IN PROGRESS FOR 5 DAYS    Report Status PENDING  Incomplete     Time coordinating discharge: 35 minutes  SIGNED:   Aline August, MD  [facility] 06/06/2021, 10:05  AM

## 2021-06-06 NOTE — Progress Notes (Signed)
PHARMACIST - PHYSICIAN COMMUNICATION  CONCERNING: IV to Oral Route Change Policy  RECOMMENDATION: This patient is receiving pantoprazole by the intravenous route.  Based on criteria approved by the Pharmacy and Therapeutics Committee, the intravenous medication(s) is/are being converted to the equivalent oral dose form(s).   DESCRIPTION: These criteria include: The patient is eating (either orally or via tube) and/or has been taking other orally administered medications for a least 24 hours The patient has no evidence of active gastrointestinal bleeding or impaired GI absorption (gastrectomy, short bowel, patient on TNA or NPO).  If you have questions about this conversion, please contact the [place] 06/06/2021 8:02 AM

## 2021-06-06 NOTE — Plan of Care (Signed)

## 2021-06-06 NOTE — Progress Notes (Signed)
  Subjective: 3 Days Post-Op Procedure(s) (LRB): INCISION AND DRAINAGE-Right Index Finger (Right) Patient reports pain as well-controlled.  Husband at bedside.  Patient is well, and has had no acute complaints or problems. Nausea has resolved. Plan is to go Home after hospital stay. Negative for chest pain and shortness of breath Fever: no   Objective: Vital signs in last 24 hours: Temp:  [98 F (36.7 C)-98.7 F (37.1 C)] 98.7 F (37.1 C) (09/11 0741) Pulse Rate:  [74-93] 80 (09/11 0741) Resp:  [14-18] 15 (09/11 0741) BP: (134-145)/(64-77) 134/67 (09/11 0741) SpO2:  [97 %-99 %] 97 % (09/11 0741)  Intake/Output from previous day:  Intake/Output Summary (Last 24 hours) at 06/06/2021 1022 Last data filed at 06/06/2021 0840 Gross per 24 hour  Intake 0 ml  Output --  Net 0 ml    Intake/Output this shift: No intake/output data recorded.  Labs: Recent Labs    06/03/21 1120 06/04/21 0428 06/05/21 0423 06/06/21 0427  HGB 11.9* 10.5* 10.6* 11.4*   Recent Labs    06/05/21 0423 06/06/21 0427  WBC 6.7 7.3  RBC 3.72* 3.91  HCT 33.1* 33.6*  PLT 204 245   Recent Labs    06/05/21 0423 06/06/21 0427  NA 137 136  K 3.8 3.6  CL 107 103  CO2 25 24  BUN 13 10  CREATININE 0.60 0.55  GLUCOSE 93 96  CALCIUM 8.5* 8.8*   No results for input(s): LABPT, INR in the last 72 hours.   EXAM General - Patient is Alert, Appropriate, and Oriented Extremity -  bulky dry dressing remains in place; following dressing removal, mild erythema noted over the ulnar aspect of the index finger, minimal purulent drainage noted; proximal incision without any erythema or drainage  , minimal edema; neurovascularly intact  Motor Function - able to flex and extend finger slightly without pain    Assessment/Plan: 3 Days Post-Op Procedure(s) (LRB): INCISION AND DRAINAGE-Right Index Finger (Right) Principal Problem:   Cellulitis of right hand Active Problems:   History of deep vein thrombosis  (DVT) of lower extremity   History of pulmonary embolism   Dog bite of right hand   Normocytic anemia   Flexor tenosynovitis of finger   Cellulitis  Estimated body mass index is 33.73 kg/m as calculated from the following:   Height as of this encounter: '5\' 6"'$  (1.676 m).   Weight as of this encounter: 94.8 kg.   Patient would like to discharge home today. 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 She is to contact the office tomorrow to set up f/u appointment for later this week with Cameron Proud. Warning signs were reviewed with patient and she understands.   Tramadol prescribed for breakthrough pain.   Fresh dry dressing was applied to right index finger. They will change dressing every other day.   DVT Prophylaxis - Eliquis   Cassell Smiles, PA-C [facility] Orthopaedic Surgery 06/06/2021, 10:22 AM

## 2021-06-06 NOTE — Plan of Care (Signed)
  Problem: Education: Goal: Knowledge of General Education information will improve Description: Including pain rating scale, medication(s)/side effects and non-pharmacologic comfort measures 06/06/2021 1342 by Elsie Ra, RN Outcome: Completed/Met 06/06/2021 1006 by Cimberly Stoffel, Debbe Mounts, RN Outcome: Progressing   Problem: Health Behavior/Discharge Planning: Goal: Ability to manage health-related needs will improve 06/06/2021 1342 by Devun Anna, Debbe Mounts, RN Outcome: Completed/Met 06/06/2021 1006 by Elsie Ra, RN Outcome: Progressing   Problem: Clinical Measurements: Goal: Ability to maintain clinical measurements within normal limits will improve 06/06/2021 1342 by Ifeoluwa Beller, Debbe Mounts, RN Outcome: Completed/Met 06/06/2021 1006 by Elsie Ra, RN Outcome: Progressing Goal: Will remain free from infection 06/06/2021 1342 by Elsie Ra, RN Outcome: Completed/Met 06/06/2021 1006 by Elsie Ra, RN Outcome: Progressing Goal: Diagnostic test results will improve 06/06/2021 1342 by Elsie Ra, RN Outcome: Completed/Met 06/06/2021 1006 by Elsie Ra, RN Outcome: Progressing Goal: Respiratory complications will improve 06/06/2021 1342 by Elsie Ra, RN Outcome: Completed/Met 06/06/2021 1006 by Elsie Ra, RN Outcome: Progressing Goal: Cardiovascular complication will be avoided 06/06/2021 1342 by Elsie Ra, RN Outcome: Completed/Met 06/06/2021 1006 by Keyli Duross, Debbe Mounts, RN Outcome: Progressing   Problem: Activity: Goal: Risk for activity intolerance will decrease 06/06/2021 1342 by Elsie Ra, RN Outcome: Completed/Met 06/06/2021 1006 by Elsie Ra, RN Outcome: Progressing   Problem: Nutrition: Goal: Adequate nutrition will be maintained 06/06/2021 1342 by Elsie Ra, RN Outcome: Completed/Met 06/06/2021 1006 by Elsie Ra, RN Outcome: Progressing   Problem: Coping: Goal: Level  of anxiety will decrease 06/06/2021 1342 by Solly Derasmo, Debbe Mounts, RN Outcome: Completed/Met 06/06/2021 1006 by Elsie Ra, RN Outcome: Progressing   Problem: Elimination: Goal: Will not experience complications related to bowel motility 06/06/2021 1342 by Elsie Ra, RN Outcome: Completed/Met 06/06/2021 1006 by Elsie Ra, RN Outcome: Progressing Goal: Will not experience complications related to urinary retention 06/06/2021 1342 by Travers Goodley, Debbe Mounts, RN Outcome: Completed/Met 06/06/2021 1006 by Jodiann Ognibene, Debbe Mounts, RN Outcome: Progressing   Problem: Pain Managment: Goal: General experience of comfort will improve 06/06/2021 1342 by Saphyra Hutt, Debbe Mounts, RN Outcome: Completed/Met 06/06/2021 1006 by Elsie Ra, RN Outcome: Progressing   Problem: Safety: Goal: Ability to remain free from injury will improve 06/06/2021 1342 by Charleston Hankin, Debbe Mounts, RN Outcome: Completed/Met 06/06/2021 1006 by Elsie Ra, RN Outcome: Progressing   Problem: Skin Integrity: Goal: Risk for impaired skin integrity will decrease 06/06/2021 1342 by Elsie Ra, RN Outcome: Completed/Met 06/06/2021 1006 by Elsie Ra, RN Outcome: Progressing

## 2021-06-06 NOTE — Progress Notes (Signed)
Patient d/c to home with husband.  D/C paperwork given to patient. No unanswered questions. IV removed. Tip intact. Husband at bedside and will transport home via private vehicle.  No hard scripts given.

## 2021-06-06 NOTE — Anesthesia Postprocedure Evaluation (Signed)
Anesthesia Post Note  Patient: Dana Bautista  Procedure(s) Performed: INCISION AND DRAINAGE-Right Index Finger (Right)  Patient location during evaluation: PACU Anesthesia Type: General Level of consciousness: awake and alert Pain management: pain level controlled Vital Signs Assessment: post-procedure vital signs reviewed and stable Respiratory status: spontaneous breathing, nonlabored ventilation, respiratory function stable and patient connected to nasal cannula oxygen Cardiovascular status: blood pressure returned to baseline and stable Postop Assessment: no apparent nausea or vomiting Anesthetic complications: no   No notable events documented.   Last Vitals:  Vitals:   06/06/21 0434 06/06/21 0741  BP: 140/67 134/67  Pulse: 74 80  Resp: 18 15  Temp: 36.8 C 37.1 C  SpO2: 98% 97%    Last Pain:  Vitals:   06/06/21 1254  TempSrc:   PainSc: Winlock Makell Cyr

## 2021-06-08 LAB — AEROBIC/ANAEROBIC CULTURE W GRAM STAIN (SURGICAL/DEEP WOUND)

## 2021-06-23 ENCOUNTER — Encounter: Payer: Self-pay | Admitting: Occupational Therapy

## 2021-06-23 ENCOUNTER — Ambulatory Visit: Payer: Medicare Other | Attending: Student | Admitting: Occupational Therapy

## 2021-06-23 DIAGNOSIS — R600 Localized edema: Secondary | ICD-10-CM | POA: Insufficient documentation

## 2021-06-23 DIAGNOSIS — M79641 Pain in right hand: Secondary | ICD-10-CM | POA: Diagnosis present

## 2021-06-23 DIAGNOSIS — M25641 Stiffness of right hand, not elsewhere classified: Secondary | ICD-10-CM | POA: Diagnosis present

## 2021-06-23 DIAGNOSIS — M6281 Muscle weakness (generalized): Secondary | ICD-10-CM | POA: Insufficient documentation

## 2021-06-23 NOTE — Therapy (Signed)
[facility] [address], [place], [address] Phone: [phone]   Fax:  [phone]  Occupational Therapy Evaluation  Patient Details  Name: Dana Bautista MRN: [id number] Date of Birth: March 23, 1946 Referring Provider (OT): Cameron Proud   Encounter Date: 06/23/2021   OT End of Session - 06/23/21 1610     Visit Number 1    Number of Visits 16    Date for OT Re-Evaluation 08/18/21    OT Start Time 2947    OT Stop Time 1450    OT Time Calculation (min) 47 min    Activity Tolerance Patient tolerated treatment well    Behavior During Therapy [facility] for tasks assessed/performed             Past Medical History:  Diagnosis Date   Arthritis    osteoarthriist   Cancer ([place])    skin   Complication of anesthesia    nausea   DVT (deep venous thrombosis) ([place]) 09/2019   Heart murmur    Mitral valve regurgitation    Pulmonary embolism ([facility])    Tendonitis    outer aspect of right foot   UTI (lower urinary tract infection)     Past Surgical History:  Procedure Laterality Date   ABDOMINAL HYSTERECTOMY     APPENDECTOMY     FRACTURE SURGERY     left wrist   FRACTURE SURGERY Left    wrist   INCISION AND DRAINAGE Right 06/03/2021   Procedure: INCISION AND DRAINAGE-Right Index Finger;  Surgeon: Corky Mull, MD;  Location: [facility] ORS;  Service: Orthopedics;  Laterality: Right;   TONSILLECTOMY      There were no vitals filed for this visit.   Subjective Assessment - 06/23/21 1540     Subjective  I am R handed - keep finger most of the time straight- it is swollen and keep bandaid on - this one little scab still draining little- but done with antibiotics    Pertinent History KINZLEE SELVY is a 75 y.o. female who suffered a dog bite and underwent a incision and drainage with Dr. Roland Rack on 06/03/2021. The patient was admitted to the hospital and discharged with oral doxycycline. The patient feels that the swelling and pain  continues to decrease in the index finger. The patient denies any fevers or chills at home. She denies any drainage from the right index finger. The patient has been keeping the finger covered however she has been working on gentle motion and increasing her activities. She does still report moderate stiffness in the index finger. The patient also reports a tingling sensation along the distal fingertip. Pain score today is a 3 out of 10. She presents today for suture removal and advancement of activities. She is scheduled to start formal occupational therapy next week.    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, [place], reading, hand sewing,      Right Hand AROM   R Index  MCP 0-90 70 Degrees    R Index PIP 0-100 50 Degrees   -15   R Index DIP 0-70 30 Degrees                      OT Treatments/Exercises (OP) - 06/23/21 0001       RUE Contrast Bath   Time 8 minutes    Comments prior to  soft tissue and ROM - decrease pain , increase ROM            Pt to do 2-3 x day contrast   Coban wrap night time  And isotoner glove day time - buddy strap on and off during day - pain free AROM  And during Tendon glides can wear  Rolling over red  roller for pain free extention of PIP  Scar massage or desensitization over incision that is healed  Opposition to all digits pain free  5 reps         OT Education - 06/23/21 1610     Education Details findings of eval and HEP    Person(s) Educated Patient    Methods Explanation;Demonstration;Tactile cues;Verbal cues;Handout    Comprehension Verbal cues required;Returned demonstration;Verbalized understanding              OT Short Term Goals - 06/23/21 1656       OT SHORT TERM GOAL #1   Title Pt to be independent in HEP to decrease edema, pain and increase AROM in R 2nd digit    Baseline pain 5/10, edema increase 1 cm -and AROM MC 70, PIP 50 , DIP 30    Time 3    Period Weeks    Status New    Target Date 07/13/21               OT Long Term Goals - 06/23/21 1658       OT LONG TERM GOAL #1   Title R 2nd digit AROM increase for pt to touch palm to start using in ADL's with pain less than 2/10    Baseline MC 70, PIP 50 , DIP 30 , extention of PIP -15 -- pain 5/10    Time 5    Period Weeks    Status New    Target Date 07/28/21      OT LONG TERM GOAL #2   Title R grip and prehension strenght increase to more than 60 % compare to L to carry groceries, turn doorknob and start sewing    Baseline NT - 3 wks s/p - edema nad pain    Time 8    Period Weeks    Status New    Target Date 08/17/21                   Plan - 06/23/21 1612     Clinical Impression Statement Pt present tomorrow 3 wks out from R 2nd digit dog bite with debridement and irrigation of flexor tenosynovitis( 06/03/21)  - pt with increase  edema of 1 cm more in R 2nd digit, pain 5/10 and tenderness over incisions and bite marks , decrease ROM at [facility] , PIP and DIP flexion - decrease PIP extention - and numbness at DIP and decrease muscle strength- all limiting her functional use of R dominant hand in ADL's and IADL's- pt can benefit from skilled OT services    OT Occupational Profile and  History Problem Focused Assessment - Including review of records relating to presenting problem    Occupational performance deficits (Please refer to evaluation for details): ADL's;IADL's;Work;Play;Leisure;Social Participation    Body Structure / Function / Physical Skills ADL;Coordination;Decreased knowledge of precautions;Skin integrity;Sensation;Flexibility;Scar mobility;IADL;ROM;UE functional use;Edema;Dexterity;Pain;Strength    Rehab Potential Good    Clinical Decision Making Limited treatment options, no task modification necessary    Comorbidities Affecting Occupational Performance: None    Modification or Assistance to Complete Evaluation  No modification of tasks or assist necessary to complete eval    OT Frequency 2x / week    OT Duration 8 weeks    OT Treatment/Interventions Self-care/ADL training;Fluidtherapy;Contrast Bath;Therapeutic exercise;Manual Therapy;Patient/family education;Passive range of motion;Scar mobilization;Compression bandaging;Splinting    Consulted and Agree with Plan of Care Patient             Patient will benefit from skilled therapeutic intervention in order to improve the following deficits and impairments:   Body Structure / Function / Physical Skills: ADL, Coordination, Decreased knowledge of precautions, Skin integrity, Sensation, Flexibility, Scar mobility, IADL, ROM, UE functional use, Edema, Dexterity, Pain, Strength       Visit Diagnosis: Localized edema - Plan: Ot plan of care cert/re-cert  Pain in right hand - Plan: Ot plan of care cert/re-cert  Stiffness of right hand, not elsewhere classified - Plan: Ot plan of care cert/re-cert  Muscle weakness (generalized) - Plan: Ot plan of care cert/re-cert    Problem List Patient Active Problem List   Diagnosis Date Noted   Cellulitis 06/04/2021   Cellulitis of right hand 06/03/2021   Dog bite of right hand 06/03/2021   Normocytic anemia 06/03/2021   Flexor tenosynovitis of finger     History of deep vein thrombosis (DVT) of lower extremity 10/20/2020   History of pulmonary embolism 10/20/2020   Lower leg DVT (deep venous thromboembolism), acute, right ([place]) 10/05/2019   Multiple subsegmental pulmonary emboli without acute cor pulmonale (HCC) 10/04/2019   Pericardial effusion 10/04/2019   Pleural effusion 10/04/2019   Vitamin B 12 deficiency 08/10/2017   Arm pain, left 04/25/2017   Anemia due to vitamin B12 deficiency 04/13/2017   Chronic constipation 02/25/2017   Intermittent chest pain 11/20/2016   Nausea 11/20/2016   Right ear impacted cerumen 11/20/2016   Carotid stenosis, asymptomatic, bilateral 10/06/2016   Dizziness 06/13/2016   H/O mitral valve prolapse 06/13/2016   Neck muscle strain 06/13/2016   Temporal headache 06/13/2016   Vitamin D deficiency, unspecified 12/21/2015   Chronic 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, [place], [address] Phone: [phone]   Fax:  [phone]  Name: Tieshia Rettinger MRN: [id number] Date of Birth: 05-06-1946

## 2021-06-28 ENCOUNTER — Ambulatory Visit: Payer: Medicare Other | Attending: Student | Admitting: Occupational Therapy

## 2021-06-28 DIAGNOSIS — M25641 Stiffness of right hand, not elsewhere classified: Secondary | ICD-10-CM | POA: Diagnosis present

## 2021-06-28 DIAGNOSIS — M79641 Pain in right hand: Secondary | ICD-10-CM | POA: Diagnosis present

## 2021-06-28 DIAGNOSIS — M6281 Muscle weakness (generalized): Secondary | ICD-10-CM | POA: Diagnosis present

## 2021-06-28 DIAGNOSIS — R600 Localized edema: Secondary | ICD-10-CM | POA: Diagnosis present

## 2021-06-28 NOTE — Therapy (Signed)
[facility] [address], [place], [address] Phone: [phone]   Fax:  [phone]  Occupational Therapy Treatment  Patient Details  Name: Dana Bautista MRN: [id number] Date of Birth: 06-23-1946 Referring Provider (OT): Cameron Proud   Encounter Date: 06/28/2021   OT End of Session - 06/28/21 0741     Visit Number 2    Number of Visits 16    Date for OT Re-Evaluation 08/18/21    OT Start Time 0730    OT Stop Time 0803    OT Time Calculation (min) 33 min    Activity Tolerance Patient tolerated treatment well    Behavior During Therapy [facility] for tasks assessed/performed             Past Medical History:  Diagnosis Date   Arthritis    osteoarthriist   Cancer ([place])    skin   Complication of anesthesia    nausea   DVT (deep venous thrombosis) ([place]) 09/2019   Heart murmur    Mitral valve regurgitation    Pulmonary embolism ([facility])    Tendonitis    outer aspect of right foot   UTI (lower urinary tract infection)     Past Surgical History:  Procedure Laterality Date   ABDOMINAL HYSTERECTOMY     APPENDECTOMY     FRACTURE SURGERY     left wrist   FRACTURE SURGERY Left    wrist   INCISION AND DRAINAGE Right 06/03/2021   Procedure: INCISION AND DRAINAGE-Right Index Finger;  Surgeon: Corky Mull, MD;  Location: [facility] ORS;  Service: Orthopedics;  Laterality: Right;   TONSILLECTOMY      There were no vitals filed for this visit.   Subjective Assessment - 06/28/21 0738     Subjective  I done everything - swelling little better and motion - still hurts - using little more- did use the buddy strap for short periods    Pertinent History Dana Bautista is a 75 y.o. female who suffered a dog bite and underwent a incision and drainage with Dr. Roland Rack on 06/03/2021. The patient was admitted to the hospital and discharged with oral doxycycline. The patient feels that the swelling and pain continues to decrease in the  index finger. The patient denies any fevers or chills at home. She denies any drainage from the right index finger. The patient has been keeping the finger covered however she has been working on gentle motion and increasing her activities. She does still report moderate stiffness in the index finger. The patient also reports a tingling sensation along the distal fingertip. Pain score today is a 3 out of 10. She presents today for suture removal and advancement of activities. She is scheduled to start formal occupational therapy next week.    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 Yes    Pain Score 4     Pain Location Finger (Comment which one)    Pain Orientation Right    Pain Descriptors / Indicators Aching;Tightness;Numbness;Tender    Pain Type Acute pain;Surgical pain    Pain Onset More than a month ago    Pain Frequency Intermittent                [facility] OT Assessment - 06/28/21 0001       Right Hand AROM   R Index  MCP 0-90 85 Degrees    R Index PIP 0-100 55 Degrees   ext   R Index DIP 0-70 30 Degrees              arrive with increase AROM - and extention of PIP WNL  Pain and scar tissue still tender- and one bite mark inside of digit - bleeding at times- think scab         OT Treatments/Exercises (OP) - 06/28/21 0001       RUE Contrast Bath   Time 8 minutes    Comments prior to soft tissue and mobs              Pt to cont with 2-3 x day contrast   Coban wrap night time  And isotoner glove day time - buddy strap on and off during day - pain free AROM  Ed on scar massage- and pt less tender to textures -but tender over incision and volar PIP puncture wound - focus scar mobs  And during Tendon glides can wear  buddy strap -and light PROM for DIP/PIP flexion prior to intrinsic fist Composite fist to cylinder now - smaller than foam block  Rolling over  red roller for pain free extention of PIP prior to tendon glides Opposition to all digits pain free  5 reps         OT Education - 06/28/21 0741     Education Details progress and changes to HEP    Person(s) Educated Patient    Methods Explanation;Demonstration;Tactile cues;Verbal cues;Handout    Comprehension Verbal cues required;Returned demonstration;Verbalized understanding              OT Short Term Goals - 06/23/21 1656       OT SHORT TERM GOAL #1   Title Pt to be independent in HEP to decrease edema, pain and increase AROM in R 2nd digit    Baseline pain 5/10, edema increase 1 cm -and AROM MC 70, PIP 50 , DIP 30    Time 3    Period Weeks    Status New    Target Date 07/13/21               OT Long Term Goals - 06/23/21 1658       OT LONG TERM GOAL #1   Title R 2nd digit AROM increase for pt to touch palm to start using in ADL's with pain less than 2/10    Baseline MC 70, PIP 50 , DIP 30 , extention of PIP -15 -- pain 5/10    Time 5    Period Weeks    Status New    Target Date 07/28/21      OT LONG TERM GOAL #2   Title R grip and prehension strenght increase to more than 60 % compare to L to carry groceries, turn doorknob and start sewing    Baseline NT - 3 wks s/p - edema nad pain    Time 8    Period Weeks    Status New    Target Date 08/17/21                   Plan - 06/28/21 0742     Clinical Impression Statement Pt present about 3 1/2 wks out from R 2nd digit dog bite with debridement and irrigation of flexor tenosynovitis( 06/03/21)  - pt with increase edema still but less than 1 cm this date compare to R 2nd digit, pain  3-4/10 and tenderness over incisions and bite marks , decrease ROM at [facility] , PIP and DIP flexion -  PIP extention increase to WNL- and numbness at DIP cont and decrease muscle strength- all limiting her functional use of R dominant hand in ADL's and IADL's- pt can benefit from skilled OT services    OT Occupational  Profile and History Problem Focused Assessment - Including review of records relating to presenting problem    Occupational performance deficits (Please refer to evaluation for details): ADL's;IADL's;Work;Play;Leisure;Social Participation    Body Structure / Function / Physical Skills ADL;Coordination;Decreased knowledge of precautions;Skin integrity;Sensation;Flexibility;Scar mobility;IADL;ROM;UE functional use;Edema;Dexterity;Pain;Strength    Rehab Potential Good    Clinical Decision Making Limited treatment options, no task modification necessary    Comorbidities Affecting Occupational Performance: None    Modification or Assistance to Complete Evaluation  No modification of tasks or assist necessary to complete eval    OT Frequency 2x / week    OT Duration 8 weeks    OT Treatment/Interventions Self-care/ADL training;Fluidtherapy;Contrast Bath;Therapeutic exercise;Manual Therapy;Patient/family education;Passive range of motion;Scar mobilization;Compression bandaging;Splinting    Consulted and Agree with Plan of Care Patient             Patient will benefit from skilled therapeutic intervention in order to improve the following deficits and impairments:   Body Structure / Function / Physical Skills: ADL, Coordination, Decreased knowledge of precautions, Skin integrity, Sensation, Flexibility, Scar mobility, IADL, ROM, UE functional use, Edema, Dexterity, Pain, Strength       Visit Diagnosis: Localized edema  Pain in right hand  Stiffness of right hand, not elsewhere classified  Muscle weakness (generalized)    Problem List Patient Active Problem List   Diagnosis Date Noted   Cellulitis 06/04/2021   Cellulitis of right hand 06/03/2021   Dog bite of right hand 06/03/2021   Normocytic anemia 06/03/2021   Flexor tenosynovitis of finger    History of deep vein thrombosis (DVT) of lower extremity 10/20/2020   History of pulmonary embolism 10/20/2020   Lower leg DVT (deep  venous thromboembolism), acute, right ([place]) 10/05/2019   Multiple subsegmental pulmonary emboli without acute cor pulmonale (HCC) 10/04/2019   Pericardial effusion 10/04/2019   Pleural effusion 10/04/2019   Vitamin B 12 deficiency 08/10/2017   Arm pain, left 04/25/2017   Anemia due to vitamin B12 deficiency 04/13/2017   Chronic constipation 02/25/2017   Intermittent chest pain 11/20/2016   Nausea 11/20/2016   Right ear impacted cerumen 11/20/2016   Carotid stenosis, asymptomatic, bilateral 10/06/2016   Dizziness 06/13/2016   H/O mitral valve prolapse 06/13/2016   Neck muscle strain 06/13/2016   Temporal headache 06/13/2016   Vitamin D deficiency, unspecified 12/21/2015   Chronic right-sided low back pain without sciatica 12/16/2015   Meralgia paresthetica of right side 12/16/2015   Prediabetes 12/16/2015   Urge urinary incontinence 12/16/2015    Rosalyn Gess, OTR/L,CLT 06/28/2021, 8:04 AM  [facility] [address], [place], [address] Phone: [phone]   Fax:  [phone]  Name: Tatia Petrucci MRN: [id number] Date of Birth: Sep 28, 1945

## 2021-07-02 ENCOUNTER — Ambulatory Visit: Payer: Medicare Other | Admitting: Occupational Therapy

## 2021-07-02 ENCOUNTER — Other Ambulatory Visit: Payer: Self-pay

## 2021-07-02 DIAGNOSIS — R600 Localized edema: Secondary | ICD-10-CM

## 2021-07-02 DIAGNOSIS — M25641 Stiffness of right hand, not elsewhere classified: Secondary | ICD-10-CM

## 2021-07-02 DIAGNOSIS — M79641 Pain in right hand: Secondary | ICD-10-CM

## 2021-07-02 DIAGNOSIS — M6281 Muscle weakness (generalized): Secondary | ICD-10-CM

## 2021-07-02 NOTE — Therapy (Signed)
[place] [facility] [address], [place], [address] Phone: [phone]   Fax:  [phone]  Occupational Therapy Treatment  Patient Details  Name: Dana Bautista MRN: [id number] Date of Birth: [birth year] Referring Provider (OT): Cameron Proud   Encounter Date: 07/02/2021   OT End of Session - 07/02/21 1147     Visit Number 3    Number of Visits 16    Date for OT Re-Evaluation 08/18/21    OT Start Time 1056    OT Stop Time 1140    OT Time Calculation (min) 44 min    Activity Tolerance Patient tolerated treatment well    Behavior During Therapy [facility] for tasks assessed/performed             Past Medical History:  Diagnosis Date   Arthritis    osteoarthriist   Cancer ([place])    skin   Complication of anesthesia    nausea   DVT (deep venous thrombosis) ([place]) 09/2019   Heart murmur    Mitral valve regurgitation    Pulmonary embolism ([facility])    Tendonitis    outer aspect of right foot   UTI (lower urinary tract infection)     Past Surgical History:  Procedure Laterality Date   ABDOMINAL HYSTERECTOMY     APPENDECTOMY     FRACTURE SURGERY     left wrist   FRACTURE SURGERY Left    wrist   INCISION AND DRAINAGE Right 06/03/2021   Procedure: INCISION AND DRAINAGE-Right Index Finger;  Surgeon: Corky Mull, MD;  Location: [facility] ORS;  Service: Orthopedics;  Laterality: Right;   TONSILLECTOMY      There were no vitals filed for this visit.   Subjective Assessment - 07/02/21 1146     Subjective  This placd on the side at last healed - and doing okay -but painfull still but motion better- still hard to turn doorknob, turn car key , pull , write - not able to do yet    Pertinent History Dana Bautista is a 75 y.o. female who suffered a dog bite and underwent a incision and drainage with Dr. Roland Rack on 06/03/2021. The patient was admitted to the hospital and discharged with oral doxycycline. The patient feels that the swelling  and pain continues to decrease in the index finger. The patient denies any fevers or chills at home. She denies any drainage from the right index finger. The patient has been keeping the finger covered however she has been working on gentle motion and increasing her activities. She does still report moderate stiffness in the index finger. The patient also reports a tingling sensation along the distal fingertip. Pain score today is a 3 out of 10. She presents today for suture removal and advancement of activities. She is scheduled to start formal occupational therapy next week.    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 Yes    Pain Score 5     Pain Location Finger (Comment which one)    Pain Orientation Right    Pain Descriptors / Indicators Aching;Tender;Numbness;Tightness    Pain Type Surgical pain    Pain Onset More than a month ago    Pain Frequency Intermittent                [facility] OT Assessment - 07/02/21 0001  Right Hand AROM   R Index  MCP 0-90 85 Degrees    R Index PIP 0-100 75 Degrees    R Index DIP 0-70 40 Degrees                      OT Treatments/Exercises (OP) - 07/02/21 0001       RUE Contrast Bath   Time 8 minutes    Comments prior to ROM and soft tissue             Pt to cont with 2-3 x day contrast    isotoner glove night tie with scar pad on volar scar and fitted with silicone sleeve for daytime on and off with glove   - buddy strap on and off during day - pain free AROM  Ed on scar massage- again  and pt less tender to textures - focus on 2 volar thick scar adhesions  and during Tendon glides can wear  buddy strap -and light PROM for DIP/PIP flexion prior to intrinsic fist Composite fist to cylinder now - smaller than foam block  Rolling over red roller for pain free extention of PIP prior to tendon glides Opposition to all digits  pain free  5 reps  Reinforce pt doing great progress- no resistance or strengthening indicated - healing , edema and pain - with AROM and PROM first        OT Education - 07/02/21 1147     Education Details progress and changes to HEP    Person(s) Educated Patient    Methods Explanation;Demonstration;Tactile cues;Verbal cues;Handout    Comprehension Verbal cues required;Returned demonstration;Verbalized understanding              OT Short Term Goals - 06/23/21 1656       OT SHORT TERM GOAL #1   Title Pt to be independent in HEP to decrease edema, pain and increase AROM in R 2nd digit    Baseline pain 5/10, edema increase 1 cm -and AROM MC 70, PIP 50 , DIP 30    Time 3    Period Weeks    Status New    Target Date 07/13/21               OT Long Term Goals - 06/23/21 1658       OT LONG TERM GOAL #1   Title R 2nd digit AROM increase for pt to touch palm to start using in ADL's with pain less than 2/10    Baseline MC 70, PIP 50 , DIP 30 , extention of PIP -15 -- pain 5/10    Time 5    Period Weeks    Status New    Target Date 07/28/21      OT LONG TERM GOAL #2   Title R grip and prehension strenght increase to more than 60 % compare to L to carry groceries, turn doorknob and start sewing    Baseline NT - 3 wks s/p - edema nad pain    Time 8    Period Weeks    Status New    Target Date 08/17/21                   Plan - 07/02/21 1148     Clinical Impression Statement Pt present about 4 wks out from R 2nd digit dog bite with debridement and irrigation of flexor tenosynovitis( 06/03/21)  - pt with increase edema still but less than 1 cm  this week compare to R 2nd digit, pain 2-5/10 and tenderness over incisions and bite marks , decrease ROM at [facility] , PIP and DIP flexion -  PIP extention increase to WNL- and numbness at DIP cont and decrease muscle strength-  Scars mostly healed now and able to do more scar tissue on thick volar PIP and palm one - pt to focus  on those - still hold off onthe other 2 - showed great AROM increase at PIP 20 and DIP 10 compare to earlier this week - all limiting her functional use of R dominant hand in ADL's and IADL's- pt can benefit from skilled OT services    OT Occupational Profile and History Problem Focused Assessment - Including review of records relating to presenting problem    Occupational performance deficits (Please refer to evaluation for details): ADL's;IADL's;Work;Play;Leisure;Social Participation    Body Structure / Function / Physical Skills ADL;Coordination;Decreased knowledge of precautions;Skin integrity;Sensation;Flexibility;Scar mobility;IADL;ROM;UE functional use;Edema;Dexterity;Pain;Strength    Rehab Potential Good    Clinical Decision Making Limited treatment options, no task modification necessary    Comorbidities Affecting Occupational Performance: None    Modification or Assistance to Complete Evaluation  No modification of tasks or assist necessary to complete eval    OT Frequency 2x / week    OT Duration 8 weeks    OT Treatment/Interventions Self-care/ADL training;Fluidtherapy;Contrast Bath;Therapeutic exercise;Manual Therapy;Patient/family education;Passive range of motion;Scar mobilization;Compression bandaging;Splinting    Consulted and Agree with Plan of Care Patient             Patient will benefit from skilled therapeutic intervention in order to improve the following deficits and impairments:   Body Structure / Function / Physical Skills: ADL, Coordination, Decreased knowledge of precautions, Skin integrity, Sensation, Flexibility, Scar mobility, IADL, ROM, UE functional use, Edema, Dexterity, Pain, Strength       Visit Diagnosis: Localized edema  Pain in right hand  Stiffness of right hand, not elsewhere classified  Muscle weakness (generalized)    Problem List Patient Active Problem List   Diagnosis Date Noted   Cellulitis 06/04/2021   Cellulitis of right hand  06/03/2021   Dog bite of right hand 06/03/2021   Normocytic anemia 06/03/2021   Flexor tenosynovitis of finger    History of deep vein thrombosis (DVT) of lower extremity 10/20/2020   History of pulmonary embolism 10/20/2020   Lower leg DVT (deep venous thromboembolism), acute, right ([place]) 10/05/2019   Multiple subsegmental pulmonary emboli without acute cor pulmonale (HCC) 10/04/2019   Pericardial effusion 10/04/2019   Pleural effusion 10/04/2019   Vitamin B 12 deficiency 08/10/2017   Arm pain, left 04/25/2017   Anemia due to vitamin B12 deficiency 04/13/2017   Chronic constipation 02/25/2017   Intermittent chest pain 11/20/2016   Nausea 11/20/2016   Right ear impacted cerumen 11/20/2016   Carotid stenosis, asymptomatic, bilateral 10/06/2016   Dizziness 06/13/2016   H/O mitral valve prolapse 06/13/2016   Neck muscle strain 06/13/2016   Temporal headache 06/13/2016   Vitamin D deficiency, unspecified 12/21/2015   Chronic right-sided low back pain without sciatica 12/16/2015   Meralgia paresthetica of right side 12/16/2015   Prediabetes 12/16/2015   Urge urinary incontinence 12/16/2015    Rosalyn Gess, OTR/L,CLT 07/02/2021, 11:51 AM  [place] [facility] [address] [place], [address] Phone: [phone]   Fax:  [phone]  Name: Dana Bautista MRN: [id number] Date of Birth: [birth year]

## 2021-07-05 ENCOUNTER — Ambulatory Visit: Payer: Medicare Other | Admitting: Occupational Therapy

## 2021-07-05 DIAGNOSIS — R600 Localized edema: Secondary | ICD-10-CM

## 2021-07-05 DIAGNOSIS — M25641 Stiffness of right hand, not elsewhere classified: Secondary | ICD-10-CM

## 2021-07-05 DIAGNOSIS — M79641 Pain in right hand: Secondary | ICD-10-CM

## 2021-07-05 DIAGNOSIS — M6281 Muscle weakness (generalized): Secondary | ICD-10-CM

## 2021-07-05 NOTE — Therapy (Signed)
[place] [facility] [address], [place], [address] Phone: [phone]   Fax:  [phone]  Occupational Therapy Treatment  Patient Details  Name: Dana Bautista MRN: [id number] Date of Birth: 07/11/1946 Referring Provider (OT): Cameron Proud   Encounter Date: 07/05/2021   OT End of Session - 07/05/21 0816     Visit Number 4    Number of Visits 16    Date for OT Re-Evaluation 08/18/21    OT Start Time 0815    OT Stop Time 0855    OT Time Calculation (min) 40 min    Activity Tolerance Patient tolerated treatment well    Behavior During Therapy [facility] for tasks assessed/performed             Past Medical History:  Diagnosis Date   Arthritis    osteoarthriist   Cancer ([place])    skin   Complication of anesthesia    nausea   DVT (deep venous thrombosis) ([place]) 09/2019   Heart murmur    Mitral valve regurgitation    Pulmonary embolism ([facility])    Tendonitis    outer aspect of right foot   UTI (lower urinary tract infection)     Past Surgical History:  Procedure Laterality Date   ABDOMINAL HYSTERECTOMY     APPENDECTOMY     FRACTURE SURGERY     left wrist   FRACTURE SURGERY Left    wrist   INCISION AND DRAINAGE Right 06/03/2021   Procedure: INCISION AND DRAINAGE-Right Index Finger;  Surgeon: Corky Mull, MD;  Location: [facility] ORS;  Service: Orthopedics;  Laterality: Right;   TONSILLECTOMY      There were no vitals filed for this visit.   Subjective Assessment - 07/05/21 0816     Subjective  I worked that scar - scar pad not working anymore as good- all is healed -but those 2 scars are still thick and tender - trying to use more - did little sewing in the machine    Pertinent History Dana Bautista is a 75 y.o. female who suffered a dog bite and underwent a incision and drainage with Dr. Roland Rack on 06/03/2021. The patient was admitted to the hospital and discharged with oral doxycycline. The patient feels that the  swelling and pain continues to decrease in the index finger. The patient denies any fevers or chills at home. She denies any drainage from the right index finger. The patient has been keeping the finger covered however she has been working on gentle motion and increasing her activities. She does still report moderate stiffness in the index finger. The patient also reports a tingling sensation along the distal fingertip. Pain score today is a 3 out of 10. She presents today for suture removal and advancement of activities. She is scheduled to start formal occupational therapy next week.    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 Yes    Pain Score 5     Pain Location Hand    Pain Descriptors / Indicators Aching;Tightness;Tender    Pain Type Surgical pain    Pain Onset More than a month ago    Pain Frequency Intermittent                [facility] OT Assessment - 07/05/21 0001       Right Hand AROM   R  Index  MCP 0-90 85 Degrees    R Index PIP 0-100 75 Degrees    R Index DIP 0-70 40 Degrees                      OT Treatments/Exercises (OP) - 07/05/21 0001       RUE Contrast Bath   Time 8 minutes    Comments prior to AROM and PROM            Pt to cont with 2-3 x day contrast    isotoner glove night time with scar pad on volar scar and fitted with silicone sleeve for daytime on and off with glove   - buddy strap on and off during day - pain free AROM  Ed on scar massage- again - able to do scar mobs to all scars this date - cont to be very tender and painful - extractor on medial scar and mini massager on all  Improving slow but steady tenderness to textures improved greatly    during Tendon glides can wear  buddy strap -and light PROM for DIP/PIP flexion prior to intrinsic fist Composite fist to cylinder - AAROM and buddy strap use Rolling over red roller for pain  free extention of PIP  and scar massage still prior to tendon glides Opposition to all digits pain free  5 reps  Reinforce with pt she showing great progress- no resistance or strengthening  expect that to still be painful- incision just healed last sessio and today - cont to need to focus on scar tissue, edema and pain - with AROM and PROM first           OT Education - 07/05/21 0816     Education Details progress and changes to HEP    Person(s) Educated Patient    Methods Explanation;Demonstration;Tactile cues;Verbal cues;Handout    Comprehension Verbal cues required;Returned demonstration;Verbalized understanding              OT Short Term Goals - 06/23/21 1656       OT SHORT TERM GOAL #1   Title Pt to be independent in HEP to decrease edema, pain and increase AROM in R 2nd digit    Baseline pain 5/10, edema increase 1 cm -and AROM MC 70, PIP 50 , DIP 30    Time 3    Period Weeks    Status New    Target Date 07/13/21               OT Long Term Goals - 06/23/21 1658       OT LONG TERM GOAL #1   Title R 2nd digit AROM increase for pt to touch palm to start using in ADL's with pain less than 2/10    Baseline MC 70, PIP 50 , DIP 30 , extention of PIP -15 -- pain 5/10    Time 5    Period Weeks    Status New    Target Date 07/28/21      OT LONG TERM GOAL #2   Title R grip and prehension strenght increase to more than 60 % compare to L to carry groceries, turn doorknob and start sewing    Baseline NT - 3 wks s/p - edema nad pain    Time 8    Period Weeks    Status New    Target Date 08/17/21  Plan - 07/05/21 0817     Clinical Impression Statement Pt present about 4 1/2 wks out from R 2nd digit dog bite with debridement and irrigation of flexor tenosynovitis( 06/03/21)  - pt with increase edema still less but than 1 cm now compare to R 2nd digit, pain 2-5/10 and tenderness over incisions and bite marks , decrease ROM at [facility] , PIP and  DIP flexion -  PIP extention increase to WNL- and numbness at DIP cont and decrease muscle strength-  Scars all healed now and able to do more scar tissue since last time - very tender and couple of them thick but improving - pt to focus on those - showed great AROM improvement last time and maintain progress to today-  scar tissue, edema  limiting her functional use of R dominant hand in ADL's and IADL's- pt can benefit from skilled OT services    OT Occupational Profile and History Problem Focused Assessment - Including review of records relating to presenting problem    Occupational performance deficits (Please refer to evaluation for details): ADL's;IADL's;Work;Play;Leisure;Social Participation    Body Structure / Function / Physical Skills ADL;Coordination;Decreased knowledge of precautions;Skin integrity;Sensation;Flexibility;Scar mobility;IADL;ROM;UE functional use;Edema;Dexterity;Pain;Strength    Rehab Potential Good    Clinical Decision Making Limited treatment options, no task modification necessary    Comorbidities Affecting Occupational Performance: None    Modification or Assistance to Complete Evaluation  No modification of tasks or assist necessary to complete eval    OT Frequency 2x / week    OT Duration 8 weeks    OT Treatment/Interventions Self-care/ADL training;Fluidtherapy;Contrast Bath;Therapeutic exercise;Manual Therapy;Patient/family education;Passive range of motion;Scar mobilization;Compression bandaging;Splinting    Consulted and Agree with Plan of Care Patient             Patient will benefit from skilled therapeutic intervention in order to improve the following deficits and impairments:   Body Structure / Function / Physical Skills: ADL, Coordination, Decreased knowledge of precautions, Skin integrity, Sensation, Flexibility, Scar mobility, IADL, ROM, UE functional use, Edema, Dexterity, Pain, Strength       Visit Diagnosis: Localized edema  Stiffness of right  hand, not elsewhere classified  Muscle weakness (generalized)  Pain in right hand    Problem List Patient Active Problem List   Diagnosis Date Noted   Cellulitis 06/04/2021   Cellulitis of right hand 06/03/2021   Dog bite of right hand 06/03/2021   Normocytic anemia 06/03/2021   Flexor tenosynovitis of finger    History of deep vein thrombosis (DVT) of lower extremity 10/20/2020   History of pulmonary embolism 10/20/2020   Lower leg DVT (deep venous thromboembolism), acute, right ([place]) 10/05/2019   Multiple subsegmental pulmonary emboli without acute cor pulmonale (HCC) 10/04/2019   Pericardial effusion 10/04/2019   Pleural effusion 10/04/2019   Vitamin B 12 deficiency 08/10/2017   Arm pain, left 04/25/2017   Anemia due to vitamin B12 deficiency 04/13/2017   Chronic constipation 02/25/2017   Intermittent chest pain 11/20/2016   Nausea 11/20/2016   Right ear impacted cerumen 11/20/2016   Carotid stenosis, asymptomatic, bilateral 10/06/2016   Dizziness 06/13/2016   H/O mitral valve prolapse 06/13/2016   Neck muscle strain 06/13/2016   Temporal headache 06/13/2016   Vitamin D deficiency, unspecified 12/21/2015   Chronic right-sided low back pain without sciatica 12/16/2015   Meralgia paresthetica of right side 12/16/2015   Prediabetes 12/16/2015   Urge urinary incontinence 12/16/2015    Rosalyn Gess, OTR/L,CLT 07/05/2021, 8:58 AM  [facility] [address] [place], [address] Phone: [phone]   Fax:  [phone]  Name: Dana Bautista MRN: [id number] Date of Birth: 18-Feb-1946

## 2021-07-07 ENCOUNTER — Encounter: Payer: Self-pay | Admitting: Occupational Therapy

## 2021-07-07 ENCOUNTER — Ambulatory Visit: Payer: Medicare Other | Admitting: Occupational Therapy

## 2021-07-07 DIAGNOSIS — R600 Localized edema: Secondary | ICD-10-CM | POA: Diagnosis not present

## 2021-07-07 DIAGNOSIS — M79641 Pain in right hand: Secondary | ICD-10-CM

## 2021-07-07 DIAGNOSIS — M6281 Muscle weakness (generalized): Secondary | ICD-10-CM

## 2021-07-07 DIAGNOSIS — M25641 Stiffness of right hand, not elsewhere classified: Secondary | ICD-10-CM

## 2021-07-07 NOTE — Therapy (Signed)
[facility] [address], [place], [address] Phone: [phone]   Fax:  [phone]  Occupational Therapy Treatment  Patient Details  Name: Dana Bautista MRN: [id number] Date of Birth: Oct 27, 1945 Referring Provider (OT): Cameron Proud   Encounter Date: 07/07/2021   OT End of Session - 07/07/21 1330     Visit Number 5    Number of Visits 16    Date for OT Re-Evaluation 08/18/21    OT Start Time 1319    OT Stop Time 1400    OT Time Calculation (min) 41 min    Activity Tolerance Patient tolerated treatment well    Behavior During Therapy [facility] for tasks assessed/performed             Past Medical History:  Diagnosis Date   Arthritis    osteoarthriist   Cancer ([place])    skin   Complication of anesthesia    nausea   DVT (deep venous thrombosis) ([place]) 09/2019   Heart murmur    Mitral valve regurgitation    Pulmonary embolism ([facility])    Tendonitis    outer aspect of right foot   UTI (lower urinary tract infection)     Past Surgical History:  Procedure Laterality Date   ABDOMINAL HYSTERECTOMY     APPENDECTOMY     FRACTURE SURGERY     left wrist   FRACTURE SURGERY Left    wrist   INCISION AND DRAINAGE Right 06/03/2021   Procedure: INCISION AND DRAINAGE-Right Index Finger;  Surgeon: Corky Mull, MD;  Location: [facility] ORS;  Service: Orthopedics;  Laterality: Right;   TONSILLECTOMY      There were no vitals filed for this visit.   Subjective Assessment - 07/07/21 1329     Subjective  Pt reports she did some exercises this morning and had some pain afterwards, around a 4/10 pain.  Has been wearing the silicone sleeve on finger.  Reports difficulty with scar massage and its painful.  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    Pertinent History Dana Bautista is a 75 y.o. female who suffered a dog bite and underwent a incision and drainage with Dr. Roland Rack on 06/03/2021. The patient was admitted to the  hospital and discharged with oral doxycycline. The patient feels that the swelling and pain continues to decrease in the index finger. The patient denies any fevers or chills at home. She denies any drainage from the right index finger. The patient has been keeping the finger covered however she has been working on gentle motion and increasing her activities. She does still report moderate stiffness in the index finger. The patient also reports a tingling sensation along the distal fingertip. Pain score today is a 3 out of 10. She presents today for suture removal and advancement of activities. She is scheduled to start formal occupational therapy next week.    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 Yes    Pain Score 2     Pain Location Hand    Pain Orientation Right    Pain Descriptors / Indicators Aching;Tender    Pain Type Surgical pain    Pain Onset More than a month ago    Pain Frequency Intermittent             Pt seen for contrast to hand  and wrist for 11 mins to decrease pain, increase motion and impact edema.  Performed prior to manual skills and therapeutic exercises.  New scar pad issued for nighttime. Issued new silicone sleeve since last one was soiled.  Pt to continue with isotoner glove night time with scar pad on volar scar and fitted with silicone sleeve for daytime on and off with glove. Can use buddy strap on and off during day, work towards pain free AROM  Continued education on scar massage, performed by therapist to decrease any adhesions, increase tissue mobility and improve motion.  Pt able to demonstrate scar mobs to all scars however appears hesitant since area continues to be very tender and painful.  Use of extractor on medial scar and mini massager on all   Tenderness to textures continues to improve  Rolling over red roller for pain free extension of PIP and  scar massage still prior to tendon glides Tendon glides performed this date with therapist demo and cues. Light PROM for DIP/PIP flexion prior to intrinsic fist Composite fist to cylinder for AAROM and buddy strap use Opposition to all digits pain free 5 reps     [facility] OT Assessment - 07/08/21 0001       Right Hand AROM   R Index  MCP 0-90 85 Degrees    R Index PIP 0-100 75 Degrees    R Index DIP 0-70 35 Degrees                              OT Education - 07/07/21 1330     Education Details HEP, scar massage    Person(s) Educated Patient    Methods Explanation;Demonstration;Tactile cues;Verbal cues;Handout    Comprehension Verbal cues required;Returned demonstration;Verbalized understanding              OT Short Term Goals - 06/23/21 1656       OT SHORT TERM GOAL #1   Title Pt to be independent in HEP to decrease edema, pain and increase AROM in R 2nd digit    Baseline pain 5/10, edema increase 1 cm -and AROM MC 70, PIP 50 , DIP 30    Time 3    Period Weeks    Status New    Target Date 07/13/21               OT Long Term Goals - 06/23/21 1658       OT LONG TERM GOAL #1   Title R 2nd digit AROM increase for pt to touch palm to start using in ADL's with pain less than 2/10    Baseline MC 70, PIP 50 , DIP 30 , extention of PIP -15 -- pain 5/10    Time 5    Period Weeks    Status New    Target Date 07/28/21      OT LONG TERM GOAL #2   Title R grip and prehension strenght increase to more than 60 % compare to L to carry groceries, turn doorknob and start sewing    Baseline NT - 3 wks s/p - edema nad pain    Time 8    Period Weeks    Status New    Target Date 08/17/21                   Plan - 07/07/21 1331     Clinical Impression Statement Pt s/p 5 weeks from R 2nd digit  dog bite with debridement and irrigation of flexor tenosynovitis (06-03-21).  Pt continues to demonstrate mild edema, tenderness over incisions/scars.   Decreased ROM noted but improving with therapy sessions.  Scars are closed and able to tolerate scar massage, light vibration with mini massager and extractor to select areas.  Continued progress in all areas and continues to benefit from skilled OT services to maximize safety and independence in necessary daily tasks.    OT Occupational Profile and History Problem Focused Assessment - Including review of records relating to presenting problem    Occupational performance deficits (Please refer to evaluation for details): ADL's;IADL's;Work;Play;Leisure;Social Participation    Body Structure / Function / Physical Skills ADL;Coordination;Decreased knowledge of precautions;Skin integrity;Sensation;Flexibility;Scar mobility;IADL;ROM;UE functional use;Edema;Dexterity;Pain;Strength    Rehab Potential Good    Clinical Decision Making Limited treatment options, no task modification necessary    Comorbidities Affecting Occupational Performance: None    Modification or Assistance to Complete Evaluation  No modification of tasks or assist necessary to complete eval    OT Frequency 2x / week    OT Duration 8 weeks    OT Treatment/Interventions Self-care/ADL training;Fluidtherapy;Contrast Bath;Therapeutic exercise;Manual Therapy;Patient/family education;Passive range of motion;Scar mobilization;Compression bandaging;Splinting    Consulted and Agree with Plan of Care Patient             Patient will benefit from skilled therapeutic intervention in order to improve the following deficits and impairments:   Body Structure / Function / Physical Skills: ADL, Coordination, Decreased knowledge of precautions, Skin integrity, Sensation, Flexibility, Scar mobility, IADL, ROM, UE functional use, Edema, Dexterity, Pain, Strength       Visit Diagnosis: Localized edema  Muscle weakness (generalized)  Pain in right hand  Stiffness of right hand, not elsewhere classified    Problem List Patient Active  Problem List   Diagnosis Date Noted   Cellulitis 06/04/2021   Cellulitis of right hand 06/03/2021   Dog bite of right hand 06/03/2021   Normocytic anemia 06/03/2021   Flexor tenosynovitis of finger    History of deep vein thrombosis (DVT) of lower extremity 10/20/2020   History of pulmonary embolism 10/20/2020   Lower leg DVT (deep venous thromboembolism), acute, right (HCC) 10/05/2019   Multiple subsegmental pulmonary emboli without acute cor pulmonale (HCC) 10/04/2019   Pericardial effusion 10/04/2019   Pleural effusion 10/04/2019   Vitamin B 12 deficiency 08/10/2017   Arm pain, left 04/25/2017   Anemia due to vitamin B12 deficiency 04/13/2017   Chronic constipation 02/25/2017   Intermittent chest pain 11/20/2016   Nausea 11/20/2016   Right ear impacted cerumen 11/20/2016   Carotid stenosis, asymptomatic, bilateral 10/06/2016   Dizziness 06/13/2016   H/O mitral valve prolapse 06/13/2016   Neck muscle strain 06/13/2016   Temporal headache 06/13/2016   Vitamin D deficiency, unspecified 12/21/2015   Chronic right-sided low back pain without sciatica 12/16/2015   Meralgia paresthetica of right side 12/16/2015   Prediabetes 12/16/2015   Urge urinary incontinence 12/16/2015    Sahiba Granholm T Mileydi Milsap, OTR/L, CLT Lemon Sternberg, OT/L 07/08/2021, 4:20 PM  [place] [place] [facility] [address] [place], [address] Phone: [phone]   Fax:  [phone]  Name: Dana Bautista MRN: [id number] Date of Birth: Jan 16, 1946

## 2021-07-13 ENCOUNTER — Ambulatory Visit: Payer: Medicare Other | Admitting: Occupational Therapy

## 2021-07-13 DIAGNOSIS — M6281 Muscle weakness (generalized): Secondary | ICD-10-CM

## 2021-07-13 DIAGNOSIS — R600 Localized edema: Secondary | ICD-10-CM

## 2021-07-13 DIAGNOSIS — M79641 Pain in right hand: Secondary | ICD-10-CM

## 2021-07-13 DIAGNOSIS — M25641 Stiffness of right hand, not elsewhere classified: Secondary | ICD-10-CM

## 2021-07-13 NOTE — Therapy (Signed)
[facility] [address], [place], [address] Phone: [phone]   Fax:  [phone]  Occupational Therapy Treatment  Patient Details  Name: Yeira Gulden MRN: [id number] Date of Birth: [birth year] Referring Provider (OT): Cameron Proud   Encounter Date: 07/13/2021   OT End of Session - 07/13/21 1156     Visit Number 6    Number of Visits 16    Date for OT Re-Evaluation 08/18/21    OT Start Time 1034    OT Stop Time 1115    OT Time Calculation (min) 41 min    Activity Tolerance Patient tolerated treatment well    Behavior During Therapy [facility] for tasks assessed/performed             Past Medical History:  Diagnosis Date   Arthritis    osteoarthriist   Cancer ([place])    skin   Complication of anesthesia    nausea   DVT (deep venous thrombosis) ([place]) 09/2019   Heart murmur    Mitral valve regurgitation    Pulmonary embolism ([facility])    Tendonitis    outer aspect of right foot   UTI (lower urinary tract infection)     Past Surgical History:  Procedure Laterality Date   ABDOMINAL HYSTERECTOMY     APPENDECTOMY     FRACTURE SURGERY     left wrist   FRACTURE SURGERY Left    wrist   INCISION AND DRAINAGE Right 06/03/2021   Procedure: INCISION AND DRAINAGE-Right Index Finger;  Surgeon: Corky Mull, MD;  Location: [facility] ORS;  Service: Orthopedics;  Laterality: Right;   TONSILLECTOMY      There were no vitals filed for this visit.   Subjective Assessment - 07/13/21 1154     Subjective  Swelling coming down and using it more- still wearing compression and buddy strap - can turn doorknob now - scars still hurts and bending it    Pertinent History NEEVA TREW is a 75 y.o. female who suffered a dog bite and underwent a incision and drainage with Dr. Roland Rack on 06/03/2021. The patient was admitted to the hospital and discharged with oral doxycycline. The patient feels that the swelling and pain continues to  decrease in the index finger. The patient denies any fevers or chills at home. She denies any drainage from the right index finger. The patient has been keeping the finger covered however she has been working on gentle motion and increasing her activities. She does still report moderate stiffness in the index finger. The patient also reports a tingling sensation along the distal fingertip. Pain score today is a 3 out of 10. She presents today for suture removal and advancement of activities. She is scheduled to start formal occupational therapy next week.    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 Yes    Pain Score 8     Pain Location Finger (Comment which one)    Pain Orientation Right    Pain Descriptors / Indicators Tender;Tightness    Pain Type Surgical pain    Pain Onset More than a month ago    Pain Frequency Intermittent                [facility] OT Assessment - 07/13/21 0001       Right Hand AROM   R Index  MCP 0-90 85 Degrees   PROM 90   R Index PIP 0-100 --   blocked 90, PROM end of session 100 pain 8/10 composite flexion   R Index DIP 0-70 45 Degrees   blocked                     OT Treatments/Exercises (OP) - 07/13/21 0001       RUE Paraffin   Number Minutes Paraffin 8 Minutes    RUE Paraffin Location Hand    Comments prior to scadr massage and ROM             Pt to cont with 2-3 x day contrast    isotoner glove night time with scar pad on volar scar and fitted with silicone sleeve for daytime on and off with glove   - buddy strap on and off during day - pain free AROM  Ed on scar massage- again - able to do scar mobs to all scars this date - cont to be very tender and painful  on ulnar side scars more than volar scars  Done manual by OT , mini massager and xtractor-  Volar scars improving with use of mini massager    Improving slow but steady-  review again with pt doing scar massage - focus on ulnar scar inbetween 2nd and 3rd  tenderness to textures improved greatly  - still to do towel between digits  during Tendon glides can wear  buddy strap -and light PROM for DIP/PIP flexion prior to intrinsic fist Gentle PROM to MC , PIP and DIP done -and composite by OT - pain increase 8/10 over dorsal digits  Composite fist to cylinder - AAROM and buddy strap use Rolling over red roller for pain free extention of PIP  and scar massage still prior to tendon glides Opposition to all digits pain free  5 reps          OT Education - 07/13/21 1156     Education Details HEP, scar massage    Person(s) Educated Patient    Methods Explanation;Demonstration;Tactile cues;Verbal cues;Handout    Comprehension Verbal cues required;Returned demonstration;Verbalized understanding              OT Short Term Goals - 06/23/21 1656       OT SHORT TERM GOAL #1   Title Pt to be independent in HEP to decrease edema, pain and increase AROM in R 2nd digit    Baseline pain 5/10, edema increase 1 cm -and AROM MC 70, PIP 50 , DIP 30    Time 3    Period Weeks    Status New    Target Date 07/13/21               OT Long Term Goals - 06/23/21 1658       OT LONG TERM GOAL #1   Title R 2nd digit AROM increase for pt to touch palm to start using in ADL's with pain less than 2/10    Baseline MC 70, PIP 50 , DIP 30 , extention of PIP -15 -- pain 5/10    Time 5    Period Weeks    Status New    Target Date 07/28/21      OT LONG TERM GOAL #2   Title R grip and prehension strenght increase to more than 60 % compare to L to carry groceries, turn doorknob and start sewing    Baseline NT - 3 wks s/p -  edema nad pain    Time 8    Period Weeks    Status New    Target Date 08/17/21                   Plan - 07/13/21 1156     Clinical Impression Statement Pt present about 5 1/2  wks out from R 2nd digit dog bite with debridement and  irrigation of flexor tenosynovitis( 06/03/21)  - pt edema decreasing , pain 2-5/10 with AROM - but with scar mobs and composite flexion AROM and PROM increase to 6-8/10 pain -  cont to show tenderness over incisions and bite marks , decrease ROM at [facility] , PIP and DIP flexion -  PIP extention increase to WNL- decrease muscle strength-  Scars all healed now and able to do more scar tissue mobs - very tender and couple of them thick but improving - pt to focus on the ulnar side scars - showing increase AROM at PIP and DIP when blocked -   scar tissue, pain and edema  limiting her functional use of R dominant hand in ADL's and IADL's- pt can benefit from skilled OT services    OT Occupational Profile and History Problem Focused Assessment - Including review of records relating to presenting problem    Occupational performance deficits (Please refer to evaluation for details): ADL's;IADL's;Work;Play;Leisure;Social Participation    Body Structure / Function / Physical Skills ADL;Coordination;Decreased knowledge of precautions;Skin integrity;Sensation;Flexibility;Scar mobility;IADL;ROM;UE functional use;Edema;Dexterity;Pain;Strength    Rehab Potential Good    Clinical Decision Making Limited treatment options, no task modification necessary    Comorbidities Affecting Occupational Performance: None    Modification or Assistance to Complete Evaluation  No modification of tasks or assist necessary to complete eval    OT Frequency 2x / week    OT Duration 6 weeks    OT Treatment/Interventions Self-care/ADL training;Fluidtherapy;Contrast Bath;Therapeutic exercise;Manual Therapy;Patient/family education;Passive range of motion;Scar mobilization;Compression bandaging;Splinting    Consulted and Agree with Plan of Care Patient             Patient will benefit from skilled therapeutic intervention in order to improve the following deficits and impairments:   Body Structure / Function / Physical Skills: ADL,  Coordination, Decreased knowledge of precautions, Skin integrity, Sensation, Flexibility, Scar mobility, IADL, ROM, UE functional use, Edema, Dexterity, Pain, Strength       Visit Diagnosis: Localized edema  Muscle weakness (generalized)  Pain in right hand  Stiffness of right hand, not elsewhere classified    Problem List Patient Active Problem List   Diagnosis Date Noted   Cellulitis 06/04/2021   Cellulitis of right hand 06/03/2021   Dog bite of right hand 06/03/2021   Normocytic anemia 06/03/2021   Flexor tenosynovitis of finger    History of deep vein thrombosis (DVT) of lower extremity 10/20/2020   History of pulmonary embolism 10/20/2020   Lower leg DVT (deep venous thromboembolism), acute, right ([place]) 10/05/2019   Multiple subsegmental pulmonary emboli without acute cor pulmonale (HCC) 10/04/2019   Pericardial effusion 10/04/2019   Pleural effusion 10/04/2019   Vitamin B 12 deficiency 08/10/2017   Arm pain, left 04/25/2017   Anemia due to vitamin B12 deficiency 04/13/2017   Chronic constipation 02/25/2017   Intermittent chest pain 11/20/2016   Nausea 11/20/2016   Right ear impacted cerumen 11/20/2016   Carotid stenosis, asymptomatic, bilateral 10/06/2016   Dizziness 06/13/2016   H/O mitral valve prolapse 06/13/2016   Neck muscle strain 06/13/2016   Temporal headache 06/13/2016  Vitamin D deficiency, unspecified 12/21/2015   Chronic right-sided low back pain without sciatica 12/16/2015   Meralgia paresthetica of right side 12/16/2015   Prediabetes 12/16/2015   Urge urinary incontinence 12/16/2015    Rosalyn Gess, OTR/L,CLT 07/13/2021, 12:01 PM  [place] [facility] [address], [place], [address] Phone: [phone]   Fax:  [phone]  Name: Zachary Lovins MRN: [id number] Date of Birth: 1946/02/27

## 2021-07-16 ENCOUNTER — Ambulatory Visit: Payer: Medicare Other | Admitting: Occupational Therapy

## 2021-07-20 ENCOUNTER — Ambulatory Visit: Payer: Medicare Other | Admitting: Occupational Therapy

## 2021-07-22 ENCOUNTER — Ambulatory Visit: Payer: Medicare Other | Admitting: Occupational Therapy

## 2021-07-27 ENCOUNTER — Ambulatory Visit: Payer: Medicare Other | Attending: Student | Admitting: Occupational Therapy

## 2021-07-27 DIAGNOSIS — M25641 Stiffness of right hand, not elsewhere classified: Secondary | ICD-10-CM | POA: Insufficient documentation

## 2021-07-27 DIAGNOSIS — M6281 Muscle weakness (generalized): Secondary | ICD-10-CM | POA: Diagnosis not present

## 2021-07-27 DIAGNOSIS — R600 Localized edema: Secondary | ICD-10-CM | POA: Insufficient documentation

## 2021-07-27 DIAGNOSIS — M79641 Pain in right hand: Secondary | ICD-10-CM | POA: Diagnosis present

## 2021-07-27 NOTE — Therapy (Signed)
[facility] [place] [address], [place], [address] Phone: [phone]   Fax:  [phone]  Occupational Therapy Treatment  Patient Details  Name: Dana Bautista MRN: [id number] Date of Birth: 01/15/1946 Referring Provider (OT): Cameron Proud   Encounter Date: 07/27/2021   OT End of Session - 07/27/21 1316     Visit Number 7    Number of Visits 16    Date for OT Re-Evaluation 08/18/21    OT Start Time 1316    OT Stop Time 1353    OT Time Calculation (min) 37 min    Activity Tolerance Patient tolerated treatment well    Behavior During Therapy [facility] for tasks assessed/performed             Past Medical History:  Diagnosis Date   Arthritis    osteoarthriist   Cancer ([place])    skin   Complication of anesthesia    nausea   DVT (deep venous thrombosis) ([place]) 09/2019   Heart murmur    Mitral valve regurgitation    Pulmonary embolism ([facility])    Tendonitis    outer aspect of right foot   UTI (lower urinary tract infection)     Past Surgical History:  Procedure Laterality Date   ABDOMINAL HYSTERECTOMY     APPENDECTOMY     FRACTURE SURGERY     left wrist   FRACTURE SURGERY Left    wrist   INCISION AND DRAINAGE Right 06/03/2021   Procedure: INCISION AND DRAINAGE-Right Index Finger;  Surgeon: Corky Mull, MD;  Location: [facility] ORS;  Service: Orthopedics;  Laterality: Right;   TONSILLECTOMY      There were no vitals filed for this visit.   Subjective Assessment - 07/27/21 1315     Subjective  My finger better - I can make better fist - and started doing some machine sewing - was sick and did not do to much exercises- stiffness more but pain still  with making fist    Pertinent History Dana Bautista is a 75 y.o. female who suffered a dog bite and underwent a incision and drainage with Dr. Roland Rack on 06/03/2021. The patient was admitted to the hospital and discharged with oral doxycycline. The patient feels that the  swelling and pain continues to decrease in the index finger. The patient denies any fevers or chills at home. She denies any drainage from the right index finger. The patient has been keeping the finger covered however she has been working on gentle motion and increasing her activities. She does still report moderate stiffness in the index finger. The patient also reports a tingling sensation along the distal fingertip. Pain score today is a 3 out of 10. She presents today for suture removal and advancement of activities. She is scheduled to start formal occupational therapy next week.    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 Yes    Pain Score 3     Pain Location Finger (Comment which one)    Pain Orientation Right    Pain Descriptors / Indicators Tender;Tightness    Pain Type Surgical pain    Pain Onset More than a month ago                [facility] OT Assessment - 07/27/21 0001       Strength   Right  Hand Grip (lbs) 20    Right Hand Lateral Pinch 7 lbs   pain   Right Hand 3 Point Pinch 5 lbs   pain   Left Hand Grip (lbs) 32    Left Hand Lateral Pinch 11 lbs    Left Hand 3 Point Pinch 10 lbs      Right Hand AROM   R Index  MCP 0-90 90 Degrees    R Index PIP 0-100 95 Degrees    R Index DIP 0-70 60 Degrees             Made great progress in composite flexion and PIP /DIP compare to 2 wks ago  Grip and prehension strength decrease - limited by pain          OT Treatments/Exercises (OP) - 07/27/21 0001       RUE Paraffin   Number Minutes Paraffin 8 Minutes    RUE Paraffin Location Hand    Comments prior to scar tissue and ROM             cont with new silicone sleeve for daytime and night time    - buddy strap on and off during day as needed- pain free AROM  Review again scar massage- able to do scar mobs to all scars e - cont to be very tender and painful   on ulnar side scars more than volar scars  Done manual by OT , mini massager  Volar scars improve greatly  with use of mini massager    tenderness to textures improved greatly  - still to do towel between digits  during Tendon glides can wear  buddy strap -and light PROM for DIP/PIP flexion prior to intrinsic fist Gentle PROM to [facility] , PIP and DIP done -and composite by OT - pain 3/10 over dorsal digits with ROM -and scar massage 6/10   Composite fist to cylinder - AAROM and buddy strap use Rolling over red roller for pain free extention of PIP  and scar massage still prior to tendon glides Opposition to all digits pain free  5 reps  Attempted putty on R hand -but pain increase in 2nd and thumb CMC - hold off on putty for R           OT Education - 07/27/21 1356     Education Details HEP, scar massage    Person(s) Educated Patient    Methods Explanation;Demonstration;Tactile cues;Verbal cues;Handout    Comprehension Verbal cues required;Returned demonstration;Verbalized understanding              OT Short Term Goals - 06/23/21 1656       OT SHORT TERM GOAL #1   Title Pt to be independent in HEP to decrease edema, pain and increase AROM in R 2nd digit    Baseline pain 5/10, edema increase 1 cm -and AROM MC 70, PIP 50 , DIP 30    Time 3    Period Weeks    Status New    Target Date 07/13/21               OT Long Term Goals - 06/23/21 1658       OT LONG TERM GOAL #1   Title R 2nd digit AROM increase for pt to touch palm to start using in ADL's with pain less than 2/10    Baseline MC 70, PIP 50 , DIP 30 , extention of PIP -15 -- pain 5/10    Time 5  Period Weeks    Status New    Target Date 07/28/21      OT LONG TERM GOAL #2   Title R grip and prehension strenght increase to more than 60 % compare to L to carry groceries, turn doorknob and start sewing    Baseline NT - 3 wks s/p - edema nad pain    Time 8    Period Weeks    Status New    Target Date  08/17/21                   Plan - 07/27/21 1316     Clinical Impression Statement Pt present about 7 1/2  wks out from R 2nd digit dog bite with debridement and irrigation of flexor tenosynovitis( 06/03/21)  - pt edema decreasing , pain 3/10 with AROM - but with scar mobs and composite flexion AROM and PROM increase to 6/10 pain -  Pt made great progress in composite fist and PIP flexion -  cont to show tenderness over incisions and bite marks , decrease  intrinsic fist and composite fleixon -  PIP extention increase to WNL- decrease prehension strenght more than Grip compare to L hand-  Scars all healed - most tenderness over ulnar side scars on 2nd digit  -   scar tissue, pain and edema  limiting her functional use of R dominant hand in ADL's and IADL's- pt can benefit from skilled OT services    OT Occupational Profile and History Problem Focused Assessment - Including review of records relating to presenting problem    Occupational performance deficits (Please refer to evaluation for details): ADL's;IADL's;Work;Play;Leisure;Social Participation    Body Structure / Function / Physical Skills ADL;Coordination;Decreased knowledge of precautions;Skin integrity;Sensation;Flexibility;Scar mobility;IADL;ROM;UE functional use;Edema;Dexterity;Pain;Strength    Rehab Potential Good    Clinical Decision Making Limited treatment options, no task modification necessary    Comorbidities Affecting Occupational Performance: None    Modification or Assistance to Complete Evaluation  No modification of tasks or assist necessary to complete eval    OT Frequency 2x / week    OT Duration 6 weeks    OT Treatment/Interventions Self-care/ADL training;Fluidtherapy;Contrast Bath;Therapeutic exercise;Manual Therapy;Patient/family education;Passive range of motion;Scar mobilization;Compression bandaging;Splinting    Consulted and Agree with Plan of Care Patient             Patient will benefit from skilled  therapeutic intervention in order to improve the following deficits and impairments:   Body Structure / Function / Physical Skills: ADL, Coordination, Decreased knowledge of precautions, Skin integrity, Sensation, Flexibility, Scar mobility, IADL, ROM, UE functional use, Edema, Dexterity, Pain, Strength       Visit Diagnosis: Muscle weakness (generalized)  Pain in right hand  Stiffness of right hand, not elsewhere classified  Localized edema    Problem List Patient Active Problem List   Diagnosis Date Noted   Cellulitis 06/04/2021   Cellulitis of right hand 06/03/2021   Dog bite of right hand 06/03/2021   Normocytic anemia 06/03/2021   Flexor tenosynovitis of finger    History of deep vein thrombosis (DVT) of lower extremity 10/20/2020   History of pulmonary embolism 10/20/2020   Lower leg DVT (deep venous thromboembolism), acute, right ([place]) 10/05/2019   Multiple subsegmental pulmonary emboli without acute cor pulmonale (HCC) 10/04/2019   Pericardial effusion 10/04/2019   Pleural effusion 10/04/2019   Vitamin B 12 deficiency 08/10/2017   Arm pain, left 04/25/2017   Anemia due to vitamin B12 deficiency 04/13/2017   Chronic  constipation 02/25/2017   Intermittent chest pain 11/20/2016   Nausea 11/20/2016   Right ear impacted cerumen 11/20/2016   Carotid stenosis, asymptomatic, bilateral 10/06/2016   Dizziness 06/13/2016   H/O mitral valve prolapse 06/13/2016   Neck muscle strain 06/13/2016   Temporal headache 06/13/2016   Vitamin D deficiency, unspecified 12/21/2015   Chronic right-sided low back pain without sciatica 12/16/2015   Meralgia paresthetica of right side 12/16/2015   Prediabetes 12/16/2015   Urge urinary incontinence 12/16/2015    Rosalyn Gess, OTR/L,CLT 07/27/2021, 2:00 PM  [place] [facility] [address] [place], [address] Phone: [phone]   Fax:  [phone]  Name: Briceyda Abdullah MRN: [id number] Date of Birth: 1945/12/19

## 2021-07-30 ENCOUNTER — Other Ambulatory Visit: Payer: Self-pay

## 2021-07-30 ENCOUNTER — Ambulatory Visit: Payer: Medicare Other | Admitting: Occupational Therapy

## 2021-07-30 DIAGNOSIS — M6281 Muscle weakness (generalized): Secondary | ICD-10-CM

## 2021-07-30 DIAGNOSIS — R600 Localized edema: Secondary | ICD-10-CM

## 2021-07-30 DIAGNOSIS — M25641 Stiffness of right hand, not elsewhere classified: Secondary | ICD-10-CM

## 2021-07-30 DIAGNOSIS — M79641 Pain in right hand: Secondary | ICD-10-CM

## 2021-07-30 NOTE — Therapy (Signed)
[place] [facility] [address], [place], [address] Phone: [phone]   Fax:  [phone]  Occupational Therapy Treatment  Patient Details  Name: Dana Bautista MRN: [id number] Date of Birth: May 13, 1946 Referring Provider (OT): Cameron Proud   Encounter Date: 07/30/2021   OT End of Session - 07/30/21 1047     Visit Number 8    Number of Visits 16    Date for OT Re-Evaluation 08/18/21    OT Start Time 1000    OT Stop Time 5852    OT Time Calculation (min) 35 min    Activity Tolerance Patient tolerated treatment well    Behavior During Therapy [facility] for tasks assessed/performed             Past Medical History:  Diagnosis Date   Arthritis    osteoarthriist   Cancer ([place])    skin   Complication of anesthesia    nausea   DVT (deep venous thrombosis) ([place]) 09/2019   Heart murmur    Mitral valve regurgitation    Pulmonary embolism ([facility])    Tendonitis    outer aspect of right foot   UTI (lower urinary tract infection)     Past Surgical History:  Procedure Laterality Date   ABDOMINAL HYSTERECTOMY     APPENDECTOMY     FRACTURE SURGERY     left wrist   FRACTURE SURGERY Left    wrist   INCISION AND DRAINAGE Right 06/03/2021   Procedure: INCISION AND DRAINAGE-Right Index Finger;  Surgeon: Corky Mull, MD;  Location: [facility] ORS;  Service: Orthopedics;  Laterality: Right;   TONSILLECTOMY      There were no vitals filed for this visit.   Subjective Assessment - 07/30/21 1045     Subjective  I worked that scar - getting there- using it little more with sewing and feeling little pins and needles in the tip of my finger - hard to use on phone- I have this cold I am trying to get rid of - on antibiotics now    Pertinent History Dana Bautista is a 75 y.o. female who suffered a dog bite and underwent a incision and drainage with Dr. Roland Rack on 06/03/2021. The patient was admitted to the hospital and discharged with oral  doxycycline. The patient feels that the swelling and pain continues to decrease in the index finger. The patient denies any fevers or chills at home. She denies any drainage from the right index finger. The patient has been keeping the finger covered however she has been working on gentle motion and increasing her activities. She does still report moderate stiffness in the index finger. The patient also reports a tingling sensation along the distal fingertip. Pain score today is a 3 out of 10. She presents today for suture removal and advancement of activities. She is scheduled to start formal occupational therapy next week.    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 Yes    Pain Score 2     Pain Location Finger (Comment which one)    Pain Orientation Right    Pain Descriptors / Indicators Tender;Tightness    Pain Type Surgical pain    Pain Onset More than a month ago    Pain Frequency Intermittent  [facility] OT Assessment - 07/30/21 0001       Right Hand AROM   R Index  MCP 0-90 90 Degrees    R Index PIP 0-100 90 Degrees    R Index DIP 0-70 60 Degrees                      OT Treatments/Exercises (OP) - 07/30/21 0001       RUE Paraffin   Number Minutes Paraffin 8 Minutes    RUE Paraffin Location Hand    Comments prior to scar tissue mobs            cont with new silicone sleeve for daytime and night time    - buddy strap on and off during day as needed- pain free AROM  Scars progressing very well - not as tender on the volar ones - tightness more than pain with AROM - flexion now  Pt to cont doing scar mobs to all scars  - cont to be only tender on the onulnar side scars more than volar scars  Done manual by OT , mini massager  Volar scars improve greatly  with use of mini massager      cont light PROM for DIP/PIP flexion prior to intrinsic  fist Gentle PROM to [facility] , PIP and DIP done -and composite by OT - pain 2/10 over dorsal digits with ROM   Composite fist to cylinder - AAROM and buddy strap use Rolling over red roller for pain free extention of PIP  and scar massage still prior to tendon glides Opposition to all digits pain free  5 reps  Attempted putty on R hand  last time -but pain increase in 2nd and thumb CMC - hold off on putty for R hand  But increase functional use pain free           OT Education - 07/30/21 1047     Education Details HEP, scar massage    Person(s) Educated Patient    Methods Explanation;Demonstration;Tactile cues;Verbal cues;Handout    Comprehension Verbal cues required;Returned demonstration;Verbalized understanding              OT Short Term Goals - 06/23/21 1656       OT SHORT TERM GOAL #1   Title Pt to be independent in HEP to decrease edema, pain and increase AROM in R 2nd digit    Baseline pain 5/10, edema increase 1 cm -and AROM MC 70, PIP 50 , DIP 30    Time 3    Period Weeks    Status New    Target Date 07/13/21               OT Long Term Goals - 06/23/21 1658       OT LONG TERM GOAL #1   Title R 2nd digit AROM increase for pt to touch palm to start using in ADL's with pain less than 2/10    Baseline MC 70, PIP 50 , DIP 30 , extention of PIP -15 -- pain 5/10    Time 5    Period Weeks    Status New    Target Date 07/28/21      OT LONG TERM GOAL #2   Title R grip and prehension strenght increase to more than 60 % compare to L to carry groceries, turn doorknob and start sewing    Baseline NT - 3 wks s/p - edema nad pain    Time 8  Period Weeks    Status New    Target Date 08/17/21                   Plan - 07/30/21 1117     OT Occupational Profile and History Problem Focused Assessment - Including review of records relating to presenting problem    Occupational performance deficits (Please refer to evaluation for details):  ADL's;IADL's;Work;Play;Leisure;Social Participation    Body Structure / Function / Physical Skills ADL;Coordination;Decreased knowledge of precautions;Skin integrity;Sensation;Flexibility;Scar mobility;IADL;ROM;UE functional use;Edema;Dexterity;Pain;Strength    Rehab Potential Good    Clinical Decision Making Limited treatment options, no task modification necessary    Comorbidities Affecting Occupational Performance: None    Modification or Assistance to Complete Evaluation  No modification of tasks or assist necessary to complete eval    OT Frequency 2x / week    OT Duration 6 weeks    OT Treatment/Interventions Self-care/ADL training;Fluidtherapy;Contrast Bath;Therapeutic exercise;Manual Therapy;Patient/family education;Passive range of motion;Scar mobilization;Compression bandaging;Splinting    Consulted and Agree with Plan of Care Patient             Patient will benefit from skilled therapeutic intervention in order to improve the following deficits and impairments:   Body Structure / Function / Physical Skills: ADL, Coordination, Decreased knowledge of precautions, Skin integrity, Sensation, Flexibility, Scar mobility, IADL, ROM, UE functional use, Edema, Dexterity, Pain, Strength       Visit Diagnosis: Muscle weakness (generalized)  Stiffness of right hand, not elsewhere classified  Localized edema  Pain in right hand    Problem List Patient Active Problem List   Diagnosis Date Noted   Cellulitis 06/04/2021   Cellulitis of right hand 06/03/2021   Dog bite of right hand 06/03/2021   Normocytic anemia 06/03/2021   Flexor tenosynovitis of finger    History of deep vein thrombosis (DVT) of lower extremity 10/20/2020   History of pulmonary embolism 10/20/2020   Lower leg DVT (deep venous thromboembolism), acute, right ([facility]) 10/05/2019   Multiple subsegmental pulmonary emboli without acute cor pulmonale (HCC) 10/04/2019   Pericardial effusion 10/04/2019   Pleural  effusion 10/04/2019   Vitamin B 12 deficiency 08/10/2017   Arm pain, left 04/25/2017   Anemia due to vitamin B12 deficiency 04/13/2017   Chronic constipation 02/25/2017   Intermittent chest pain 11/20/2016   Nausea 11/20/2016   Right ear impacted cerumen 11/20/2016   Carotid stenosis, asymptomatic, bilateral 10/06/2016   Dizziness 06/13/2016   H/O mitral valve prolapse 06/13/2016   Neck muscle strain 06/13/2016   Temporal headache 06/13/2016   Vitamin D deficiency, unspecified 12/21/2015   Chronic right-sided low back pain without sciatica 12/16/2015   Meralgia paresthetica of right side 12/16/2015   Prediabetes 12/16/2015   Urge urinary incontinence 12/16/2015    Rosalyn Gess, OTR/L,CLT 07/30/2021, 11:19 AM  [place] [address] [place], [address] Phone: [phone]   Fax:  [phone]  Name: Adrianne Shackleton MRN: [id number] Date of Birth: [birth year]

## 2021-08-09 ENCOUNTER — Encounter: Payer: Medicare Other | Admitting: Occupational Therapy

## 2021-08-10 ENCOUNTER — Ambulatory Visit: Payer: Medicare Other | Admitting: Occupational Therapy

## 2021-08-10 DIAGNOSIS — M6281 Muscle weakness (generalized): Secondary | ICD-10-CM | POA: Diagnosis not present

## 2021-08-10 DIAGNOSIS — R600 Localized edema: Secondary | ICD-10-CM

## 2021-08-10 DIAGNOSIS — M25641 Stiffness of right hand, not elsewhere classified: Secondary | ICD-10-CM

## 2021-08-10 DIAGNOSIS — M79641 Pain in right hand: Secondary | ICD-10-CM

## 2021-08-10 NOTE — Therapy (Signed)
[place] [facility] [address], [place], [address] Phone: [phone]   Fax:  [phone]  Occupational Therapy Treatment  Patient Details  Name: Dana Bautista MRN: [id number] Date of Birth: 15-Apr-1946 Referring Provider (OT): Cameron Proud   Encounter Date: 08/10/2021   OT End of Session - 08/10/21 1231     Visit Number 9    Number of Visits 16    Date for OT Re-Evaluation 08/18/21    OT Start Time 1233    OT Stop Time 1309    OT Time Calculation (min) 36 min    Activity Tolerance Patient tolerated treatment well    Behavior During Therapy [facility] for tasks assessed/performed             Past Medical History:  Diagnosis Date   Arthritis    osteoarthriist   Cancer ([place])    skin   Complication of anesthesia    nausea   DVT (deep venous thrombosis) ([place]) 09/2019   Heart murmur    Mitral valve regurgitation    Pulmonary embolism ([facility])    Tendonitis    outer aspect of right foot   UTI (lower urinary tract infection)     Past Surgical History:  Procedure Laterality Date   ABDOMINAL HYSTERECTOMY     APPENDECTOMY     FRACTURE SURGERY     left wrist   FRACTURE SURGERY Left    wrist   INCISION AND DRAINAGE Right 06/03/2021   Procedure: INCISION AND DRAINAGE-Right Index Finger;  Surgeon: Corky Mull, MD;  Location: [facility] ORS;  Service: Orthopedics;  Laterality: Right;   TONSILLECTOMY      There were no vitals filed for this visit.   Subjective Assessment - 08/10/21 1231     Subjective  My motion is better - scar is better - and using hand more but cannot grip  or pinch - thumb pain mostly  because of arthritis    Pertinent History Dana Bautista is a 75 y.o. female who suffered a dog bite and underwent a incision and drainage with Dr. Roland Rack on 06/03/2021. The patient was admitted to the hospital and discharged with oral doxycycline. The patient feels that the swelling and pain continues to decrease in the index  finger. The patient denies any fevers or chills at home. She denies any drainage from the right index finger. The patient has been keeping the finger covered however she has been working on gentle motion and increasing her activities. She does still report moderate stiffness in the index finger. The patient also reports a tingling sensation along the distal fingertip. Pain score today is a 3 out of 10. She presents today for suture removal and advancement of activities. She is scheduled to start formal occupational therapy next week.    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 Yes    Pain Score 3     Pain Location --   thumb   Pain Orientation Right    Pain Descriptors / Indicators Aching;Tender;Tightness    Pain Type Surgical pain    Pain Onset More than a month ago    Pain Frequency Occasional                [facility] OT Assessment - 08/10/21 0001       Strength   Right Hand Grip (lbs)  30    Right Hand Lateral Pinch 8 lbs    Right Hand 3 Point Pinch 6 lbs    Left Hand Grip (lbs) 39    Left Hand Lateral Pinch 11 lbs    Left Hand 3 Point Pinch 10 lbs      Right Hand AROM   R Index  MCP 0-90 90 Degrees    R Index PIP 0-100 100 Degrees    R Index DIP 0-70 60 Degrees                      OT Treatments/Exercises (OP) - 08/10/21 0001       RUE Paraffin   Number Minutes Paraffin 8 Minutes    RUE Paraffin Location Hand    Comments prior to scar tissue , ROm  to decrease pain and stiffness            cont with new silicone sleeve for daytime and night time  Scars progressing very well - not as tender  on scars this date - but still more thick on volar 2nd PIP this date pulling pt into flexion at PIP - tightness with end range extention of PIP Pt can order silicon sleeves on [employer]  Pt to cont doing scar mobs to all scars  - OT focus on volar PIP -using  mini  massager with great success and able to get easy composite extention- end range at 2nd PIP      PROM to [facility] , PIP and DIP done -and composite by OT - pain 2/10 over dorsal digits with ROM   Composite fist to palm Rolling over red roller for pain free extention of PIP  and scar massage still prior to tendon glides Opposition to all digits pain free  5 reps  Pt to cont to hold off on putty on R hand  pain increase in thumb CMC because of OA Provided pt with built grips for brushing and crochet needles  Cont to improve functional use pain free         OT Education - 08/10/21 1231     Education Details HEP, scar massage    Person(s) Educated Patient    Methods Explanation;Demonstration;Tactile cues;Verbal cues;Handout    Comprehension Verbal cues required;Returned demonstration;Verbalized understanding              OT Short Term Goals - 06/23/21 1656       OT SHORT TERM GOAL #1   Title Pt to be independent in HEP to decrease edema, pain and increase AROM in R 2nd digit    Baseline pain 5/10, edema increase 1 cm -and AROM MC 70, PIP 50 , DIP 30    Time 3    Period Weeks    Status New    Target Date 07/13/21               OT Long Term Goals - 06/23/21 1658       OT LONG TERM GOAL #1   Title R 2nd digit AROM increase for pt to touch palm to start using in ADL's with pain less than 2/10    Baseline MC 70, PIP 50 , DIP 30 , extention of PIP -15 -- pain 5/10    Time 5    Period Weeks    Status New    Target Date 07/28/21      OT LONG TERM GOAL #2   Title R grip and prehension strenght increase to more  than 60 % compare to L to carry groceries, turn doorknob and start sewing    Baseline NT - 3 wks s/p - edema nad pain    Time 8    Period Weeks    Status New    Target Date 08/17/21                   Plan - 08/10/21 1232     Clinical Impression Statement Pt present about 9 1/2  wks out from R 2nd digit dog bite with debridement and irrigation of  flexor tenosynovitis( 06/03/21)  - pt this date with AROM to R 2nd digit WNL - 79 MC and 100 PIP flexion- with pull or stretch of 3/10 to touch palm - Scars tenderness improved greatly and this focus on volar 2nd PIP that is pulling pt into PIP flexion. Showed great response to scar massage , full extention and composite flexion. R grip strength increase with only functional use -but lat and 3 point strength limited by thumb CMC pain.  Scars all healed and progressing very well. Pt provided some built up grips for brush, crochet needles to enlarge 3 point grip for functional use and strengthening - pain and strength is limiting her functional use of R dominant hand in ADL's and IADL's- pt can benefit from skilled OT services    OT Occupational Profile and History Problem Focused Assessment - Including review of records relating to presenting problem    Occupational performance deficits (Please refer to evaluation for details): ADL's;IADL's;Work;Play;Leisure;Social Participation    Body Structure / Function / Physical Skills ADL;Coordination;Decreased knowledge of precautions;Skin integrity;Sensation;Flexibility;Scar mobility;IADL;ROM;UE functional use;Edema;Dexterity;Pain;Strength    Rehab Potential Good    Clinical Decision Making Limited treatment options, no task modification necessary    Comorbidities Affecting Occupational Performance: None    Modification or Assistance to Complete Evaluation  No modification of tasks or assist necessary to complete eval    OT Frequency Biweekly    OT Duration 2 weeks    OT Treatment/Interventions Self-care/ADL training;Fluidtherapy;Contrast Bath;Therapeutic exercise;Manual Therapy;Patient/family education;Passive range of motion;Scar mobilization;Compression bandaging;Splinting    Consulted and Agree with Plan of Care Patient             Patient will benefit from skilled therapeutic intervention in order to improve the following deficits and impairments:    Body Structure / Function / Physical Skills: ADL, Coordination, Decreased knowledge of precautions, Skin integrity, Sensation, Flexibility, Scar mobility, IADL, ROM, UE functional use, Edema, Dexterity, Pain, Strength       Visit Diagnosis: Stiffness of right hand, not elsewhere classified  Localized edema  Pain in right hand  Muscle weakness (generalized)    Problem List Patient Active Problem List   Diagnosis Date Noted   Cellulitis 06/04/2021   Cellulitis of right hand 06/03/2021   Dog bite of right hand 06/03/2021   Normocytic anemia 06/03/2021   Flexor tenosynovitis of finger    History of deep vein thrombosis (DVT) of lower extremity 10/20/2020   History of pulmonary embolism 10/20/2020   Lower leg DVT (deep venous thromboembolism), acute, right (HCC) 10/05/2019   Multiple subsegmental pulmonary emboli without acute cor pulmonale (HCC) 10/04/2019   Pericardial effusion 10/04/2019   Pleural effusion 10/04/2019   Vitamin B 12 deficiency 08/10/2017   Arm pain, left 04/25/2017   Anemia due to vitamin B12 deficiency 04/13/2017   Chronic constipation 02/25/2017   Intermittent chest pain 11/20/2016   Nausea 11/20/2016   Right ear impacted cerumen 11/20/2016   Carotid  stenosis, asymptomatic, bilateral 10/06/2016   Dizziness 06/13/2016   H/O mitral valve prolapse 06/13/2016   Neck muscle strain 06/13/2016   Temporal headache 06/13/2016   Vitamin D deficiency, unspecified 12/21/2015   Chronic right-sided low back pain without sciatica 12/16/2015   Meralgia paresthetica of right side 12/16/2015   Prediabetes 12/16/2015   Urge urinary incontinence 12/16/2015    Rosalyn Gess, OTR/L,CLT 08/10/2021, 3:18 PM  [place] [facility] [address] [place], [address] Phone: [phone]   Fax:  [phone]  Name: Emmalia Heyboer MRN: [id number] Date of Birth: [birth year]

## 2021-08-12 ENCOUNTER — Ambulatory Visit: Payer: Medicare Other | Admitting: Occupational Therapy

## 2021-08-24 ENCOUNTER — Ambulatory Visit: Payer: Medicare Other | Admitting: Occupational Therapy

## 2021-08-27 ENCOUNTER — Other Ambulatory Visit: Payer: Self-pay | Admitting: Physician Assistant

## 2021-08-27 ENCOUNTER — Other Ambulatory Visit (HOSPITAL_COMMUNITY): Payer: Self-pay | Admitting: Physician Assistant

## 2021-08-27 ENCOUNTER — Other Ambulatory Visit: Payer: Self-pay

## 2021-08-27 ENCOUNTER — Ambulatory Visit
Admission: RE | Admit: 2021-08-27 | Discharge: 2021-08-27 | Disposition: A | Discharge status: Home / Self Care | Payer: Medicare Other | Source: Ambulatory Visit | Attending: Physician Assistant | Admitting: Physician Assistant

## 2021-08-27 DIAGNOSIS — R109 Unspecified abdominal pain: Secondary | ICD-10-CM

## 2021-08-31 ENCOUNTER — Ambulatory Visit: Payer: Medicare Other | Attending: Student | Admitting: Occupational Therapy

## 2021-08-31 DIAGNOSIS — M6281 Muscle weakness (generalized): Secondary | ICD-10-CM | POA: Diagnosis present

## 2021-08-31 DIAGNOSIS — M79641 Pain in right hand: Secondary | ICD-10-CM | POA: Diagnosis present

## 2021-08-31 DIAGNOSIS — R600 Localized edema: Secondary | ICD-10-CM | POA: Diagnosis present

## 2021-08-31 DIAGNOSIS — M25641 Stiffness of right hand, not elsewhere classified: Secondary | ICD-10-CM | POA: Diagnosis not present

## 2021-08-31 NOTE — Therapy (Signed)
[place] [facility] [address] [place], [place], [address] Phone: [phone]   Fax:  [phone]  Occupational Therapy Treatment  Patient Details  Name: Dana Bautista MRN: [id number] Date of Birth: 07-18-1946 Referring Provider (OT): Cameron Proud   Encounter Date: 08/31/2021   OT End of Session - 08/31/21 1510     Visit Number 10    Number of Visits 13    Date for OT Re-Evaluation 10/26/21    OT Start Time 1448    OT Stop Time 1533    OT Time Calculation (min) 45 min    Activity Tolerance Patient tolerated treatment well    Behavior During Therapy [facility] for tasks assessed/performed             Past Medical History:  Diagnosis Date   Arthritis    osteoarthriist   Cancer ([place])    skin   Complication of anesthesia    nausea   DVT (deep venous thrombosis) ([place]) 09/2019   Heart murmur    Mitral valve regurgitation    Pulmonary embolism ([facility])    Tendonitis    outer aspect of right foot   UTI (lower urinary tract infection)     Past Surgical History:  Procedure Laterality Date   ABDOMINAL HYSTERECTOMY     APPENDECTOMY     FRACTURE SURGERY     left wrist   FRACTURE SURGERY Left    wrist   INCISION AND DRAINAGE Right 06/03/2021   Procedure: INCISION AND DRAINAGE-Right Index Finger;  Surgeon: Corky Mull, MD;  Location: [facility] ORS;  Service: Orthopedics;  Laterality: Right;   TONSILLECTOMY      There were no vitals filed for this visit.   Subjective Assessment - 08/31/21 1510     Subjective  Doing better - using it more and not really pain -except the scar -but stiff in the morning - more strength    Pertinent History Dana Bautista is a 75 y.o. female who suffered a dog bite and underwent a incision and drainage with Dr. Roland Rack on 06/03/2021. The patient was admitted to the hospital and discharged with oral doxycycline. The patient feels that the swelling and pain continues to decrease in the index finger. The  patient denies any fevers or chills at home. She denies any drainage from the right index finger. The patient has been keeping the finger covered however she has been working on gentle motion and increasing her activities. She does still report moderate stiffness in the index finger. The patient also reports a tingling sensation along the distal fingertip. Pain score today is a 3 out of 10. She presents today for suture removal and advancement of activities. She is scheduled to start formal occupational therapy next week.    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 No/denies                [facility] OT Assessment - 08/31/21 0001       Strength   Right Hand Grip (lbs) 34    Right Hand Lateral Pinch 10 lbs    Right Hand 3 Point Pinch 8 lbs    Left Hand Grip (lbs) 39    Left Hand Lateral Pinch 11 lbs    Left Hand 3 Point Pinch 10 lbs      Right Hand AROM   R  Index  MCP 0-90 90 Degrees    R Index PIP 0-100 100 Degrees   -10   R Index DIP 0-70 65 Degrees                      OT Treatments/Exercises (OP) - 08/31/21 0001       RUE Paraffin   Number Minutes Paraffin 8 Minutes    RUE Paraffin Location Hand    Comments prior to scar massage             cScars progressing very well - not as tender  on scars this date - but still more thick on volar 2nd PIP this date pulling pt into flexion at PIP - tightness with end range extention of PIP- PIP extention -10 Pt ordered silicon sleeves on [employer] - ed on wearing Pt to cont doing scar mobs to all scars  - OT focus on volar PIP -using  mini massager with great success and able to get easy composite extention- end range at 2nd PIP Recommend pt to get mini massager -to use at home for month        Composite fist to palm AROM - no pain  Rolling over red roller for pain free extention of PIP  and scar massage still prior to  tendon glides Opposition to all digits pain free  5 reps  Provided pt with built grips for brushing and crochet needles last time Cont to improve functionally and use pain free        OT Education - 08/31/21 1510     Education Details HEP, scar massage    Person(s) Educated Patient    Methods Explanation;Demonstration;Tactile cues;Verbal cues;Handout    Comprehension Verbal cues required;Returned demonstration;Verbalized understanding              OT Short Term Goals - 08/31/21 1729       OT SHORT TERM GOAL #1   Title Pt to be independent in HEP to decrease edema, pain and increase AROM in R 2nd digit    Status Achieved               OT Long Term Goals - 08/31/21 1729       OT LONG TERM GOAL #1   Title R 2nd digit AROM increase for pt to touch palm to start using in ADL's with pain less than 2/10    Baseline MC 70, PIP 50 , DIP 30 , extention of PIP -15 -- pain 5/10  - no pain with flexion- touch palm MC 90, PIP 100 -and PIP extention -10    Status Achieved      OT LONG TERM GOAL #2   Title R grip and prehension strenght increase to more than 60 % compare to L to carry groceries, turn doorknob and start sewing    Baseline NT - 3 wks s/p - edema nad pain- Improved greatly and [facility]    Status Achieved      OT LONG TERM GOAL #3   Title Scar tissue improve for pt to show full PIP extention at 2nd digit to donn gloves and push buttons    Baseline scar volar PIP and ulnar proximal phalanges - thick - pulling digits in some flexion- -10 at PIP    Time 8    Period Weeks    Status New    Target Date 10/26/21  Plan - 08/31/21 1529     Clinical Impression Statement Pt now 12 wks s/p R 2nd digit dog bite with debridement and irrigation of flexor tenosynovitis( 06/03/21)  -AROM to R 2nd digit WNL - flexion 90 MC and 100 PIP flexion- no pain - mostly stiffness but  with  -10 PIP extention - Scars tenderness improved greatly and  focus mostly on   volar 2nd PIP that is pulling pt into PIP flexion and ulnar scar at proximal phalange . Showed great response to scar massage , full extention and composite flexion. Pt showed great progress in R grip and prehension  strength with increase functional use - pt is limited by thumb CMC pain. Pt to focus on scar mobs, extention of 2nd PIP and maintain flexion for about month. Still limited in use of  R dominant hand in ADL's and IADL's- pt can benefit from skilled OT services    OT Occupational Profile and History Problem Focused Assessment - Including review of records relating to presenting problem    Occupational performance deficits (Please refer to evaluation for details): ADL's;IADL's;Work;Play;Leisure;Social Participation    Body Structure / Function / Physical Skills ADL;Coordination;Decreased knowledge of precautions;Skin integrity;Sensation;Flexibility;Scar mobility;IADL;ROM;UE functional use;Edema;Dexterity;Pain;Strength    Rehab Potential Good    Clinical Decision Making Limited treatment options, no task modification necessary    Comorbidities Affecting Occupational Performance: None    Modification or Assistance to Complete Evaluation  No modification of tasks or assist necessary to complete eval    OT Frequency Monthly    OT Duration 8 weeks    OT Treatment/Interventions Self-care/ADL training;Fluidtherapy;Contrast Bath;Therapeutic exercise;Manual Therapy;Patient/family education;Passive range of motion;Scar mobilization;Compression bandaging;Splinting    Consulted and Agree with Plan of Care Patient             Patient will benefit from skilled therapeutic intervention in order to improve the following deficits and impairments:   Body Structure / Function / Physical Skills: ADL, Coordination, Decreased knowledge of precautions, Skin integrity, Sensation, Flexibility, Scar mobility, IADL, ROM, UE functional use, Edema, Dexterity, Pain, Strength       Visit Diagnosis: Stiffness of  right hand, not elsewhere classified - Plan: Ot plan of care cert/re-cert  Localized edema - Plan: Ot plan of care cert/re-cert  Pain in right hand - Plan: Ot plan of care cert/re-cert  Muscle weakness (generalized) - Plan: Ot plan of care cert/re-cert    Problem List Patient Active Problem List   Diagnosis Date Noted   Cellulitis 06/04/2021   Cellulitis of right hand 06/03/2021   Dog bite of right hand 06/03/2021   Normocytic anemia 06/03/2021   Flexor tenosynovitis of finger    History of deep vein thrombosis (DVT) of lower extremity 10/20/2020   History of pulmonary embolism 10/20/2020   Lower leg DVT (deep venous thromboembolism), acute, right ([place]) 10/05/2019   Multiple subsegmental pulmonary emboli without acute cor pulmonale (HCC) 10/04/2019   Pericardial effusion 10/04/2019   Pleural effusion 10/04/2019   Vitamin B 12 deficiency 08/10/2017   Arm pain, left 04/25/2017   Anemia due to vitamin B12 deficiency 04/13/2017   Chronic constipation 02/25/2017   Intermittent chest pain 11/20/2016   Nausea 11/20/2016   Right ear impacted cerumen 11/20/2016   Carotid stenosis, asymptomatic, bilateral 10/06/2016   Dizziness 06/13/2016   H/O mitral valve prolapse 06/13/2016   Neck muscle strain 06/13/2016   Temporal headache 06/13/2016   Vitamin D deficiency, unspecified 12/21/2015   Chronic right-sided low back pain without sciatica 12/16/2015   Meralgia  paresthetica of right side 12/16/2015   Prediabetes 12/16/2015   Urge urinary incontinence 12/16/2015    Rosalyn Gess, OTR/L,CLT 08/31/2021, 5:34 PM  [place] [facility] [address] [place], [address] Phone: [phone]   Fax:  [phone]  Name: Osiris Charles MRN: [id number] Date of Birth: 25-Sep-1946

## 2021-10-05 ENCOUNTER — Ambulatory Visit: Payer: Medicare Other | Attending: Student | Admitting: Occupational Therapy

## 2021-10-05 DIAGNOSIS — M79641 Pain in right hand: Secondary | ICD-10-CM | POA: Diagnosis present

## 2021-10-05 DIAGNOSIS — M25641 Stiffness of right hand, not elsewhere classified: Secondary | ICD-10-CM | POA: Insufficient documentation

## 2021-10-05 DIAGNOSIS — M6281 Muscle weakness (generalized): Secondary | ICD-10-CM | POA: Diagnosis present

## 2021-10-05 DIAGNOSIS — R600 Localized edema: Secondary | ICD-10-CM | POA: Insufficient documentation

## 2021-10-05 NOTE — Therapy (Signed)
[facility] [address], [place], [address] Phone: [phone]   Fax:  [phone]  Occupational Therapy Treatment  Patient Details  Name: Dana Bautista MRN: [id number] Date of Birth: [birth year] Referring Provider (OT): Cameron Proud   Encounter Date: 10/05/2021   OT End of Session - 10/05/21 1251     Visit Number 11    Number of Visits 13    Date for OT Re-Evaluation 10/26/21    OT Start Time 1210    OT Stop Time 1246    OT Time Calculation (min) 36 min    Activity Tolerance Patient tolerated treatment well    Behavior During Therapy [facility] for tasks assessed/performed             Past Medical History:  Diagnosis Date   Arthritis    osteoarthriist   Cancer ([place])    skin   Complication of anesthesia    nausea   DVT (deep venous thrombosis) ([place]) 09/2019   Heart murmur    Mitral valve regurgitation    Pulmonary embolism ([facility])    Tendonitis    outer aspect of right foot   UTI (lower urinary tract infection)     Past Surgical History:  Procedure Laterality Date   ABDOMINAL HYSTERECTOMY     APPENDECTOMY     FRACTURE SURGERY     left wrist   FRACTURE SURGERY Left    wrist   INCISION AND DRAINAGE Right 06/03/2021   Procedure: INCISION AND DRAINAGE-Right Index Finger;  Surgeon: Corky Mull, MD;  Location: [facility] ORS;  Service: Orthopedics;  Laterality: Right;   TONSILLECTOMY      There were no vitals filed for this visit.   Subjective Assessment - 10/05/21 1249     Subjective  My finger is better - but stiff in the morning - my massager broke that I ordered- my thumb is bothering me more than my index fingers    Pertinent History Dana Bautista is a 76 y.o. female who suffered a dog bite and underwent a incision and drainage with Dr. Roland Rack on 06/03/2021. The patient was admitted to the hospital and discharged with oral doxycycline. The patient feels that the swelling and pain continues to decrease in  the index finger. The patient denies any fevers or chills at home. She denies any drainage from the right index finger. The patient has been keeping the finger covered however she has been working on gentle motion and increasing her activities. She does still report moderate stiffness in the index finger. The patient also reports a tingling sensation along the distal fingertip. Pain score today is a 3 out of 10. She presents today for suture removal and advancement of activities. She is scheduled to start formal occupational therapy next week.    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 No/denies                [facility] OT Assessment - 10/05/21 0001       Right Hand AROM   R Index  MCP 0-90 90 Degrees    R Index PIP 0-100 100 Degrees   0   R Index DIP 0-70 65 Degrees                      OT Treatments/Exercises (OP) - 10/05/21 0001  RUE Paraffin   Number Minutes Paraffin 8 Minutes    RUE Paraffin Location Hand    Comments prior to scar massage             Scars progressing very well - not as tender  on scars t - but still more thick on volar 2nd PIP but improved more than ulnar scar on proximal phalanges on 2nd - distal part tight and adhere - pt ed and review to do scar massage - can use coban piece or get another mini massager Pt to cont doing scar mobs to all scars  - OT focus on vPIP extention and composite flexion of 2nd digit  Recommend pt to get mini massager -to use at home for month        Composite fist to palm AROM - no pain  Rolling over red roller for pain free extention of PIP  and scar massage still prior to tendon glides Opposition to all digits pain free  5 reps  Cont to improve functionally and use pain free  Limited mostly in strength grip and prehension - and 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        OT Education -  10/05/21 1251     Education Details HEP, scar massage    Person(s) Educated Patient    Methods Explanation;Demonstration;Tactile cues;Verbal cues;Handout    Comprehension Verbal cues required;Returned demonstration;Verbalized understanding              OT Short Term Goals - 08/31/21 1729       OT SHORT TERM GOAL #1   Title Pt to be independent in HEP to decrease edema, pain and increase AROM in R 2nd digit    Status Achieved               OT Long Term Goals - 08/31/21 1729       OT LONG TERM GOAL #1   Title R 2nd digit AROM increase for pt to touch palm to start using in ADL's with pain less than 2/10    Baseline MC 70, PIP 50 , DIP 30 , extention of PIP -15 -- pain 5/10  - no pain with flexion- touch palm MC 90, PIP 100 -and PIP extention -10    Status Achieved      OT LONG TERM GOAL #2   Title R grip and prehension strenght increase to more than 60 % compare to L to carry groceries, turn doorknob and start sewing    Baseline NT - 3 wks s/p - edema nad pain- Improved greatly and [facility]    Status Achieved      OT LONG TERM GOAL #3   Title Scar tissue improve for pt to show full PIP extention at 2nd digit to donn gloves and push buttons    Baseline scar volar PIP and ulnar proximal phalanges - thick - pulling digits in some flexion- -10 at PIP    Time 8    Period Weeks    Status New    Target Date 10/26/21                   Plan - 10/05/21 1252     Clinical Impression Statement Pt about 4 months s/p R 2nd digit dog bite with debridement and irrigation of flexor tenosynovitis( 06/03/21)  -AROM to R 2nd digit WNL - flexion 90 MC and 100 PIP flexion- no pain - mostly stiffness and full PIP extention.  Scars tenderness  improved greatly and  focus mostly this date on volar 2nd PIP that  improved but did appear to have adhesion on the distal part of the ulnar scar at proximal phalange . Pt ed on scar mobs to be done this next month and to cont to maintain full  extention and composite flexion. Pt grip and prehension  strength  limited by CMC thumb pain, but she is using it more functional. Pt to focus on scar mobs, extention of 2nd PIP and maintain flexion for about month. Pt can benefit from skilled OT services    OT Occupational Profile and History Problem Focused Assessment - Including review of records relating to presenting problem    Occupational performance deficits (Please refer to evaluation for details): ADL's;IADL's;Work;Play;Leisure;Social Participation    Body Structure / Function / Physical Skills ADL;Coordination;Decreased knowledge of precautions;Skin integrity;Sensation;Flexibility;Scar mobility;IADL;ROM;UE functional use;Edema;Dexterity;Pain;Strength    Rehab Potential Good    Clinical Decision Making Limited treatment options, no task modification necessary    Comorbidities Affecting Occupational Performance: None    Modification or Assistance to Complete Evaluation  No modification of tasks or assist necessary to complete eval    OT Frequency Monthly    OT Duration 4 weeks    OT Treatment/Interventions Self-care/ADL training;Fluidtherapy;Contrast Bath;Therapeutic exercise;Manual Therapy;Patient/family education;Passive range of motion;Scar mobilization;Compression bandaging;Splinting    Consulted and Agree with Plan of Care Patient             Patient will benefit from skilled therapeutic intervention in order to improve the following deficits and impairments:   Body Structure / Function / Physical Skills: ADL, Coordination, Decreased knowledge of precautions, Skin integrity, Sensation, Flexibility, Scar mobility, IADL, ROM, UE functional use, Edema, Dexterity, Pain, Strength       Visit Diagnosis: Stiffness of right hand, not elsewhere classified  Localized edema  Pain in right hand  Muscle weakness (generalized)    Problem List Patient Active Problem List   Diagnosis Date Noted   Cellulitis 06/04/2021    Cellulitis of right hand 06/03/2021   Dog bite of right hand 06/03/2021   Normocytic anemia 06/03/2021   Flexor tenosynovitis of finger    History of deep vein thrombosis (DVT) of lower extremity 10/20/2020   History of pulmonary embolism 10/20/2020   Lower leg DVT (deep venous thromboembolism), acute, right (HCC) 10/05/2019   Multiple subsegmental pulmonary emboli without acute cor pulmonale (HCC) 10/04/2019   Pericardial effusion 10/04/2019   Pleural effusion 10/04/2019   Vitamin B 12 deficiency 08/10/2017   Arm pain, left 04/25/2017   Anemia due to vitamin B12 deficiency 04/13/2017   Chronic constipation 02/25/2017   Intermittent chest pain 11/20/2016   Nausea 11/20/2016   Right ear impacted cerumen 11/20/2016   Carotid stenosis, asymptomatic, bilateral 10/06/2016   Dizziness 06/13/2016   H/O mitral valve prolapse 06/13/2016   Neck muscle strain 06/13/2016   Temporal headache 06/13/2016   Vitamin D deficiency, unspecified 12/21/2015   Chronic right-sided low back pain without sciatica 12/16/2015   Meralgia paresthetica of right side 12/16/2015   Prediabetes 12/16/2015   Urge urinary incontinence 12/16/2015    Rosalyn Gess, OTR/L,CLT 10/05/2021, 12:56 PM  [facility] [address], [place], [address] Phone: [phone]   Fax:  [phone]  Name: Dana Bautista MRN: [id number] Date of Birth: Oct 08, 1945

## 2021-10-22 ENCOUNTER — Other Ambulatory Visit (HOSPITAL_COMMUNITY): Payer: Self-pay | Admitting: Family Medicine

## 2021-10-22 ENCOUNTER — Other Ambulatory Visit: Payer: Self-pay | Admitting: Family Medicine

## 2021-10-22 DIAGNOSIS — M5416 Radiculopathy, lumbar region: Secondary | ICD-10-CM

## 2021-11-01 ENCOUNTER — Ambulatory Visit: Payer: TRICARE For Life (TFL)

## 2021-11-02 ENCOUNTER — Ambulatory Visit: Payer: Medicare Other | Attending: Student | Admitting: Occupational Therapy

## 2021-11-02 ENCOUNTER — Other Ambulatory Visit: Payer: Self-pay

## 2021-11-02 DIAGNOSIS — M25641 Stiffness of right hand, not elsewhere classified: Secondary | ICD-10-CM | POA: Diagnosis not present

## 2021-11-02 DIAGNOSIS — M79641 Pain in right hand: Secondary | ICD-10-CM | POA: Diagnosis present

## 2021-11-02 DIAGNOSIS — R600 Localized edema: Secondary | ICD-10-CM | POA: Diagnosis present

## 2021-11-02 DIAGNOSIS — M6281 Muscle weakness (generalized): Secondary | ICD-10-CM | POA: Diagnosis present

## 2021-11-02 NOTE — Therapy (Signed)
[facility] [address] [place], [place], [address] Phone: [phone]   Fax:  [phone]  Occupational Therapy Treatment/discharge  Patient Details  Name: Dana Bautista MRN: [id number] Date of Birth: 1946-06-12 Referring Provider (OT): Cameron Proud   Encounter Date: 11/02/2021   OT End of Session - 11/02/21 1144     Visit Number 12    Number of Visits 12    Date for OT Re-Evaluation 11/02/21    OT Start Time 1115    OT Stop Time 1140    OT Time Calculation (min) 25 min    Activity Tolerance Patient tolerated treatment well    Behavior During Therapy [facility] for tasks assessed/performed             Past Medical History:  Diagnosis Date   Arthritis    osteoarthriist   Cancer ([place])    skin   Complication of anesthesia    nausea   DVT (deep venous thrombosis) ([place]) 09/2019   Heart murmur    Mitral valve regurgitation    Pulmonary embolism (HCC)    Tendonitis    outer aspect of right foot   UTI (lower urinary tract infection)     Past Surgical History:  Procedure Laterality Date   ABDOMINAL HYSTERECTOMY     APPENDECTOMY     FRACTURE SURGERY     left wrist   FRACTURE SURGERY Left    wrist   INCISION AND DRAINAGE Right 06/03/2021   Procedure: INCISION AND DRAINAGE-Right Index Finger;  Surgeon: Corky Mull, MD;  Location: [facility] ORS;  Service: Orthopedics;  Laterality: Right;   TONSILLECTOMY      There were no vitals filed for this visit.   Subjective Assessment - 11/02/21 1143     Subjective  My finger is fine - doing much better - some what stiff in the am but it is my thumb that really hurts so bad    Pertinent History Dana Bautista is a 76 y.o. female who suffered a dog bite and underwent a incision and drainage with Dr. Roland Rack on 06/03/2021. The patient was admitted to the hospital and discharged with oral doxycycline. The patient feels that the swelling and pain continues to decrease in the index finger.  The patient denies any fevers or chills at home. She denies any drainage from the right index finger. The patient has been keeping the finger covered however she has been working on gentle motion and increasing her activities. She does still report moderate stiffness in the index finger. The patient also reports a tingling sensation along the distal fingertip. Pain score today is a 3 out of 10. She presents today for suture removal and advancement of activities. She is scheduled to start formal occupational therapy next week.    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 Yes    Pain Score 8     Pain Location --   Thumb   Pain Orientation Right    Pain Descriptors / Indicators Sharp;Sore;Aching    Pain Type Chronic pain                [facility] OT Assessment - 11/02/21 0001       Strength   Right Hand Lateral Pinch 9 lbs   Thumb pain   Right Hand 3 Point Pinch 6 lbs   Thumb  pain     Right Hand AROM   R Index  MCP 0-90 90 Degrees    R Index PIP 0-100 100 Degrees   0  ext   R Index DIP 0-70 70 Degrees                Scars progressing very well - not as tender  on scars  Pt cont doing scar mobs to all scars   Pt AROM for 2nd digit this date WNL - 27 MC', PIP 100 and DIP 70        Composite fist to palm AROM - no pain  Limited mostly in strength grip and prehension - and 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  Fitted with CMC neoprene splint and recommend paraffin bath - as well joint protection discuss                  OT Education - 11/02/21 1144     Education Details discharge instruciton    Person(s) Educated Patient    Methods Explanation;Demonstration;Tactile cues;Verbal cues;Handout    Comprehension Verbal cues required;Returned demonstration;Verbalized understanding              OT Short Term Goals - 08/31/21 1729       OT SHORT TERM  GOAL #1   Title Pt to be independent in HEP to decrease edema, pain and increase AROM in R 2nd digit    Status Achieved               OT Long Term Goals - 11/02/21 1147       OT LONG TERM GOAL #1   Title R 2nd digit AROM increase for pt to touch palm to start using in ADL's with pain less than 2/10    Status Achieved      OT LONG TERM GOAL #2   Title R grip and prehension strenght increase to more than 60 % compare to L to carry groceries, turn doorknob and start sewing    Baseline NT - 3 wks s/p - edema nad pain- Improved greatly and [facility]    Status Achieved      OT LONG TERM GOAL #3   Title Scar tissue improve for pt to show full PIP extention at 2nd digit to donn gloves and push buttons    Baseline AROM WNL for lfexio nand extention - no pain    Status Achieved                   Plan - 11/02/21 1145     Clinical Impression Statement Pt about 4 months s/p R 2nd digit dog bite with debridement and irrigation of flexor tenosynovitis( 06/03/21)  - Pt 5 months s/p and here for month follow up - report stiffness in the am but other wise doing much better- using it normally but limited by thumb CMC OA pain - 2nd digit AROM WNL for flexion and extnetion  Scars tenderness improved greatly. Pt grip and prehension  strength  limited by CMC thumb pain, but she is using it more functional. Fitted pt with CMC neoprene splint to use with her crafts and recommend paraffin bath to use for pain and stiffness. Pt met all goals and discharge at this time    OT Occupational Profile and History Problem Focused Assessment - Including review of records relating to presenting problem    Occupational performance deficits (Please refer to evaluation for details): ADL's;IADL's;Work;Play;Leisure;Social Participation    Body Structure /  Function / Physical Skills ADL;Coordination;Decreased knowledge of precautions;Skin integrity;Sensation;Flexibility;Scar mobility;IADL;ROM;UE functional  use;Edema;Dexterity;Pain;Strength    Rehab Potential Good    Clinical Decision Making Limited treatment options, no task modification necessary    Comorbidities Affecting Occupational Performance: None    Modification or Assistance to Complete Evaluation  No modification of tasks or assist necessary to complete eval    OT Treatment/Interventions Self-care/ADL training;Fluidtherapy;Contrast Bath;Therapeutic exercise;Manual Therapy;Patient/family education;Passive range of motion;Scar mobilization;Compression bandaging;Splinting    Consulted and Agree with Plan of Care Patient             Patient will benefit from skilled therapeutic intervention in order to improve the following deficits and impairments:   Body Structure / Function / Physical Skills: ADL, Coordination, Decreased knowledge of precautions, Skin integrity, Sensation, Flexibility, Scar mobility, IADL, ROM, UE functional use, Edema, Dexterity, Pain, Strength       Visit Diagnosis: Stiffness of right hand, not elsewhere classified - Plan: Ot plan of care cert/re-cert  Localized edema - Plan: Ot plan of care cert/re-cert  Pain in right hand - Plan: Ot plan of care cert/re-cert  Muscle weakness (generalized) - Plan: Ot plan of care cert/re-cert    Problem List Patient Active Problem List   Diagnosis Date Noted   Cellulitis 06/04/2021   Cellulitis of right hand 06/03/2021   Dog bite of right hand 06/03/2021   Normocytic anemia 06/03/2021   Flexor tenosynovitis of finger    History of deep vein thrombosis (DVT) of lower extremity 10/20/2020   History of pulmonary embolism 10/20/2020   Lower leg DVT (deep venous thromboembolism), acute, right ([place]) 10/05/2019   Multiple subsegmental pulmonary emboli without acute cor pulmonale (HCC) 10/04/2019   Pericardial effusion 10/04/2019   Pleural effusion 10/04/2019   Vitamin B 12 deficiency 08/10/2017   Arm pain, left 04/25/2017   Anemia due to vitamin B12 deficiency  04/13/2017   Chronic constipation 02/25/2017   Intermittent chest pain 11/20/2016   Nausea 11/20/2016   Right ear impacted cerumen 11/20/2016   Carotid stenosis, asymptomatic, bilateral 10/06/2016   Dizziness 06/13/2016   H/O mitral valve prolapse 06/13/2016   Neck muscle strain 06/13/2016   Temporal headache 06/13/2016   Vitamin D deficiency, unspecified 12/21/2015   Chronic right-sided low back pain without sciatica 12/16/2015   Meralgia paresthetica of right side 12/16/2015   Prediabetes 12/16/2015   Urge urinary incontinence 12/16/2015    Rosalyn Gess, OTR/L,CLT 11/02/2021, 11:50 AM  [facility] [address], [place], [address] Phone: [phone]   Fax:  [phone]  Name: Dana Bautista MRN: [id number] Date of Birth: 18-Nov-1945

## 2021-11-09 ENCOUNTER — Other Ambulatory Visit: Payer: Self-pay

## 2021-11-09 ENCOUNTER — Ambulatory Visit
Admission: RE | Admit: 2021-11-09 | Discharge: 2021-11-09 | Disposition: A | Discharge status: Home / Self Care | Payer: Medicare Other | Source: Ambulatory Visit | Attending: Family Medicine | Admitting: Family Medicine

## 2021-11-09 DIAGNOSIS — M5416 Radiculopathy, lumbar region: Secondary | ICD-10-CM | POA: Insufficient documentation

## 2021-11-30 ENCOUNTER — Other Ambulatory Visit: Payer: Medicare Other

## 2021-12-02 ENCOUNTER — Ambulatory Visit: Payer: Medicare Other | Admitting: Oncology

## 2021-12-13 ENCOUNTER — Other Ambulatory Visit: Payer: Self-pay

## 2021-12-13 ENCOUNTER — Inpatient Hospital Stay: Payer: Medicare Other | Attending: Oncology

## 2021-12-13 DIAGNOSIS — Z801 Family history of malignant neoplasm of trachea, bronchus and lung: Secondary | ICD-10-CM | POA: Insufficient documentation

## 2021-12-13 DIAGNOSIS — I872 Venous insufficiency (chronic) (peripheral): Secondary | ICD-10-CM | POA: Diagnosis not present

## 2021-12-13 DIAGNOSIS — Z803 Family history of malignant neoplasm of breast: Secondary | ICD-10-CM | POA: Diagnosis not present

## 2021-12-13 DIAGNOSIS — Z7901 Long term (current) use of anticoagulants: Secondary | ICD-10-CM | POA: Diagnosis not present

## 2021-12-13 DIAGNOSIS — M7989 Other specified soft tissue disorders: Secondary | ICD-10-CM | POA: Insufficient documentation

## 2021-12-13 DIAGNOSIS — Z86711 Personal history of pulmonary embolism: Secondary | ICD-10-CM | POA: Diagnosis not present

## 2021-12-13 DIAGNOSIS — Z86718 Personal history of other venous thrombosis and embolism: Secondary | ICD-10-CM | POA: Insufficient documentation

## 2021-12-13 DIAGNOSIS — Z90710 Acquired absence of both cervix and uterus: Secondary | ICD-10-CM | POA: Insufficient documentation

## 2021-12-13 DIAGNOSIS — Z808 Family history of malignant neoplasm of other organs or systems: Secondary | ICD-10-CM | POA: Insufficient documentation

## 2021-12-13 DIAGNOSIS — D649 Anemia, unspecified: Secondary | ICD-10-CM | POA: Diagnosis not present

## 2021-12-13 LAB — COMPREHENSIVE METABOLIC PANEL
ALT: 10 U/L (ref 0–44)
AST: 14 U/L — ABNORMAL LOW (ref 15–41)
Albumin: 3.9 g/dL (ref 3.5–5.0)
Alkaline Phosphatase: 56 U/L (ref 38–126)
Anion gap: 7 (ref 5–15)
BUN: 16 mg/dL (ref 8–23)
CO2: 27 mmol/L (ref 22–32)
Calcium: 9.1 mg/dL (ref 8.9–10.3)
Chloride: 102 mmol/L (ref 98–111)
Creatinine, Ser: 0.63 mg/dL (ref 0.44–1.00)
GFR, Estimated: >60 mL/min (ref >60)
Glucose, Bld: 97 mg/dL (ref 70–99)
Potassium: 3.9 mmol/L (ref 3.5–5.1)
Sodium: 136 mmol/L (ref 135–145)
Total Bilirubin: 0.7 mg/dL (ref 0.3–1.2)
Total Protein: 7 g/dL (ref 6.5–8.1)

## 2021-12-13 LAB — CBC WITH DIFFERENTIAL/PLATELET
Abs Immature Granulocytes: 0.01 10*3/uL (ref 0.00–0.07)
Basophils Absolute: 0 10*3/uL (ref 0.0–0.1)
Basophils Relative: 0 %
Eosinophils Absolute: 0.1 10*3/uL (ref 0.0–0.5)
Eosinophils Relative: 2 %
HCT: 35.9 % — ABNORMAL LOW (ref 36.0–46.0)
Hemoglobin: 11.6 g/dL — ABNORMAL LOW (ref 12.0–15.0)
Immature Granulocytes: 0 %
Lymphocytes Relative: 36 %
Lymphs Abs: 2.4 10*3/uL (ref 0.7–4.0)
MCH: 28 pg (ref 26.0–34.0)
MCHC: 32.3 g/dL (ref 30.0–36.0)
MCV: 86.7 fL (ref 80.0–100.0)
Monocytes Absolute: 0.6 10*3/uL (ref 0.1–1.0)
Monocytes Relative: 9 %
Neutro Abs: 3.6 10*3/uL (ref 1.7–7.7)
Neutrophils Relative %: 53 %
Platelets: 263 10*3/uL (ref 150–400)
RBC: 4.14 MIL/uL (ref 3.87–5.11)
RDW: 13.4 % (ref 11.5–15.5)
WBC: 6.7 10*3/uL (ref 4.0–10.5)
nRBC: 0 % (ref 0.0–0.2)

## 2021-12-15 ENCOUNTER — Encounter: Payer: Self-pay | Admitting: Oncology

## 2021-12-15 ENCOUNTER — Other Ambulatory Visit: Payer: Self-pay

## 2021-12-15 ENCOUNTER — Inpatient Hospital Stay (HOSPITAL_BASED_OUTPATIENT_CLINIC_OR_DEPARTMENT_OTHER): Payer: Medicare Other | Admitting: Oncology

## 2021-12-15 VITALS — BP 108/93 | HR 67 | Temp 97.3°F | Wt 213.0 lb

## 2021-12-15 DIAGNOSIS — Z86718 Personal history of other venous thrombosis and embolism: Secondary | ICD-10-CM | POA: Diagnosis not present

## 2021-12-15 DIAGNOSIS — D649 Anemia, unspecified: Secondary | ICD-10-CM | POA: Diagnosis not present

## 2021-12-15 MED ORDER — APIXABAN 2.5 MG PO TABS: 2.5000 mg | ORAL_TABLET | Freq: Two times a day (BID) | ORAL | 3 refills | Status: DC

## 2021-12-15 NOTE — Progress Notes (Signed)
Chronic ?Hematology/Oncology Progress note ?[phone] [phone] ?  ? ? ? ?Patient Care Team: ?Marinda Elk, MD as PCP - General (Physician Assistant) ? ?REFERRING PROVIDER: ?Marinda Elk, MD  ?CHIEF COMPLAINTS/REASON FOR VISIT:  ?Follow up for pulmonary  embolism ? ?HISTORY OF PRESENTING ILLNESS:  ? ?Dana Bautista is a  76 y.o.  female with PMH listed below was seen in consultation at the request of  Marinda Elk, MD  for evaluation of pulmonary embolism ? ?Patient was admitted from 10/04/2019-10/05/2019 due to acute bilateral pulmonary embolism and DVT.  Patient denies any immobilization factors prior to the events.  Her initial symptom was sudden onset of left-sided chest pain. ?10/04/2019, CT chest angiogram showed acute bilateral lower lobe pulmonary embolism.  No right heart strain.  Small left pleural effusion. ?10/05/2019 bilateral lower extremity ultrasound showed right calf posterior calf occlusive DVT.  Very low thrombus burden.  Patient was started on anticoagulation and discharged on Eliquis. ?She has been on anticoagulation for slightly more than 6 months. ?She was referred to hematology for evaluation management. ?Patient reports that the left chest pain has completely resolved.  She was less active since the diagnosis of pulmonary embolism and has been deconditioned.  She has tried to exercise more and her exercise endurance has improved. ?Intermittently she experienced left lower extremity swelling. ?She denies any constitutional symptoms. ? ?July 2021 -switched to Eliquis 2.'5mg'$  BID ? ?INTERVAL HISTORY ?Dana Bautista is a 76 y.o. female who has above history reviewed by me today presents for follow up visit for management of history of thrombosis.  ?Patient has been on Eliquis 2.5 mg twice daily.  She tolerates well. ?No bleeding events. ? ?Review of Systems  ?Constitutional:  Negative for appetite change, chills, fatigue and fever.  ?HENT:   Negative for hearing loss  and voice change.   ?Eyes:  Negative for eye problems.  ?Respiratory:  Negative for chest tightness and cough.   ?Cardiovascular:  Negative for chest pain.  ?Gastrointestinal:  Negative for abdominal distention, abdominal pain and blood in stool.  ?Endocrine: Negative for hot flashes.  ?Genitourinary:  Negative for difficulty urinating and frequency.   ?Musculoskeletal:  Negative for arthralgias.  ?Skin:  Negative for itching and rash.  ?Neurological:  Negative for extremity weakness.  ?Hematological:  Negative for adenopathy.  ?Psychiatric/Behavioral:  Negative for confusion.   ? ?MEDICAL HISTORY:  ?Past Medical History:  ?Diagnosis Date  ? Arthritis   ? osteoarthriist  ? Cancer ([place])   ? skin  ? Complication of anesthesia   ? nausea  ? DVT (deep venous thrombosis) ([place]) 09/2019  ? Heart murmur   ? Mitral valve regurgitation   ? Pulmonary embolism ([place])   ? Tendonitis   ? outer aspect of right foot  ? UTI (lower urinary tract infection)   ? ? ?SURGICAL HISTORY: ?Past Surgical History:  ?Procedure Laterality Date  ? ABDOMINAL HYSTERECTOMY    ? APPENDECTOMY    ? FRACTURE SURGERY    ? left wrist  ? FRACTURE SURGERY Left   ? wrist  ? INCISION AND DRAINAGE Right 06/03/2021  ? Procedure: INCISION AND DRAINAGE-Right Index Finger;  Surgeon: Corky Mull, MD;  Location: [facility] ORS;  Service: Orthopedics;  Laterality: Right;  ? TONSILLECTOMY    ? ? ?SOCIAL HISTORY: ?Social History  ? ?Socioeconomic History  ? Marital status: Married  ?  Spouse name: Not on file  ? Number of children: Not on file  ? Years of education: Not  on file  ? Highest education level: Not on file  ?Occupational History  ? Not on file  ?Tobacco Use  ? Smoking status: Never  ? Smokeless tobacco: Never  ?Vaping Use  ? Vaping Use: Never used  ?Substance and Sexual Activity  ? Alcohol use: No  ? Drug use: No  ? Sexual activity: Not on file  ?Other Topics Concern  ? Not on file  ?Social History Narrative  ? Not on file  ? ?Social Determinants of Health   ? ?Financial Resource Strain: Not on file  ?Food Insecurity: Not on file  ?Transportation Needs: Not on file  ?Physical Activity: Not on file  ?Stress: Not on file  ?Social Connections: Not on file  ?Intimate Partner Violence: Not on file  ? ? ?FAMILY HISTORY: ?Family History  ?Problem Relation Age of Onset  ? Breast cancer Mother   ? Asthma Mother   ? Lung cancer Father   ? Heart disease Brother   ? Heart disease Maternal Grandfather   ? Throat cancer Maternal Grandfather   ? ? ?ALLERGIES:  is allergic to codeine, elemental sulfur, levaquin [levofloxacin], penicillins, sulfa antibiotics, and latex. ? ?MEDICATIONS:  ?Current Outpatient Medications  ?Medication Sig Dispense Refill  ? cetirizine (ZYRTEC) 10 MG tablet     ? cyanocobalamin (,VITAMIN B-12,) 1000 MCG/ML injection Inject 1,000 mcg into the muscle every 30 (thirty) days.    ? diclofenac Sodium (VOLTAREN) 1 % GEL     ? doxycycline (VIBRA-TABS) 100 MG tablet Take 1 tablet (100 mg total) by mouth every 12 (twelve) hours. 14 tablet 0  ? ondansetron (ZOFRAN) 4 MG tablet Take 1 tablet (4 mg total) by mouth every 6 (six) hours as needed for nausea. 20 tablet 0  ? pantoprazole (PROTONIX) 40 MG tablet Take 1 tablet (40 mg total) by mouth daily. 15 tablet 0  ? traMADol (ULTRAM) 50 MG tablet Take 1 tablet (50 mg total) by mouth every 6 (six) hours as needed for moderate pain. 20 tablet 0  ? apixaban (ELIQUIS) 2.5 MG TABS tablet Take 1 tablet (2.5 mg total) by mouth 2 (two) times daily. 180 tablet 3  ? ?No current facility-administered medications for this visit.  ? ? ? ?PHYSICAL EXAMINATION: ?ECOG PERFORMANCE STATUS: 0 - Asymptomatic ?Vitals:  ? 12/15/21 1112  ?BP: (!) 108/93  ?Pulse: 67  ?Temp: (!) 97.3 ?F (36.3 ?C)  ? ?Filed Weights  ? 12/15/21 1112  ?Weight: 213 lb (96.6 kg)  ? ? ?Physical Exam ?Constitutional:   ?   General: She is not in acute distress. ?HENT:  ?   Head: Normocephalic and atraumatic.  ?Eyes:  ?   General: No scleral icterus. ?Cardiovascular:  ?    Rate and Rhythm: Normal rate and regular rhythm.  ?   Heart sounds: Normal heart sounds.  ?Pulmonary:  ?   Effort: Pulmonary effort is normal. No respiratory distress.  ?   Breath sounds: No wheezing.  ?Abdominal:  ?   General: Bowel sounds are normal. There is no distension.  ?   Palpations: Abdomen is soft.  ?Musculoskeletal:     ?   General: No deformity. Normal range of motion.  ?   Cervical back: Normal range of motion and neck supple.  ?   Comments: Trace edema, bilateral lower extremities.  ?varicose vein bilaterally.   ?Skin: ?   General: Skin is warm and dry.  ?   Findings: No erythema or rash.  ?Neurological:  ?   Mental Status: She  is alert and oriented to person, place, and time. Mental status is at baseline.  ?   Cranial Nerves: No cranial nerve deficit.  ?   Coordination: Coordination normal.  ?Psychiatric:     ?   Mood and Affect: Mood normal.  ? ? ?LABORATORY DATA:  ?I have reviewed the data as listed ?Lab Results  ?Component Value Date  ? WBC 6.7 12/13/2021  ? HGB 11.6 (L) 12/13/2021  ? HCT 35.9 (L) 12/13/2021  ? MCV 86.7 12/13/2021  ? PLT 263 12/13/2021  ? ?Recent Labs  ?  05/25/21 ?0923 06/03/21 ?1120 06/05/21 ?0423 06/06/21 ?0427 12/13/21 ?1004  ?NA 134*   < > 137 136 136  ?K 4.4   < > 3.8 3.6 3.9  ?CL 102   < > 107 103 102  ?CO2 26   < > '25 24 27  '$ ?GLUCOSE 101*   < > 93 96 97  ?BUN 17   < > '13 10 16  '$ ?CREATININE 0.74   < > 0.60 0.55 0.63  ?CALCIUM 9.3   < > 8.5* 8.8* 9.1  ?GFRNONAA >60   < > >60 >60 >60  ?PROT 7.3  --   --   --  7.0  ?ALBUMIN 4.2  --   --   --  3.9  ?AST 15  --   --   --  14*  ?ALT 10  --   --   --  10  ?ALKPHOS 61  --   --   --  56  ?BILITOT 0.8  --   --   --  0.7  ? < > = values in this interval not displayed.  ? ? ?Iron/TIBC/Ferritin/ %Sat ?   ?Component Value Date/Time  ? IRON 59 05/25/2021 0923  ? TIBC 360 05/25/2021 0923  ? FERRITIN 21 05/25/2021 0923  ? IRONPCTSAT 16 05/25/2021 0923  ?  ?hypercoagulable state work up.  ?Negative prothrombin gene mutation, Factor V leiden  mutation.  ?Negative anticardiolipin IgM, indeterminate level of anticardiolipin IgG, not meeting antiphospholipid syndrome diagnosis criteria.  ? ?RADIOGRAPHIC STUDIES: ?I have personally reviewed the radiol

## 2021-12-27 ENCOUNTER — Other Ambulatory Visit: Payer: Self-pay | Admitting: Family Medicine

## 2021-12-27 DIAGNOSIS — M1611 Unilateral primary osteoarthritis, right hip: Secondary | ICD-10-CM

## 2022-01-04 ENCOUNTER — Ambulatory Visit
Admission: RE | Admit: 2022-01-04 | Discharge: 2022-01-04 | Disposition: A | Discharge status: Home / Self Care | Payer: Medicare Other | Source: Ambulatory Visit | Attending: Family Medicine | Admitting: Family Medicine

## 2022-01-04 DIAGNOSIS — M1611 Unilateral primary osteoarthritis, right hip: Secondary | ICD-10-CM | POA: Diagnosis present

## 2022-02-08 DIAGNOSIS — M1611 Unilateral primary osteoarthritis, right hip: Secondary | ICD-10-CM | POA: Insufficient documentation

## 2022-02-08 DIAGNOSIS — M879 Osteonecrosis, unspecified: Secondary | ICD-10-CM | POA: Insufficient documentation

## 2022-02-10 ENCOUNTER — Encounter (INDEPENDENT_AMBULATORY_CARE_PROVIDER_SITE_OTHER): Payer: Medicare Other | Admitting: Vascular Surgery

## 2022-02-17 ENCOUNTER — Encounter (INDEPENDENT_AMBULATORY_CARE_PROVIDER_SITE_OTHER): Payer: Self-pay | Admitting: Nurse Practitioner

## 2022-02-17 ENCOUNTER — Ambulatory Visit (INDEPENDENT_AMBULATORY_CARE_PROVIDER_SITE_OTHER): Payer: Medicare Other | Admitting: Nurse Practitioner

## 2022-02-17 VITALS — BP 116/64 | HR 76 | Resp 16 | Ht 66.5 in | Wt 199.0 lb

## 2022-02-17 DIAGNOSIS — Z86718 Personal history of other venous thrombosis and embolism: Secondary | ICD-10-CM

## 2022-02-18 ENCOUNTER — Telehealth (INDEPENDENT_AMBULATORY_CARE_PROVIDER_SITE_OTHER): Payer: Self-pay

## 2022-02-18 NOTE — Telephone Encounter (Signed)
Spoke with the patient and she is scheduled with Dr. Delana Meyer for a IVC filter placement on 03/15/22 with a 8:15 am arrival time to the MM. Pre-procedure instructions were discussed and will be mailed.

## 2022-02-24 ENCOUNTER — Other Ambulatory Visit: Payer: Self-pay | Admitting: Surgery

## 2022-02-27 ENCOUNTER — Encounter (INDEPENDENT_AMBULATORY_CARE_PROVIDER_SITE_OTHER): Payer: Self-pay | Admitting: Nurse Practitioner

## 2022-02-27 NOTE — H&P (View-Only) (Signed)
Subjective:    Patient ID: Dana Bautista, female    DOB: 1946-09-21, 76 y.o.   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  DVT/PE was identified years ago and was treated with anticoagulation.  Her initial presenting symptoms were left chest pain.  This has largely resolved as indicated by previous notes.  Currently the patient does not exhibit signs and symptoms of DVT/PE such as cough or hemoptysis.  She is still currently maintained on Eliquis 2.5 mg twice daily.    Review of Systems  Musculoskeletal:  Positive for arthralgias.  All other systems reviewed and are negative.     Objective:   Physical Exam Vitals reviewed.  HENT:     Head: Normocephalic.  Cardiovascular:     Rate and Rhythm: Normal rate.     Pulses: Normal pulses.  Pulmonary:     Effort: Pulmonary effort is normal.  Skin:    General: Skin is warm and dry.  Neurological:     Mental Status: She is alert and oriented to person, place, and time.     Gait: Gait abnormal.  Psychiatric:        Mood and Affect: Mood normal.        Behavior: Behavior normal.        Thought Content: Thought content normal.        Judgment: Judgment normal.    BP 116/64 (BP Location: Left Arm)   Pulse 76   Resp 16   Ht 5' 6.5" (1.689 m)   Wt 199 lb (90.3 kg)   BMI 31.64 kg/m   Past Medical History:  Diagnosis Date   Arthritis    osteoarthriist   Cancer ([facility])    skin   Complication of anesthesia    nausea   DVT (deep venous thrombosis) ([facility]) 09/2019   Heart murmur    Mitral valve regurgitation    Pulmonary embolism ([facility])    Tendonitis    outer aspect of right foot   UTI (lower urinary tract infection)     Social History   Socioeconomic History   Marital status: Married     Spouse name: Not on file   Number of children: Not on file   Years of education: Not on file   Highest education level: Not on file  Occupational History   Not on file  Tobacco Use   Smoking status: Never   Smokeless tobacco: Never  Vaping Use   Vaping Use: Never used  Substance and Sexual Activity   Alcohol use: No   Drug use: No   Sexual activity: Not on file  Other Topics Concern   Not on file  Social History Narrative   Not on file   Social Determinants of Health   Financial Resource Strain: Not on file  Food Insecurity: Not on file  Transportation Needs: Not on file  Physical Activity: Not on file  Stress: Not on file  Social Connections: Not on file  Intimate Partner Violence: Not on file    Past Surgical History:  Procedure Laterality Date   ABDOMINAL HYSTERECTOMY     APPENDECTOMY     FRACTURE SURGERY     left wrist   FRACTURE SURGERY Left    wrist   INCISION  AND DRAINAGE Right 06/03/2021   Procedure: INCISION AND DRAINAGE-Right Index Finger;  Surgeon: Corky Mull, MD;  Location: [facility] OR[facility];  Service: Orthopedics;  Laterality: Right;   TONSILLECTOMY      Family History  Problem Relation Age of Onset   Breast cancer Mother    Asthma Mother    Lung cancer Father    Heart disease Brother    Heart disease Maternal Grandfather    Throat cancer Maternal Grandfather     Allergies  Allergen Reactions   Codeine Nausea And Vomiting   Elemental Sulfur Other (See Comments)    Lip swelling, difficulty breathing   Gabapentin Nausea Only    Other reaction(s): Vomiting   Levaquin [Levofloxacin] Hives   Penicillins Hives and Other (See Comments)    Did it involve swelling of the face/tongue/throat, SOB, or low BP? Unknown Did it involve sudden or severe rash/hives, skin peeling, or any reaction on the inside of your mouth or nose? Yes Did you need to seek medical attention at a hospital or doctor's office? Yes When did it last happen? many years If all above  answers are "NO", may proceed with cephalosporin use.    Sulfa Antibiotics     Other reaction(s): lip swelling   Latex Rash       Latest Ref Rng & Units 12/13/2021   10:04 AM 06/06/2021    4:27 AM 06/05/2021    4:23 AM  CBC  WBC 4.0 - 10.5 K/uL 6.7   7.3   6.7    Hemoglobin 12.0 - 15.0 g/dL 11.6   11.4   10.6    Hematocrit 36.0 - 46.0 % 35.9   33.6   33.1    Platelets 150 - 400 K/uL 263   245   204        CMP     Component Value Date/Time   NA 136 12/13/2021 1004   K 3.9 12/13/2021 1004   CL 102 12/13/2021 1004   CO2 27 12/13/2021 1004   GLUCOSE 97 12/13/2021 1004   BUN 16 12/13/2021 1004   CREATININE 0.63 12/13/2021 1004   CALCIUM 9.1 12/13/2021 1004   PROT 7.0 12/13/2021 1004   ALBUMIN 3.9 12/13/2021 1004   AST 14 (L) 12/13/2021 1004   ALT 10 12/13/2021 1004   ALKPHOS 56 12/13/2021 1004   BILITOT 0.7 12/13/2021 1004   GFRNONAA >60 12/13/2021 1004   GFRAA >60 05/01/2020 1102     No results found.     Assessment & Plan:   1. History of deep vein thrombosis (DVT) of lower extremity  IVC filter is strongly indicated prior to high risk orthopedic surgery.  Especially given the history of PE / DVT.  IVC filter placement will be done the week prior for surgery. Risk and benefits were reviewed the patient.  Indications for the procedure were reviewed.  All questions were answered, the patient agrees to proceed.   The patient will follow-up with me 6-8 weeks after the joint replacement surgery to discuss removal (this was also discussed today and the patient agrees with the plan to have the filter removed).     Current Outpatient Medications on File Prior to Visit  Medication Sig Dispense Refill   apixaban (ELIQUIS) 2.5 MG TABS tablet Take 1 tablet (2.5 mg total) by mouth 2 (two) times daily. 180 tablet 3   Calcium Carbonate-Vitamin D 600-5 MG-MCG TABS Take by mouth.     Cholecalciferol (D-3-5) 125 MCG (5000 UT) capsule Take 5,000  Units by mouth daily.      cyanocobalamin (,VITAMIN B-12,) 1000 MCG/ML injection Inject 1,000 mcg into the muscle every 30 (thirty) days.     docusate sodium (COLACE) 100 MG capsule Take 100 mg by mouth daily.     ferrous sulfate 325 (65 FE) MG tablet Take by mouth.     solifenacin (VESICARE) 5 MG tablet Take 5 mg by mouth daily.     traMADol (ULTRAM) 50 MG tablet Take 1 tablet (50 mg total) by mouth every 6 (six) hours as needed for moderate pain. 20 tablet 0   traMADol (ULTRAM) 50 MG tablet Take 1 tablet by mouth every 6 (six) hours as needed.     cetirizine (ZYRTEC) 10 MG tablet  (Patient not taking: Reported on 02/17/2022)     diclofenac Sodium (VOLTAREN) 1 % GEL  (Patient not taking: Reported on 02/17/2022)     doxycycline (VIBRA-TABS) 100 MG tablet Take 1 tablet (100 mg total) by mouth every 12 (twelve) hours. (Patient not taking: Reported on 02/17/2022) 14 tablet 0   ondansetron (ZOFRAN) 4 MG tablet Take 1 tablet (4 mg total) by mouth every 6 (six) hours as needed for nausea. (Patient not taking: Reported on 02/17/2022) 20 tablet 0   pantoprazole (PROTONIX) 40 MG tablet Take 1 tablet (40 mg total) by mouth daily. (Patient not taking: Reported on 02/17/2022) 15 tablet 0   No current facility-administered medications on file prior to visit.    There are no Patient Instructions on file for this visit. No follow-ups on file.   Kris Hartmann, NP

## 2022-02-27 NOTE — Progress Notes (Signed)
Subjective:    Patient ID: Dana Bautista, female    DOB: 1946/02/28, 76 y.o.   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  DVT/PE was identified years ago and was treated with anticoagulation.  Her initial presenting symptoms were left chest pain.  This has largely resolved as indicated by previous notes.  Currently the patient does not exhibit signs and symptoms of DVT/PE such as cough or hemoptysis.  She is still currently maintained on Eliquis 2.5 mg twice daily.    Review of Systems  Musculoskeletal:  Positive for arthralgias.  All other systems reviewed and are negative.     Objective:   Physical Exam Vitals reviewed.  HENT:     Head: Normocephalic.  Cardiovascular:     Rate and Rhythm: Normal rate.     Pulses: Normal pulses.  Pulmonary:     Effort: Pulmonary effort is normal.  Skin:    General: Skin is warm and dry.  Neurological:     Mental Status: She is alert and oriented to person, place, and time.     Gait: Gait abnormal.  Psychiatric:        Mood and Affect: Mood normal.        Behavior: Behavior normal.        Thought Content: Thought content normal.        Judgment: Judgment normal.    BP 116/64 (BP Location: Left Arm)   Pulse 76   Resp 16   Ht 5' 6.5" (1.689 m)   Wt 199 lb (90.3 kg)   BMI 31.64 kg/m   Past Medical History:  Diagnosis Date   Arthritis    osteoarthriist   Cancer ([facility])    skin   Complication of anesthesia    nausea   DVT (deep venous thrombosis) ([facility]) 09/2019   Heart murmur    Mitral valve regurgitation    Pulmonary embolism ([facility])    Tendonitis    outer aspect of right foot   UTI (lower urinary tract infection)     Social History   Socioeconomic History   Marital status: Married     Spouse name: Not on file   Number of children: Not on file   Years of education: Not on file   Highest education level: Not on file  Occupational History   Not on file  Tobacco Use   Smoking status: Never   Smokeless tobacco: Never  Vaping Use   Vaping Use: Never used  Substance and Sexual Activity   Alcohol use: No   Drug use: No   Sexual activity: Not on file  Other Topics Concern   Not on file  Social History Narrative   Not on file   Social Determinants of Health   Financial Resource Strain: Not on file  Food Insecurity: Not on file  Transportation Needs: Not on file  Physical Activity: Not on file  Stress: Not on file  Social Connections: Not on file  Intimate Partner Violence: Not on file    Past Surgical History:  Procedure Laterality Date   ABDOMINAL HYSTERECTOMY     APPENDECTOMY     FRACTURE SURGERY     left wrist   FRACTURE SURGERY Left    wrist   INCISION  AND DRAINAGE Right 06/03/2021   Procedure: INCISION AND DRAINAGE-Right Index Finger;  Surgeon: Corky Mull, MD;  Location: [facility] OR[facility];  Service: Orthopedics;  Laterality: Right;   TONSILLECTOMY      Family History  Problem Relation Age of Onset   Breast cancer Mother    Asthma Mother    Lung cancer Father    Heart disease Brother    Heart disease Maternal Grandfather    Throat cancer Maternal Grandfather     Allergies  Allergen Reactions   Codeine Nausea And Vomiting   Elemental Sulfur Other (See Comments)    Lip swelling, difficulty breathing   Gabapentin Nausea Only    Other reaction(s): Vomiting   Levaquin [Levofloxacin] Hives   Penicillins Hives and Other (See Comments)    Did it involve swelling of the face/tongue/throat, SOB, or low BP? Unknown Did it involve sudden or severe rash/hives, skin peeling, or any reaction on the inside of your mouth or nose? Yes Did you need to seek medical attention at a hospital or doctor's office? Yes When did it last happen? many years If all above  answers are "NO", may proceed with cephalosporin use.    Sulfa Antibiotics     Other reaction(s): lip swelling   Latex Rash       Latest Ref Rng & Units 12/13/2021   10:04 AM 06/06/2021    4:27 AM 06/05/2021    4:23 AM  CBC  WBC 4.0 - 10.5 K/uL 6.7   7.3   6.7    Hemoglobin 12.0 - 15.0 g/dL 11.6   11.4   10.6    Hematocrit 36.0 - 46.0 % 35.9   33.6   33.1    Platelets 150 - 400 K/uL 263   245   204        CMP     Component Value Date/Time   NA 136 12/13/2021 1004   K 3.9 12/13/2021 1004   CL 102 12/13/2021 1004   CO2 27 12/13/2021 1004   GLUCOSE 97 12/13/2021 1004   BUN 16 12/13/2021 1004   CREATININE 0.63 12/13/2021 1004   CALCIUM 9.1 12/13/2021 1004   PROT 7.0 12/13/2021 1004   ALBUMIN 3.9 12/13/2021 1004   AST 14 (L) 12/13/2021 1004   ALT 10 12/13/2021 1004   ALKPHOS 56 12/13/2021 1004   BILITOT 0.7 12/13/2021 1004   GFRNONAA >60 12/13/2021 1004   GFRAA >60 05/01/2020 1102     No results found.     Assessment & Plan:   1. History of deep vein thrombosis (DVT) of lower extremity  IVC filter is strongly indicated prior to high risk orthopedic surgery.  Especially given the history of PE / DVT.  IVC filter placement will be done the week prior for surgery. Risk and benefits were reviewed the patient.  Indications for the procedure were reviewed.  All questions were answered, the patient agrees to proceed.   The patient will follow-up with me 6-8 weeks after the joint replacement surgery to discuss removal (this was also discussed today and the patient agrees with the plan to have the filter removed).     Current Outpatient Medications on File Prior to Visit  Medication Sig Dispense Refill   apixaban (ELIQUIS) 2.5 MG TABS tablet Take 1 tablet (2.5 mg total) by mouth 2 (two) times daily. 180 tablet 3   Calcium Carbonate-Vitamin D 600-5 MG-MCG TABS Take by mouth.     Cholecalciferol (D-3-5) 125 MCG (5000 UT) capsule Take 5,000  Units by mouth daily.      cyanocobalamin (,VITAMIN B-12,) 1000 MCG/ML injection Inject 1,000 mcg into the muscle every 30 (thirty) days.     docusate sodium (COLACE) 100 MG capsule Take 100 mg by mouth daily.     ferrous sulfate 325 (65 FE) MG tablet Take by mouth.     solifenacin (VESICARE) 5 MG tablet Take 5 mg by mouth daily.     traMADol (ULTRAM) 50 MG tablet Take 1 tablet (50 mg total) by mouth every 6 (six) hours as needed for moderate pain. 20 tablet 0   traMADol (ULTRAM) 50 MG tablet Take 1 tablet by mouth every 6 (six) hours as needed.     cetirizine (ZYRTEC) 10 MG tablet  (Patient not taking: Reported on 02/17/2022)     diclofenac Sodium (VOLTAREN) 1 % GEL  (Patient not taking: Reported on 02/17/2022)     doxycycline (VIBRA-TABS) 100 MG tablet Take 1 tablet (100 mg total) by mouth every 12 (twelve) hours. (Patient not taking: Reported on 02/17/2022) 14 tablet 0   ondansetron (ZOFRAN) 4 MG tablet Take 1 tablet (4 mg total) by mouth every 6 (six) hours as needed for nausea. (Patient not taking: Reported on 02/17/2022) 20 tablet 0   pantoprazole (PROTONIX) 40 MG tablet Take 1 tablet (40 mg total) by mouth daily. (Patient not taking: Reported on 02/17/2022) 15 tablet 0   No current facility-administered medications on file prior to visit.    There are no Patient Instructions on file for this visit. No follow-ups on file.   Kris Hartmann, NP

## 2022-03-11 ENCOUNTER — Encounter: Payer: Self-pay | Admitting: Urgent Care

## 2022-03-11 ENCOUNTER — Encounter
Admission: RE | Admit: 2022-03-11 | Discharge: 2022-03-11 | Disposition: A | Discharge status: Home / Self Care | Payer: Medicare Other | Source: Ambulatory Visit | Attending: Surgery | Admitting: Surgery

## 2022-03-11 DIAGNOSIS — Z01818 Encounter for other preprocedural examination: Secondary | ICD-10-CM | POA: Diagnosis present

## 2022-03-11 HISTORY — DX: Other pericardial effusion (noninflammatory): I31.39

## 2022-03-11 HISTORY — DX: Prediabetes: R73.03

## 2022-03-11 HISTORY — DX: Cellulitis, unspecified: L03.90

## 2022-03-11 HISTORY — DX: Nontoxic single thyroid nodule: E04.1

## 2022-03-11 HISTORY — DX: Sleep apnea, unspecified: G47.30

## 2022-03-11 HISTORY — DX: Anemia, unspecified: D64.9

## 2022-03-11 HISTORY — DX: Deficiency of other specified B group vitamins: E53.8

## 2022-03-11 HISTORY — DX: Other specified postprocedural states: Z98.890

## 2022-03-11 HISTORY — DX: Other specified postprocedural states: R11.2

## 2022-03-11 HISTORY — DX: Pleural effusion, not elsewhere classified: J90

## 2022-03-11 HISTORY — DX: Occlusion and stenosis of bilateral carotid arteries: I65.23

## 2022-03-11 LAB — CBC WITH DIFFERENTIAL/PLATELET
Abs Immature Granulocytes: 0.02 10*3/uL (ref 0.00–0.07)
Basophils Absolute: 0 10*3/uL (ref 0.0–0.1)
Basophils Relative: 0 %
Eosinophils Absolute: 0 10*3/uL (ref 0.0–0.5)
Eosinophils Relative: 0 %
HCT: 35.2 % — ABNORMAL LOW (ref 36.0–46.0)
Hemoglobin: 11.4 g/dL — ABNORMAL LOW (ref 12.0–15.0)
Immature Granulocytes: 0 %
Lymphocytes Relative: 28 %
Lymphs Abs: 2 10*3/uL (ref 0.7–4.0)
MCH: 28.1 pg (ref 26.0–34.0)
MCHC: 32.4 g/dL (ref 30.0–36.0)
MCV: 86.9 fL (ref 80.0–100.0)
Monocytes Absolute: 0.6 10*3/uL (ref 0.1–1.0)
Monocytes Relative: 8 %
Neutro Abs: 4.4 10*3/uL (ref 1.7–7.7)
Neutrophils Relative %: 64 %
Platelets: 271 10*3/uL (ref 150–400)
RBC: 4.05 MIL/uL (ref 3.87–5.11)
RDW: 14.1 % (ref 11.5–15.5)
WBC: 7 10*3/uL (ref 4.0–10.5)
nRBC: 0 % (ref 0.0–0.2)

## 2022-03-11 LAB — TYPE AND SCREEN
ABO/RH(D): A NEG
Antibody Screen: NEGATIVE

## 2022-03-11 LAB — SURGICAL PCR SCREEN
MRSA, PCR: NEGATIVE
Staphylococcus aureus: NEGATIVE

## 2022-03-11 NOTE — Pre-Procedure Instructions (Addendum)
Pt having IVC filter placed on 6-20 prior to [place] with Poggi on 6-27. Pt has been instructed to stop her Eliquis 3 days prior (last dose today 6-16) for her IVC filter on 6-20. Pt unsure if she needs to start back prior to hip replacement. Pt seeing Cassell Smiles PA today at 1315 for her H&P. Pt will ask Cassell Smiles what she needs to do regarding her Eliquis

## 2022-03-11 NOTE — Pre-Procedure Instructions (Addendum)
Pt has current uti today while her in PAT for her preop and is on macrobid x 5 days. Pt will finish abx on 6-19 Monday.Honor Loh NP notified of this and wants pt to come back on 6-20 (the day she has to have her IVC filter placed) and will drop off urine then. Cup given to pt with wipes and bag. Pt understands to get urine sample at home that day and husband will drop off urine in PAT once pt goes back for her IVC filter on 6-20

## 2022-03-11 NOTE — Patient Instructions (Addendum)
Your procedure is scheduled on:03-22-22 Tuesday Report to the Registration Desk on the 1st floor of the [facility].Then proceed to the 2nd floor Surgery Desk To find out your arrival time, please call [phone] between 1PM - 3PM on:03-21-22 Monday If your arrival time is 6:00 am, do not arrive prior to that time as the [facility] entrance doors do not open until 6:00 am.  REMEMBER: Instructions that are not followed completely may result in serious medical risk, up to and including death; or upon the discretion of your surgeon and anesthesiologist your surgery may need to be rescheduled.  Do not eat food after midnight the night before surgery.  No gum chewing, lozengers or hard candies.  You may however, drink CLEAR liquids up to 2 hours before you are scheduled to arrive for your surgery. Do not drink anything within 2 hours of your scheduled arrival time.  Clear liquids include: - water  - apple juice without pulp - gatorade (not RED colors) - black coffee or tea (Do NOT add milk or creamers to the coffee or tea) Do NOT drink anything that is not on this list.  In addition, your doctor has ordered for you to drink the provided  Ensure Pre-Surgery Clear Carbohydrate Drink  Drinking this carbohydrate drink up to two hours before surgery helps to reduce insulin resistance and improve patient outcomes. Please complete drinking 2 hours prior to scheduled arrival time.  TAKE THESE MEDICATIONS THE MORNING OF SURGERY WITH A SIP OF WATER: -solifenacin (VESICARE)   Ask Dr Roland Rack today (03-11-22) about when you need to stop your apixaban Arne [place])   One week prior to surgery:Last dose on 03-14-22 Stop Anti-inflammatories (NSAIDS) such as Advil, Aleve, Ibuprofen, Motrin, Naproxen, Naprosyn and Aspirin based products such as Excedrin, Goodys Powder, BC Powder.You may however,take Tylenol if needed for pain up until the day of surgery.  Stop ANY OVER THE COUNTER supplements/vitamins 7 days  prior to surgery  No Alcohol for 24 hours before or after surgery.  No Smoking including e-cigarettes for 24 hours prior to surgery.  No chewable tobacco products for at least 6 hours prior to surgery.  No nicotine patches on the day of surgery.  Do not use any "recreational" drugs for at least a week prior to your surgery.  Please be advised that the combination of cocaine and anesthesia may have negative outcomes, up to and including death. If you test positive for cocaine, your surgery will be cancelled.  On the morning of surgery brush your teeth with toothpaste and water, you may rinse your mouth with mouthwash if you wish. Do not swallow any toothpaste or mouthwash.  Use CHG Soap as directed on instruction sheet.  Do not wear jewelry, make-up, hairpins, clips or nail polish.  Do not wear lotions, powders, or perfumes.   Do not shave body from the neck down 48 hours prior to surgery just in case you cut yourself which could leave a site for infection.  Also, freshly shaved skin may become irritated if using the CHG soap.  Contact lenses, hearing aids and dentures may not be worn into surgery.  Do not bring valuables to the hospital. [facility] is not responsible for any missing/lost belongings or valuables.   Notify your doctor if there is any change in your medical condition (cold, fever, infection).  Wear comfortable clothing (specific to your surgery type) to the hospital.  After surgery, you can help prevent lung complications by doing breathing exercises.  Take  deep breaths and cough every 1-2 hours. Your doctor may order a device called an Incentive Spirometer to help you take deep breaths. When coughing or sneezing, hold a pillow firmly against your incision with both hands. This is called "splinting." Doing this helps protect your incision. It also decreases belly discomfort.  If you are being admitted to the hospital overnight, leave your suitcase in the car. After  surgery it may be brought to your room.  If you are being discharged the day of surgery, you will not be allowed to drive home. You will need a responsible adult (18 years or older) to drive you home and stay with you that night.   If you are taking public transportation, you will need to have a responsible adult (18 years or older) with you. Please confirm with your physician that it is acceptable to use public transportation.   Please call the [facility] Dept. at [phone] if you have any questions about these instructions.  Surgery Visitation Policy:  Patients undergoing a surgery or procedure may have two family members or support persons with them as long as the person is not COVID-19 positive or experiencing its symptoms.   Inpatient Visitation:    Visiting hours are 7 a.m. to 8 p.m. Up to four visitors are allowed at one time in a patient room, including children. The visitors may rotate out with other people during the day. One designated support person (adult) may remain overnight.

## 2022-03-15 ENCOUNTER — Encounter: Payer: Self-pay | Admitting: Vascular Surgery

## 2022-03-15 ENCOUNTER — Encounter
Admission: RE | Disposition: A | Discharge status: Home / Self Care | Payer: Self-pay | Source: Ambulatory Visit | Attending: Vascular Surgery

## 2022-03-15 ENCOUNTER — Ambulatory Visit
Admission: RE | Admit: 2022-03-15 | Discharge: 2022-03-15 | Disposition: A | Discharge status: Home / Self Care | Payer: Medicare Other | Source: Ambulatory Visit | Attending: Vascular Surgery | Admitting: Vascular Surgery

## 2022-03-15 DIAGNOSIS — Z86718 Personal history of other venous thrombosis and embolism: Secondary | ICD-10-CM | POA: Insufficient documentation

## 2022-03-15 DIAGNOSIS — I82409 Acute embolism and thrombosis of unspecified deep veins of unspecified lower extremity: Secondary | ICD-10-CM

## 2022-03-15 DIAGNOSIS — Z7901 Long term (current) use of anticoagulants: Secondary | ICD-10-CM | POA: Insufficient documentation

## 2022-03-15 DIAGNOSIS — Z408 Encounter for other prophylactic surgery: Secondary | ICD-10-CM | POA: Insufficient documentation

## 2022-03-15 DIAGNOSIS — Z86711 Personal history of pulmonary embolism: Secondary | ICD-10-CM | POA: Insufficient documentation

## 2022-03-15 HISTORY — PX: IVC FILTER INSERTION: CATH118245

## 2022-03-15 LAB — URINALYSIS, ROUTINE W REFLEX MICROSCOPIC
Bilirubin Urine: NEGATIVE
Glucose, UA: NEGATIVE mg/dL
Hgb urine dipstick: NEGATIVE
Ketones, ur: 5 mg/dL — AB
Leukocytes,Ua: NEGATIVE
Nitrite: NEGATIVE
Protein, ur: NEGATIVE mg/dL
Specific Gravity, Urine: 1.003 — ABNORMAL LOW (ref 1.005–1.030)
pH: 5 (ref 5.0–8.0)

## 2022-03-15 SURGERY — IVC FILTER INSERTION
Anesthesia: Moderate Sedation

## 2022-03-15 MED ORDER — ONDANSETRON HCL 4 MG/2ML IJ SOLN: 4.0000 mg | Freq: Four times a day (QID) | INTRAMUSCULAR | Status: DC | PRN

## 2022-03-15 MED ORDER — IODIXANOL 320 MG/ML IV SOLN
INTRAVENOUS | Status: DC | PRN
  Administered 2022-03-15: 20 mL

## 2022-03-15 MED ORDER — HEPARIN SODIUM (PORCINE) 1000 UNIT/ML IJ SOLN
INTRAMUSCULAR | Status: AC
  Filled 2022-03-15: qty 10

## 2022-03-15 MED ORDER — FENTANYL CITRATE (PF) 100 MCG/2ML IJ SOLN
INTRAMUSCULAR | Status: DC | PRN
  Administered 2022-03-15: 50 ug via INTRAVENOUS
  Administered 2022-03-15 (×2): 25 ug via INTRAVENOUS

## 2022-03-15 MED ORDER — MIDAZOLAM HCL 5 MG/5ML IJ SOLN
INTRAMUSCULAR | Status: AC
  Filled 2022-03-15: qty 5

## 2022-03-15 MED ORDER — METHYLPREDNISOLONE SODIUM SUCC 125 MG IJ SOLR
125.0000 mg | Freq: Once | INTRAMUSCULAR | Status: DC | PRN
Start: 2022-03-15 — End: 2022-03-15

## 2022-03-15 MED ORDER — SODIUM CHLORIDE 0.9 % IV SOLN: INTRAVENOUS | Status: DC

## 2022-03-15 MED ORDER — FENTANYL CITRATE (PF) 100 MCG/2ML IJ SOLN
INTRAMUSCULAR | Status: AC
  Filled 2022-03-15: qty 2

## 2022-03-15 MED ORDER — DIPHENHYDRAMINE HCL 50 MG/ML IJ SOLN: 50.0000 mg | Freq: Once | INTRAMUSCULAR | Status: DC | PRN

## 2022-03-15 MED ORDER — HYDROMORPHONE HCL 1 MG/ML IJ SOLN: 1.0000 mg | Freq: Once | INTRAMUSCULAR | Status: DC | PRN

## 2022-03-15 MED ORDER — MIDAZOLAM HCL 2 MG/2ML IJ SOLN
INTRAMUSCULAR | Status: DC | PRN
  Administered 2022-03-15: 2 mg via INTRAVENOUS
  Administered 2022-03-15: 1 mg via INTRAVENOUS

## 2022-03-15 MED ORDER — FAMOTIDINE 20 MG PO TABS: 20.0000 mg | ORAL_TABLET | Freq: Once | ORAL | Status: DC

## 2022-03-15 MED ORDER — FAMOTIDINE 20 MG PO TABS: 40.0000 mg | ORAL_TABLET | Freq: Once | ORAL | Status: DC | PRN

## 2022-03-15 MED ORDER — VANCOMYCIN HCL IN DEXTROSE 1-5 GM/200ML-% IV SOLN
1000.0000 mg | INTRAVENOUS | Status: AC
  Administered 2022-03-15: 1000 mg via INTRAVENOUS
  Filled 2022-03-15: qty 200

## 2022-03-15 MED ORDER — MIDAZOLAM HCL 2 MG/ML PO SYRP: 8.0000 mg | ORAL_SOLUTION | Freq: Once | ORAL | Status: DC | PRN

## 2022-03-15 SURGICAL SUPPLY — 4 items
COVER PROBE U/S 5X48 (MISCELLANEOUS) ×1 IMPLANT
KIT FEM OPTION ELITE FILTER (Filter) ×1 IMPLANT
PACK ANGIOGRAPHY (CUSTOM PROCEDURE TRAY) ×2 IMPLANT
WIRE AMPLATZ SSTIFF .035X260CM (WIRE) ×1 IMPLANT

## 2022-03-15 NOTE — Op Note (Signed)
[facility]   OPERATIVE NOTE    PRE-OPERATIVE DIAGNOSIS: History of DVT with PE in 2021; upcoming hip replacement surgery  POST-OPERATIVE DIAGNOSIS: Same  PROCEDURE: 1.   Ultrasound guidance for vascular access to the right vein 2.   Catheter placement into the inferior vena cava 3.   Inferior venacavogram 4.   Placement of a option Elite IVC filter  SURGEON: [name]  ASSISTANT(S): None  ANESTHESIA: Conscious sedation was administered by the interventional radiology RN under my direct supervision. IV Versed plus fentanyl were utilized. Continuous ECG, pulse oximetry and blood pressure was monitored throughout the entire procedure. Conscious sedation was for a total of 14 minutes 36 seconds.  ESTIMATED BLOOD LOSS: minimal  FINDING(S): 1.  Patent IVC  SPECIMEN(S):  none  INDICATIONS:   Dana Bautista is a 77 y.o. y.o. female who presents with pending hip replacement surgery she has a history of DVT as well as PE in 2001.  Inferior vena cava filter is indicated for this reason.  Risks and benefits including filter thrombosis, migration, fracture, bleeding, and infection were all discussed.  We discussed that all IVC filters that we place can be removed if desired from the patient once the need for the filter has passed.    DESCRIPTION: After obtaining full informed written consent, the patient was brought back to the vascular suite. The skin was sterilely prepped and draped in a sterile surgical field was created. Ultrasound was placed in a sterile sleeve. The right common femoral vein was echolucent and compressible indicating patency. Image was recorded for the permanent record. The puncture was made under continuous real-time ultrasound guidance.  The right common femoral vein was accessed under direct ultrasound guidance without difficulty with a micropuncture needle. Microwire was then advanced under fluoroscopic guidance without difficulty. Micro-sheath  was then inserted and a J-wire was then placed. The dilator is passed over the wire and the delivery sheath was placed into the inferior vena cava.  Inferior venacavogram was performed. This demonstrated a patent IVC with the level of the renal veins at L2.  The filter was then deployed into the inferior vena cava at the level of L2-L3 just below the renal veins. The delivery sheath was then removed. Pressure was held. Sterile dressings were placed. The patient tolerated the procedure well and was taken to the recovery room in stable condition.  Interpretation: Inferior vena cava is opacified with a bolus injection of contrast.  It is widely patent.  It measures approximately 24 mm in diameter.  Option Elite filter is deployed at the L2-L3 level in good orientation.  COMPLICATIONS: None  CONDITION: Stable  [name]  03/15/2022, 10:43 AM

## 2022-03-15 NOTE — Progress Notes (Signed)
Patient ambulatory to the bathroom with her walker. No additional assistance needed. Patient states she feels good on her feet. Meal tray offered but patient declined. Husband brought back to bedside. Care ongoing.

## 2022-03-15 NOTE — Interval H&P Note (Signed)
History and Physical Interval Note:  03/15/2022 9:56 AM  Dana Bautista  has presented today for surgery, with the diagnosis of IVC Filter Placement   DVT.  The various methods of treatment have been discussed with the patient and family. After consideration of risks, benefits and other options for treatment, the patient has consented to  Procedure(s): IVC FILTER INSERTION (N/A) as a surgical intervention.  The patient's history has been reviewed, patient examined, no change in status, stable for surgery.  I have reviewed the patient's chart and labs.  Questions were answered to the patient's satisfaction.     [name]

## 2022-03-15 NOTE — Progress Notes (Signed)
Patient disconnected from medical equipment for discharge and states she is now hungry. Meal tray given. Patient states "I'm a vegetarian but I can eat the lettuce and tomato". Husband noted to have eaten half the sandwich and states "that was pretty good." Discharge instructions explained and questions answered. Discharge home at this time.

## 2022-03-16 ENCOUNTER — Encounter: Payer: Self-pay | Admitting: Vascular Surgery

## 2022-03-22 ENCOUNTER — Encounter: Payer: Self-pay | Admitting: Surgery

## 2022-03-22 ENCOUNTER — Inpatient Hospital Stay
Admission: AD | Admit: 2022-03-22 | Discharge: 2022-03-24 | DRG: 470 | Disposition: A | Discharge status: Home Health | Payer: Medicare Other | Attending: Surgery | Admitting: Surgery

## 2022-03-22 ENCOUNTER — Ambulatory Visit: Payer: Medicare Other | Admitting: Urgent Care

## 2022-03-22 ENCOUNTER — Other Ambulatory Visit: Payer: Self-pay

## 2022-03-22 ENCOUNTER — Encounter
Admission: AD | Disposition: A | Discharge status: Home Health | Payer: Self-pay | Source: Home / Self Care | Attending: Surgery

## 2022-03-22 ENCOUNTER — Observation Stay: Payer: Medicare Other

## 2022-03-22 DIAGNOSIS — Z885 Allergy status to narcotic agent status: Secondary | ICD-10-CM

## 2022-03-22 DIAGNOSIS — Z91040 Latex allergy status: Secondary | ICD-10-CM

## 2022-03-22 DIAGNOSIS — I341 Nonrheumatic mitral (valve) prolapse: Secondary | ICD-10-CM | POA: Diagnosis present

## 2022-03-22 DIAGNOSIS — E559 Vitamin D deficiency, unspecified: Secondary | ICD-10-CM | POA: Diagnosis present

## 2022-03-22 DIAGNOSIS — Z8614 Personal history of Methicillin resistant Staphylococcus aureus infection: Secondary | ICD-10-CM

## 2022-03-22 DIAGNOSIS — Z880 Allergy status to penicillin: Secondary | ICD-10-CM

## 2022-03-22 DIAGNOSIS — Z85828 Personal history of other malignant neoplasm of skin: Secondary | ICD-10-CM

## 2022-03-22 DIAGNOSIS — Z96641 Presence of right artificial hip joint: Principal | ICD-10-CM

## 2022-03-22 DIAGNOSIS — M879 Osteonecrosis, unspecified: Secondary | ICD-10-CM | POA: Diagnosis present

## 2022-03-22 DIAGNOSIS — Z86711 Personal history of pulmonary embolism: Secondary | ICD-10-CM

## 2022-03-22 DIAGNOSIS — Z882 Allergy status to sulfonamides status: Secondary | ICD-10-CM

## 2022-03-22 DIAGNOSIS — M170 Bilateral primary osteoarthritis of knee: Secondary | ICD-10-CM | POA: Diagnosis present

## 2022-03-22 DIAGNOSIS — K5909 Other constipation: Secondary | ICD-10-CM | POA: Diagnosis present

## 2022-03-22 DIAGNOSIS — Z90710 Acquired absence of both cervix and uterus: Secondary | ICD-10-CM

## 2022-03-22 DIAGNOSIS — Z881 Allergy status to other antibiotic agents status: Secondary | ICD-10-CM

## 2022-03-22 DIAGNOSIS — M167 Other unilateral secondary osteoarthritis of hip: Principal | ICD-10-CM | POA: Diagnosis present

## 2022-03-22 DIAGNOSIS — Z87440 Personal history of urinary (tract) infections: Secondary | ICD-10-CM

## 2022-03-22 DIAGNOSIS — G4730 Sleep apnea, unspecified: Secondary | ICD-10-CM | POA: Diagnosis present

## 2022-03-22 DIAGNOSIS — Z86718 Personal history of other venous thrombosis and embolism: Secondary | ICD-10-CM

## 2022-03-22 HISTORY — PX: TOTAL HIP ARTHROPLASTY: SHX124

## 2022-03-22 LAB — ABO/RH: ABO/RH(D): A NEG

## 2022-03-22 SURGERY — ARTHROPLASTY, HIP, TOTAL,POSTERIOR APPROACH
Anesthesia: Spinal | Site: Hip | Laterality: Right

## 2022-03-22 MED ORDER — METOCLOPRAMIDE HCL 10 MG PO TABS
5.0000 mg | ORAL_TABLET | Freq: Three times a day (TID) | ORAL | Status: DC | PRN
  Administered 2022-03-23: 10 mg via ORAL
  Filled 2022-03-22 (×2): qty 1

## 2022-03-22 MED ORDER — KETOROLAC TROMETHAMINE 15 MG/ML IJ SOLN
15.0000 mg | Freq: Once | INTRAMUSCULAR | Status: AC
  Administered 2022-03-22: 15 mg via INTRAVENOUS

## 2022-03-22 MED ORDER — SODIUM CHLORIDE FLUSH 0.9 % IV SOLN
INTRAVENOUS | Status: AC
  Filled 2022-03-22: qty 40

## 2022-03-22 MED ORDER — EPHEDRINE SULFATE (PRESSORS) 50 MG/ML IJ SOLN
INTRAMUSCULAR | Status: DC | PRN
  Administered 2022-03-22: 10 mg via INTRAVENOUS
  Administered 2022-03-22: 7.5 mg via INTRAVENOUS

## 2022-03-22 MED ORDER — ACETAMINOPHEN 10 MG/ML IV SOLN
INTRAVENOUS | Status: DC | PRN
  Administered 2022-03-22: 1000 mg via INTRAVENOUS

## 2022-03-22 MED ORDER — FAMOTIDINE 20 MG PO TABS
ORAL_TABLET | ORAL | Status: AC
  Administered 2022-03-22: 20 mg
  Filled 2022-03-22: qty 1

## 2022-03-22 MED ORDER — BUPIVACAINE-EPINEPHRINE (PF) 0.5% -1:200000 IJ SOLN
INTRAMUSCULAR | Status: AC
  Filled 2022-03-22: qty 30

## 2022-03-22 MED ORDER — BUPIVACAINE LIPOSOME 1.3 % IJ SUSP
INTRAMUSCULAR | Status: AC
  Filled 2022-03-22: qty 20

## 2022-03-22 MED ORDER — CEFAZOLIN SODIUM-DEXTROSE 2-4 GM/100ML-% IV SOLN
2.0000 g | INTRAVENOUS | Status: AC
  Administered 2022-03-22: 2 g via INTRAVENOUS

## 2022-03-22 MED ORDER — VITAMIN D 25 MCG (1000 UNIT) PO TABS
5000.0000 [IU] | ORAL_TABLET | Freq: Every day | ORAL | Status: DC
  Administered 2022-03-23 – 2022-03-24 (×2): 5000 [IU] via ORAL
  Filled 2022-03-22 (×2): qty 5

## 2022-03-22 MED ORDER — CYANOCOBALAMIN 1000 MCG/ML IJ SOLN: 1000.0000 ug | INTRAMUSCULAR | Status: DC

## 2022-03-22 MED ORDER — ONDANSETRON HCL 4 MG/2ML IJ SOLN
4.0000 mg | Freq: Four times a day (QID) | INTRAMUSCULAR | Status: DC | PRN
  Administered 2022-03-22 – 2022-03-23 (×3): 4 mg via INTRAVENOUS
  Filled 2022-03-22 (×2): qty 2

## 2022-03-22 MED ORDER — SODIUM CHLORIDE 0.9 % IR SOLN
Status: DC | PRN
  Administered 2022-03-22: 3000 mL

## 2022-03-22 MED ORDER — PROPOFOL 500 MG/50ML IV EMUL
INTRAVENOUS | Status: DC | PRN
  Administered 2022-03-22: 75 ug/kg/min via INTRAVENOUS

## 2022-03-22 MED ORDER — ACETAMINOPHEN 500 MG PO TABS
1000.0000 mg | ORAL_TABLET | Freq: Four times a day (QID) | ORAL | Status: AC
  Administered 2022-03-23 (×2): 1000 mg via ORAL
  Filled 2022-03-22 (×3): qty 2

## 2022-03-22 MED ORDER — CHLORHEXIDINE GLUCONATE 0.12 % MT SOLN
15.0000 mL | Freq: Once | OROMUCOSAL | Status: AC
Start: 2022-03-22 — End: 2022-03-22

## 2022-03-22 MED ORDER — SODIUM CHLORIDE 0.9 % IV SOLN: INTRAVENOUS | Status: DC

## 2022-03-22 MED ORDER — PROPOFOL 10 MG/ML IV BOLUS
INTRAVENOUS | Status: AC
  Filled 2022-03-22: qty 40

## 2022-03-22 MED ORDER — OXYCODONE HCL 5 MG PO TABS
5.0000 mg | ORAL_TABLET | ORAL | Status: DC | PRN
  Administered 2022-03-22 – 2022-03-23 (×2): 5 mg via ORAL
  Filled 2022-03-22 (×2): qty 1

## 2022-03-22 MED ORDER — FENTANYL CITRATE (PF) 100 MCG/2ML IJ SOLN
INTRAMUSCULAR | Status: AC
  Administered 2022-03-22: 25 ug via INTRAVENOUS
  Filled 2022-03-22: qty 2

## 2022-03-22 MED ORDER — APIXABAN 2.5 MG PO TABS
2.5000 mg | ORAL_TABLET | Freq: Two times a day (BID) | ORAL | Status: DC
  Administered 2022-03-23 – 2022-03-24 (×2): 2.5 mg via ORAL
  Filled 2022-03-22 (×3): qty 1

## 2022-03-22 MED ORDER — OXYCODONE HCL 5 MG PO TABS
5.0000 mg | ORAL_TABLET | Freq: Once | ORAL | Status: AC | PRN
  Administered 2022-03-22: 5 mg via ORAL

## 2022-03-22 MED ORDER — DOCUSATE SODIUM 100 MG PO CAPS
100.0000 mg | ORAL_CAPSULE | Freq: Two times a day (BID) | ORAL | Status: DC
  Administered 2022-03-22 – 2022-03-24 (×3): 100 mg via ORAL
  Filled 2022-03-22 (×3): qty 1

## 2022-03-22 MED ORDER — LACTATED RINGERS IV SOLN: INTRAVENOUS | Status: DC

## 2022-03-22 MED ORDER — MIDAZOLAM HCL 5 MG/5ML IJ SOLN
INTRAMUSCULAR | Status: DC | PRN
  Administered 2022-03-22 (×2): 1 mg via INTRAVENOUS

## 2022-03-22 MED ORDER — ACETAMINOPHEN 325 MG PO TABS
ORAL_TABLET | ORAL | Status: AC
  Administered 2022-03-22: 1000 mg via ORAL
  Filled 2022-03-22: qty 2

## 2022-03-22 MED ORDER — ONDANSETRON HCL 4 MG PO TABS
4.0000 mg | ORAL_TABLET | Freq: Four times a day (QID) | ORAL | Status: DC | PRN
Start: 2022-03-22 — End: 2022-03-23

## 2022-03-22 MED ORDER — KETOROLAC TROMETHAMINE 15 MG/ML IJ SOLN
INTRAMUSCULAR | Status: AC
  Filled 2022-03-22: qty 1

## 2022-03-22 MED ORDER — BUPIVACAINE HCL (PF) 0.5 % IJ SOLN
INTRAMUSCULAR | Status: DC | PRN
  Administered 2022-03-22: 3 mL via INTRATHECAL

## 2022-03-22 MED ORDER — ONDANSETRON HCL 4 MG/2ML IJ SOLN
INTRAMUSCULAR | Status: DC | PRN
  Administered 2022-03-22: 4 mg via INTRAVENOUS

## 2022-03-22 MED ORDER — DIPHENHYDRAMINE HCL 12.5 MG/5ML PO ELIX: 12.5000 mg | ORAL_SOLUTION | ORAL | Status: DC | PRN

## 2022-03-22 MED ORDER — METOCLOPRAMIDE HCL 5 MG/ML IJ SOLN
5.0000 mg | Freq: Three times a day (TID) | INTRAMUSCULAR | Status: DC | PRN
  Administered 2022-03-22: 10 mg via INTRAVENOUS

## 2022-03-22 MED ORDER — PROPOFOL 1000 MG/100ML IV EMUL
INTRAVENOUS | Status: AC
Start: 2022-03-22 — End: ?
  Filled 2022-03-22: qty 100

## 2022-03-22 MED ORDER — PROPOFOL 10 MG/ML IV BOLUS
INTRAVENOUS | Status: DC | PRN
  Administered 2022-03-22: 50 mg via INTRAVENOUS
  Administered 2022-03-22 (×2): 30 mg via INTRAVENOUS

## 2022-03-22 MED ORDER — OXYCODONE HCL 5 MG PO TABS
ORAL_TABLET | ORAL | Status: AC
  Filled 2022-03-22: qty 1

## 2022-03-22 MED ORDER — CALCIUM CITRATE POWD: 5.0000 mL | Freq: Every day | Status: DC

## 2022-03-22 MED ORDER — ACETAMINOPHEN 500 MG PO TABS
ORAL_TABLET | ORAL | Status: AC
  Filled 2022-03-22: qty 2

## 2022-03-22 MED ORDER — HYDROMORPHONE HCL 1 MG/ML IJ SOLN
0.2500 mg | INTRAMUSCULAR | Status: DC | PRN
  Administered 2022-03-23: 0.25 mg via INTRAVENOUS
  Administered 2022-03-23: 0.5 mg via INTRAVENOUS
  Filled 2022-03-22 (×2): qty 1

## 2022-03-22 MED ORDER — KETOROLAC TROMETHAMINE 15 MG/ML IJ SOLN
7.5000 mg | Freq: Four times a day (QID) | INTRAMUSCULAR | Status: AC
  Administered 2022-03-22 – 2022-03-23 (×4): 7.5 mg via INTRAVENOUS
  Filled 2022-03-22 (×4): qty 1

## 2022-03-22 MED ORDER — ACETAMINOPHEN 325 MG PO TABS: 325.0000 mg | ORAL_TABLET | Freq: Four times a day (QID) | ORAL | Status: DC | PRN

## 2022-03-22 MED ORDER — PROPOFOL 1000 MG/100ML IV EMUL
INTRAVENOUS | Status: AC
  Filled 2022-03-22: qty 100

## 2022-03-22 MED ORDER — CHLORHEXIDINE GLUCONATE 0.12 % MT SOLN
OROMUCOSAL | Status: AC
  Administered 2022-03-22: 15 mL via OROMUCOSAL
  Filled 2022-03-22: qty 15

## 2022-03-22 MED ORDER — TRANEXAMIC ACID 1000 MG/10ML IV SOLN
INTRAVENOUS | Status: DC | PRN
  Administered 2022-03-22: 1000 mg via TOPICAL

## 2022-03-22 MED ORDER — SODIUM CHLORIDE (PF) 0.9 % IJ SOLN
INTRAMUSCULAR | Status: DC | PRN
  Administered 2022-03-22: 90 mL

## 2022-03-22 MED ORDER — MIDAZOLAM HCL 2 MG/2ML IJ SOLN
INTRAMUSCULAR | Status: AC
  Filled 2022-03-22: qty 2

## 2022-03-22 MED ORDER — FENTANYL CITRATE (PF) 100 MCG/2ML IJ SOLN
25.0000 ug | INTRAMUSCULAR | Status: AC | PRN
  Administered 2022-03-22: 50 ug via INTRAVENOUS
  Administered 2022-03-22: 25 ug via INTRAVENOUS
  Administered 2022-03-22: 50 ug via INTRAVENOUS
  Administered 2022-03-22: 25 ug via INTRAVENOUS

## 2022-03-22 MED ORDER — HYDROMORPHONE HCL 1 MG/ML IJ SOLN
INTRAMUSCULAR | Status: AC
  Administered 2022-03-22: 0.5 mg via INTRAVENOUS
  Filled 2022-03-22: qty 1

## 2022-03-22 MED ORDER — PHENYLEPHRINE 80 MCG/ML (10ML) SYRINGE FOR IV PUSH (FOR BLOOD PRESSURE SUPPORT)
PREFILLED_SYRINGE | INTRAVENOUS | Status: DC | PRN
  Administered 2022-03-22: 160 ug via INTRAVENOUS

## 2022-03-22 MED ORDER — DARIFENACIN HYDROBROMIDE ER 7.5 MG PO TB24
7.5000 mg | ORAL_TABLET | Freq: Every day | ORAL | Status: DC
  Administered 2022-03-23 – 2022-03-24 (×2): 7.5 mg via ORAL
  Filled 2022-03-22 (×2): qty 1

## 2022-03-22 MED ORDER — ACETAMINOPHEN 10 MG/ML IV SOLN
INTRAVENOUS | Status: AC
  Filled 2022-03-22: qty 100

## 2022-03-22 MED ORDER — METOCLOPRAMIDE HCL 5 MG/ML IJ SOLN
INTRAMUSCULAR | Status: AC
  Filled 2022-03-22: qty 2

## 2022-03-22 MED ORDER — BISACODYL 10 MG RE SUPP: 10.0000 mg | Freq: Every day | RECTAL | Status: DC | PRN

## 2022-03-22 MED ORDER — OXYCODONE HCL 5 MG/5ML PO SOLN: 5.0000 mg | Freq: Once | ORAL | Status: AC | PRN

## 2022-03-22 MED ORDER — FENTANYL CITRATE (PF) 100 MCG/2ML IJ SOLN
INTRAMUSCULAR | Status: AC
  Filled 2022-03-22: qty 2

## 2022-03-22 MED ORDER — TRANEXAMIC ACID 1000 MG/10ML IV SOLN
INTRAVENOUS | Status: AC
  Filled 2022-03-22: qty 10

## 2022-03-22 MED ORDER — ADULT MULTIVITAMIN W/MINERALS CH
2.0000 | ORAL_TABLET | Freq: Every day | ORAL | Status: DC
  Administered 2022-03-24: 2 via ORAL
  Filled 2022-03-22 (×3): qty 2

## 2022-03-22 MED ORDER — FERROUS SULFATE 325 (65 FE) MG PO TABS
325.0000 mg | ORAL_TABLET | Freq: Every day | ORAL | Status: DC
  Administered 2022-03-24: 325 mg via ORAL
  Filled 2022-03-22 (×2): qty 1

## 2022-03-22 MED ORDER — 0.9 % SODIUM CHLORIDE (POUR BTL) OPTIME
TOPICAL | Status: DC | PRN
  Administered 2022-03-22: 500 mL

## 2022-03-22 MED ORDER — ONDANSETRON HCL 4 MG/2ML IJ SOLN
INTRAMUSCULAR | Status: AC
  Filled 2022-03-22: qty 2

## 2022-03-22 MED ORDER — CEFAZOLIN SODIUM-DEXTROSE 2-4 GM/100ML-% IV SOLN
2.0000 g | Freq: Four times a day (QID) | INTRAVENOUS | Status: AC
  Administered 2022-03-22 – 2022-03-23 (×3): 2 g via INTRAVENOUS
  Filled 2022-03-22 (×3): qty 100

## 2022-03-22 MED ORDER — PHENYLEPHRINE HCL-NACL 20-0.9 MG/250ML-% IV SOLN
INTRAVENOUS | Status: AC
  Filled 2022-03-22: qty 250

## 2022-03-22 MED ORDER — FLEET ENEMA 7-19 GM/118ML RE ENEM: 1.0000 | ENEMA | Freq: Once | RECTAL | Status: DC | PRN

## 2022-03-22 MED ORDER — PHENYLEPHRINE HCL-NACL 20-0.9 MG/250ML-% IV SOLN
INTRAVENOUS | Status: DC | PRN
  Administered 2022-03-22: 25 ug/min via INTRAVENOUS

## 2022-03-22 MED ORDER — CEFAZOLIN SODIUM-DEXTROSE 2-4 GM/100ML-% IV SOLN
INTRAVENOUS | Status: AC
  Filled 2022-03-22: qty 100

## 2022-03-22 MED ORDER — HYDROMORPHONE HCL 1 MG/ML IJ SOLN
0.5000 mg | INTRAMUSCULAR | Status: DC | PRN
  Administered 2022-03-22: 0.5 mg via INTRAVENOUS

## 2022-03-22 MED ORDER — ORAL CARE MOUTH RINSE: 15.0000 mL | Freq: Once | OROMUCOSAL | Status: AC

## 2022-03-22 MED ORDER — MAGNESIUM HYDROXIDE 400 MG/5ML PO SUSP: 30.0000 mL | Freq: Every day | ORAL | Status: DC | PRN

## 2022-03-22 MED ORDER — BUPIVACAINE HCL (PF) 0.5 % IJ SOLN
INTRAMUSCULAR | Status: AC
  Filled 2022-03-22: qty 10

## 2022-03-22 MED ORDER — OXYCODONE HCL 5 MG PO TABS
ORAL_TABLET | ORAL | Status: AC
  Administered 2022-03-22: 10 mg via ORAL
  Filled 2022-03-22: qty 2

## 2022-03-22 MED ORDER — DOCUSATE SODIUM 100 MG PO CAPS: 100.0000 mg | ORAL_CAPSULE | Freq: Two times a day (BID) | ORAL | Status: DC

## 2022-03-22 SURGICAL SUPPLY — 61 items
BLADE SAGITTAL WIDE XTHICK NO (BLADE) ×2 IMPLANT
BLADE SURG SZ20 CARB STEEL (BLADE) ×2 IMPLANT
CANISTER WOUND CARE 500ML ATS (WOUND CARE) ×1 IMPLANT
CHLORAPREP W/TINT 26 (MISCELLANEOUS) ×2 IMPLANT
DRAPE 3/4 80X56 (DRAPES) ×2 IMPLANT
DRAPE IMP U-DRAPE 54X76 (DRAPES) ×1 IMPLANT
DRAPE INCISE IOBAN 66X60 STRL (DRAPES) ×2 IMPLANT
DRAPE SURG 17X11 SM STRL (DRAPES) ×4 IMPLANT
DRSG MEPILEX SACRM 8.7X9.8 (GAUZE/BANDAGES/DRESSINGS) ×2 IMPLANT
DRSG OPSITE POSTOP 4X10 (GAUZE/BANDAGES/DRESSINGS) ×2 IMPLANT
ELECT BLADE 6.5 EXT (BLADE) ×1 IMPLANT
ELECT CAUTERY BLADE 6.4 (BLADE) ×2 IMPLANT
GAUZE 4X4 16PLY ~~LOC~~+RFID DBL (SPONGE) ×2 IMPLANT
GAUZE XEROFORM 1X8 LF (GAUZE/BANDAGES/DRESSINGS) ×2 IMPLANT
GLOVE BIO SURGEON STRL SZ7.5 (GLOVE) ×8 IMPLANT
GLOVE BIO SURGEON STRL SZ8 (GLOVE) ×8 IMPLANT
GLOVE BIOGEL PI IND STRL 8 (GLOVE) ×1 IMPLANT
GLOVE BIOGEL PI INDICATOR 8 (GLOVE) ×1
GLOVE SURG UNDER LTX SZ8 (GLOVE) ×2 IMPLANT
GOWN STRL REUS W/ TWL LRG LVL3 (GOWN DISPOSABLE) ×1 IMPLANT
GOWN STRL REUS W/ TWL XL LVL3 (GOWN DISPOSABLE) ×1 IMPLANT
GOWN STRL REUS W/TWL LRG LVL3 (GOWN DISPOSABLE) ×1
GOWN STRL REUS W/TWL XL LVL3 (GOWN DISPOSABLE) ×1
HEAD CERAMIC BIOLOX 36 T1 +3 (Head) ×1 IMPLANT
HOLSTER ELECTROSUGICAL PENCIL (MISCELLANEOUS) ×2 IMPLANT
HOOD PEEL AWAY FLYTE STAYCOOL (MISCELLANEOUS) ×6 IMPLANT
IV NS IRRIG 3000ML ARTHROMATIC (IV SOLUTION) ×2 IMPLANT
KIT PREVENA INCISION MGT20CM45 (CANNISTER) ×3 IMPLANT
KIT TURNOVER KIT A (KITS) ×2 IMPLANT
LINER ACE G7 36 HIGH WALL (Liner) ×1 IMPLANT
MANIFOLD NEPTUNE II (INSTRUMENTS) ×2 IMPLANT
MAT ABSORB  FLUID 56X50 GRAY (MISCELLANEOUS) ×1
MAT ABSORB FLUID 56X50 GRAY (MISCELLANEOUS) ×1 IMPLANT
NDL FILTER BLUNT 18X1 1/2 (NEEDLE) ×1 IMPLANT
NDL SAFETY ECLIPSE 18X1.5 (NEEDLE) ×2 IMPLANT
NDL SPNL 20GX3.5 QUINCKE YW (NEEDLE) ×1 IMPLANT
NEEDLE FILTER BLUNT 18X 1/2SAF (NEEDLE) ×1
NEEDLE FILTER BLUNT 18X1 1/2 (NEEDLE) ×1 IMPLANT
NEEDLE HYPO 18GX1.5 SHARP (NEEDLE) ×2
NEEDLE SPNL 20GX3.5 QUINCKE YW (NEEDLE) ×2 IMPLANT
PACK HIP PROSTHESIS (MISCELLANEOUS) ×2 IMPLANT
PENCIL SMOKE EVACUATOR (MISCELLANEOUS) ×2 IMPLANT
PIN STEINMAN 3/16 (PIN) ×2 IMPLANT
PIN STEINMANN 3/16X9 BAY 6PK (Pin) ×1 IMPLANT
PULSAVAC PLUS IRRIG FAN TIP (DISPOSABLE) ×2
SHELL ACETABULAR 3H G7 (Shell) ×1 IMPLANT
ST PIN 3/16X9 BAY 6PK (Pin) ×2 IMPLANT
STAPLER SKIN PROX 35W (STAPLE) ×2 IMPLANT
STEM FEMORAL SZ12 140MM (Stem) ×1 IMPLANT
SUT TICRON 2-0 30IN 311381 (SUTURE) ×6 IMPLANT
SUT VIC AB 0 CT1 36 (SUTURE) ×4 IMPLANT
SUT VIC AB 1 CT1 36 (SUTURE) ×2 IMPLANT
SUT VIC AB 2-0 CT1 (SUTURE) ×6 IMPLANT
SYR 10ML LL (SYRINGE) ×2 IMPLANT
SYR 20ML LL LF (SYRINGE) ×2 IMPLANT
SYR 30ML LL (SYRINGE) ×4 IMPLANT
TAPE TRANSPORE STRL 2 31045 (GAUZE/BANDAGES/DRESSINGS) ×2 IMPLANT
TIP FAN IRRIG PULSAVAC PLUS (DISPOSABLE) ×1 IMPLANT
TRAP FLUID SMOKE EVACUATOR (MISCELLANEOUS) ×2 IMPLANT
WATER STERILE IRR 1000ML POUR (IV SOLUTION) ×2 IMPLANT
WATER STERILE IRR 500ML POUR (IV SOLUTION) ×2 IMPLANT

## 2022-03-23 ENCOUNTER — Encounter: Payer: Self-pay | Admitting: Surgery

## 2022-03-23 DIAGNOSIS — Z87440 Personal history of urinary (tract) infections: Secondary | ICD-10-CM | POA: Diagnosis not present

## 2022-03-23 DIAGNOSIS — G4730 Sleep apnea, unspecified: Secondary | ICD-10-CM | POA: Diagnosis present

## 2022-03-23 DIAGNOSIS — Z90710 Acquired absence of both cervix and uterus: Secondary | ICD-10-CM | POA: Diagnosis not present

## 2022-03-23 DIAGNOSIS — E559 Vitamin D deficiency, unspecified: Secondary | ICD-10-CM | POA: Diagnosis present

## 2022-03-23 DIAGNOSIS — Z91040 Latex allergy status: Secondary | ICD-10-CM | POA: Diagnosis not present

## 2022-03-23 DIAGNOSIS — Z86711 Personal history of pulmonary embolism: Secondary | ICD-10-CM | POA: Diagnosis not present

## 2022-03-23 DIAGNOSIS — K5909 Other constipation: Secondary | ICD-10-CM | POA: Diagnosis present

## 2022-03-23 DIAGNOSIS — Z882 Allergy status to sulfonamides status: Secondary | ICD-10-CM | POA: Diagnosis not present

## 2022-03-23 DIAGNOSIS — Z8614 Personal history of Methicillin resistant Staphylococcus aureus infection: Secondary | ICD-10-CM | POA: Diagnosis not present

## 2022-03-23 DIAGNOSIS — Z85828 Personal history of other malignant neoplasm of skin: Secondary | ICD-10-CM | POA: Diagnosis not present

## 2022-03-23 DIAGNOSIS — Z86718 Personal history of other venous thrombosis and embolism: Secondary | ICD-10-CM | POA: Diagnosis not present

## 2022-03-23 DIAGNOSIS — M167 Other unilateral secondary osteoarthritis of hip: Secondary | ICD-10-CM | POA: Diagnosis present

## 2022-03-23 DIAGNOSIS — M879 Osteonecrosis, unspecified: Secondary | ICD-10-CM | POA: Diagnosis present

## 2022-03-23 DIAGNOSIS — Z880 Allergy status to penicillin: Secondary | ICD-10-CM | POA: Diagnosis not present

## 2022-03-23 DIAGNOSIS — M170 Bilateral primary osteoarthritis of knee: Secondary | ICD-10-CM | POA: Diagnosis present

## 2022-03-23 DIAGNOSIS — Z885 Allergy status to narcotic agent status: Secondary | ICD-10-CM | POA: Diagnosis not present

## 2022-03-23 DIAGNOSIS — I341 Nonrheumatic mitral (valve) prolapse: Secondary | ICD-10-CM | POA: Diagnosis present

## 2022-03-23 DIAGNOSIS — Z881 Allergy status to other antibiotic agents status: Secondary | ICD-10-CM | POA: Diagnosis not present

## 2022-03-23 LAB — CBC
HCT: 25.5 % — ABNORMAL LOW (ref 36.0–46.0)
Hemoglobin: 8.3 g/dL — ABNORMAL LOW (ref 12.0–15.0)
MCH: 28.2 pg (ref 26.0–34.0)
MCHC: 32.5 g/dL (ref 30.0–36.0)
MCV: 86.7 fL (ref 80.0–100.0)
Platelets: 180 10*3/uL (ref 150–400)
RBC: 2.94 MIL/uL — ABNORMAL LOW (ref 3.87–5.11)
RDW: 14.2 % (ref 11.5–15.5)
WBC: 6.2 10*3/uL (ref 4.0–10.5)
nRBC: 0 % (ref 0.0–0.2)

## 2022-03-23 LAB — BASIC METABOLIC PANEL
Anion gap: 5 (ref 5–15)
BUN: 6 mg/dL — ABNORMAL LOW (ref 8–23)
CO2: 24 mmol/L (ref 22–32)
Calcium: 7.8 mg/dL — ABNORMAL LOW (ref 8.9–10.3)
Chloride: 103 mmol/L (ref 98–111)
Creatinine, Ser: 0.43 mg/dL — ABNORMAL LOW (ref 0.44–1.00)
GFR, Estimated: >60 mL/min (ref >60)
Glucose, Bld: 113 mg/dL — ABNORMAL HIGH (ref 70–99)
Potassium: 3.9 mmol/L (ref 3.5–5.1)
Sodium: 132 mmol/L — ABNORMAL LOW (ref 135–145)

## 2022-03-23 LAB — SURGICAL PATHOLOGY

## 2022-03-23 MED ORDER — ENSURE ENLIVE PO LIQD: 237.0000 mL | Freq: Three times a day (TID) | ORAL | Status: DC

## 2022-03-23 MED ORDER — ONDANSETRON HCL 4 MG/2ML IJ SOLN
4.0000 mg | Freq: Four times a day (QID) | INTRAMUSCULAR | Status: DC | PRN
  Administered 2022-03-23 – 2022-03-24 (×2): 4 mg via INTRAVENOUS
  Filled 2022-03-23 (×2): qty 2

## 2022-03-23 MED ORDER — ONDANSETRON HCL 4 MG PO TABS: 4.0000 mg | ORAL_TABLET | Freq: Four times a day (QID) | ORAL | Status: DC | PRN

## 2022-03-23 MED ORDER — HYDROMORPHONE HCL 2 MG PO TABS
1.0000 mg | ORAL_TABLET | Freq: Four times a day (QID) | ORAL | Status: DC | PRN
  Administered 2022-03-23: 1 mg via ORAL
  Administered 2022-03-24 (×2): 2 mg via ORAL
  Filled 2022-03-23 (×3): qty 1

## 2022-03-23 NOTE — Anesthesia Postprocedure Evaluation (Signed)
Anesthesia Post Note  Patient: Jya Hughston  Procedure(s) Performed: TOTAL HIP ARTHROPLASTY (Right: Hip)  Patient location during evaluation: Nursing Unit Anesthesia Type: Spinal Level of consciousness: oriented and awake and alert Pain management: pain level controlled Vital Signs Assessment: post-procedure vital signs reviewed and stable Respiratory status: spontaneous breathing Cardiovascular status: blood pressure returned to baseline and stable Postop Assessment: no headache, no backache, no apparent nausea or vomiting and patient able to bend at knees Anesthetic complications: no   No notable events documented.   Last Vitals:  Vitals:   03/23/22 0600 03/23/22 0741  BP: (!) 100/57 (!) 110/59  Pulse: 80 83  Resp: 18 16  Temp: 37.1 C 36.8 C  SpO2: 97% 97%    Last Pain:  Vitals:   03/23/22 0600  TempSrc: Oral  PainSc:                  Precious Haws Dilynn Munroe

## 2022-03-23 NOTE — Progress Notes (Signed)
Physical Therapy Treatment Patient Details Name: Dana Bautista MRN: [id number] DOB: Dec 26, 1945 Today's Date: 03/23/2022   History of Present Illness admitted for acute hospitalization s/p R THR, posterior approach, WBAT (03/22/22)    PT Comments    Pt was seen for PM session. She was sitting in recliner and states she was able to eat a few bites of her lunch. She requested to use BR prior to ambulation. Successfully urinated on BSC prior to standing and ambulating ~ 60 ft with extremely slow, antalgic, step to pattern. Pt endorses feeling hot/ nauseous throughout all standing activity. Chief Strategy Officer discussed importance of increasing activity as able. She performed HEP exercises with min assist.   Pt is refusing rehab placement and plans to return home at DC with spouse assist. Highly recommend HHPT at DC to address deficits while maximizing independence with ADLs.    Recommendations for follow up therapy are one component of a multi-disciplinary discharge planning process, led by the attending physician.  Recommendations may be updated based on patient status, additional functional criteria and insurance authorization.  Follow Up Recommendations  Home health PT     Assistance Recommended at Discharge Frequent or constant Supervision/Assistance  Patient can return home with the following A little help with walking and/or transfers;A little help with bathing/dressing/bathroom   Equipment Recommendations  Rolling walker (2 wheels);BSC/3in1       Precautions / Restrictions Precautions Precautions: Posterior Hip;Fall Precaution Booklet Issued: Yes (comment) Restrictions Weight Bearing Restrictions: Yes RLE Weight Bearing: Weight bearing as tolerated     Mobility  Bed Mobility Overal bed mobility: Needs Assistance Bed Mobility: Sit to Supine  Supine to sit: Mod assist, HOB elevated Sit to supine: Mod assist   General bed mobility comments: pt required mod assist to safely return to supine in  bed. increased time required due to pain    Transfers Overall transfer level: Needs assistance Equipment used: Rolling walker (2 wheels) Transfers: Sit to/from Stand Sit to Stand: Min assist, Min guard    General transfer comment: pt stood form recliner surface with min assist however only required CGA to stand form BSC and elevated bed height. vcs for handplacement and technique    Ambulation/Gait Ambulation/Gait assistance: Min guard Gait Distance (Feet): 60 Feet Assistive device: Rolling walker (2 wheels) Gait Pattern/deviations: Step-to pattern, Antalgic Gait velocity: decreased     General Gait Details: pt has very slow, antalgic step to gait pattern. she is limited by fatigue and nausea. no LOB but pt did somewhat self limit throughout session.    Balance Overall balance assessment: Needs assistance Sitting-balance support: No upper extremity supported, Feet supported Sitting balance-Leahy Scale: Good     Standing balance support: Bilateral upper extremity supported Standing balance-Leahy Scale: Fair      Cognition Arousal/Alertness: Awake/alert Behavior During Therapy: WFL for tasks assessed/performed, Anxious Overall Cognitive Status: Within Functional Limits for tasks assessed      General Comments: pt is A and O x4        Exercises Total Joint Exercises Ankle Circles/Pumps: AROM, 10 reps Quad Sets: AROM, 10 reps Gluteal Sets: AROM, 10 reps Heel Slides: AAROM, 5 reps (through limited ROM) Hip ABduction/ADduction: AAROM, 5 reps        Pertinent Vitals/Pain Pain Assessment Pain Assessment: 0-10 Pain Score: 8  Faces Pain Scale: Hurts whole lot Pain Location: R hip Pain Descriptors / Indicators: Aching, Grimacing, Guarding Pain Intervention(s): Limited activity within patient's tolerance, Monitored during session, Premedicated before session, Repositioned, Ice applied    Home  Living Family/patient expects to be discharged to:: Private  residence Living Arrangements: Spouse/significant other Available Help at Discharge: Family;Available PRN/intermittently Type of Home: House Home Access: Stairs to enter   [employer] of Steps: 2 in garage, no rails; 4-5 on side and front, bilat rails (wide, but able to touch both)   Home Layout: One level Home Equipment: Rollator (4 wheels);Toilet riser          PT Goals (current goals can now be found in the care plan section) Acute Rehab PT Goals Patient Stated Goal: to return home Progress towards PT goals: Progressing toward goals    Frequency    BID      PT Plan Current plan remains appropriate       AM-PAC PT "6 Clicks" Mobility   Outcome Measure  Help needed turning from your back to your side while in a flat bed without using bedrails?: A Little Help needed moving from lying on your back to sitting on the side of a flat bed without using bedrails?: A Lot Help needed moving to and from a bed to a chair (including a wheelchair)?: A Little Help needed standing up from a chair using your arms (e.g., wheelchair or bedside chair)?: A Little Help needed to walk in hospital room?: A Little Help needed climbing 3-5 steps with a railing? : A Little 6 Click Score: 17    End of Session   Activity Tolerance: Patient tolerated treatment well;Patient limited by pain Patient left: in bed;with call bell/phone within reach;with bed alarm set;with SCD's reapplied Nurse Communication: Mobility status PT Visit Diagnosis: Muscle weakness (generalized) (M62.81);Other abnormalities of gait and mobility (R26.89);Pain Pain - Right/Left: Right Pain - part of body: Hip     Time: 9937-1696 PT Time Calculation (min) (ACUTE ONLY): 27 min  Charges:  $Gait Training: 8-22 mins $Therapeutic Exercise: 8-22 mins                    Julaine Fusi PTA 03/23/22, 1:43 PM

## 2022-03-23 NOTE — Progress Notes (Signed)
Initial Nutrition Assessment  DOCUMENTATION CODES:   Obesity unspecified  INTERVENTION:   -Ensure Enlive po TID, each supplement provides 350 kcal and 20 grams of protein -MVI with minerals daily  NUTRITION DIAGNOSIS:   Increased nutrient needs related to post-op healing as evidenced by estimated needs.  GOAL:   Patient will meet greater than or equal to 90% of their needs  MONITOR:   PO intake, Supplement acceptance  REASON FOR ASSESSMENT:   Malnutrition Screening Tool    ASSESSMENT:   Pt with PMH of mitral valve regurgitation, UTI, PE, DVT, pre-diabetes,a nd vitamin -12 deficiency admitted for rt hip arthroplasty.  Pt admitted with osteonecrosis of rt hip.   6/27- s/p rt total hip arthoplasty  Reviewed I/O's: +352 ml x 24 hours  UOP: 1.9 L x 24 hours  Pt unavailable at time of visit. Attempted to speak with pt via call to hospital room phone, however, unable to reach. RD unable to obtain further nutrition-related history or complete nutrition-focused physical exam at this time.     Pt currently on a regular diet. Noted meal completions 50%.  Reviewed wt hx; pt has experienced a 9.8% wt loss over the past 3 months, which is significant for time frame.   Per orthopedics notes, likely plan to discharge home tomorrow with home health services.   Pt with increased nutritional needs for post-operative healing and would benefit from addition of oral nutrition supplements.   Medications reviewed and include vitamin B-12, ferrous sulfate, and 0.9% sodium chloride infusion @ 75 ml/hr.   Labs reviewed: Na: 132, CBGS: 96.   Diet Order:   Diet Order             Diet regular Room service appropriate? Yes; Fluid consistency: Thin  Diet effective now                   EDUCATION NEEDS:   No education needs have been identified at this time  Skin:  Skin Assessment: Skin Integrity Issues: Skin Integrity Issues:: Wound VAC Wound Vac: rt hip  Last BM:   03/21/22  Height:   Ht Readings from Last 1 Encounters:  03/22/22 '5\' 6"'$  (1.676 m)    Weight:   Wt Readings from Last 1 Encounters:  03/22/22 87.1 kg    Ideal Body Weight:  59.1 kg  BMI:  Body mass index is 30.99 kg/m.  Estimated Nutritional Needs:   Kcal:  1850-2050  Protein:  105-120 grams  Fluid:  > 1.8 L    Loistine Chance, RD, LDN, [place] Registered Dietitian II Certified Diabetes Care and Education Specialist Please refer to [facility] for RD and/or RD on-call/weekend/after hours pager

## 2022-03-23 NOTE — Progress Notes (Signed)
Subjective: 1 Day Post-Op Procedure(s) (LRB): TOTAL HIP ARTHROPLASTY (Right) Patient reports pain as moderate.   Patient is  well but has been experiencing significant nausea following surgery. Plan is to go Home after hospital stay.  PT and care management to assist with discharge planning. Negative for chest pain and shortness of breath Fever: no Gastrointestinal:Negative  for vomiting this morning but does still report significant nausea.  Objective: Vital signs in last 24 hours: Temp:  [97.3 F (36.3 C)-98.8 F (37.1 C)] 98.3 F (36.8 C) (06/28 0741) Pulse Rate:  [66-101] 83 (06/28 0741) Resp:  [8-26] 16 (06/28 0741) BP: (78-142)/(44-96) 110/59 (06/28 0741) SpO2:  [88 %-100 %] 97 % (06/28 0741)  Intake/Output from previous day:  Intake/Output Summary (Last 24 hours) at 03/23/2022 0746 Last data filed at 03/23/2022 9485 Gross per 24 hour  Intake 2652.33 ml  Output 2300 ml  Net 352.33 ml    Intake/Output this shift: No intake/output data recorded.  Labs: Recent Labs    03/23/22 0347  HGB 8.3*   Recent Labs    03/23/22 0347  WBC 6.2  RBC 2.94*  HCT 25.5*  PLT 180   Recent Labs    03/23/22 0347  NA 132*  K 3.9  CL 103  CO2 24  BUN 6*  CREATININE 0.43*  GLUCOSE 113*  CALCIUM 7.8*   No results for input(s): "LABPT", "INR" in the last 72 hours.   EXAM General - Patient is Alert, Appropriate, and Oriented Extremity - ABD soft Neurovascular intact Dorsiflexion/Plantar flexion intact Incision: Prevena woundvac intact without leak No cellulitis present Dressing/Incision - Woundvac intact without any drainage noted. Motor Function - intact, moving foot and toes well on exam.  Abdomen soft with intact bowel sounds.  Past Medical History:  Diagnosis Date   Anemia    Arthritis    osteoarthriist   Cancer ([place])    skin   Carotid stenosis, asymptomatic, bilateral    Cellulitis    Complication of anesthesia    nausea   DVT (deep venous thrombosis)  ([place]) 09/2019   Heart murmur    History of methicillin resistant staphylococcus aureus (MRSA) 2020   Mitral valve regurgitation    Pericardial effusion    Pleural effusion    PONV (postoperative nausea and vomiting)    Pre-diabetes    Pulmonary embolism ([place]) 2020   Sleep apnea    uses cpap   Tendonitis    outer aspect of right foot   Thyroid nodule    UTI (lower urinary tract infection)    Vitamin B 12 deficiency     Assessment/Plan: 1 Day Post-Op Procedure(s) (LRB): TOTAL HIP ARTHROPLASTY (Right) Principal Problem:   Status post total hip replacement, right  Estimated body mass index is 30.99 kg/m as calculated from the following:   Height as of this encounter: '5\' 6"'$  (1.676 m).   Weight as of this encounter: 87.1 kg. Advance diet Up with therapy D/C IV fluids when tolerating po intake.  Labs reviewed, BUN 6 and Cr 0.43, likely from patients episodes of vomiting recently.  Encouraged her to increase her oral food and liquid intake today. Patient has history of N/V with Oxycodone and hydrocodone.  Will place on low dose dilaudid to see if this helps with pain while also controlling her nausea Up with therapy today, plan will be for discharge home after completing necessary tasks with PT. Probable discharge home tomorrow pending progress today.  DVT Prophylaxis - TED hose and Eliquis, SCDs Weight-Bearing as  tolerated to right leg  J. Cameron Proud, PA-C [facility] 03/23/2022, 7:46 AM

## 2022-03-23 NOTE — Progress Notes (Signed)
Spoke with the patient to discuss DC Plan and needs She has a rollator and toilet riser at home [employer] will deliver a rolling walker and 3 in 1 to the bedside to take home She is set up with [facility] for [facility] She has transportation with her husband

## 2022-03-23 NOTE — Plan of Care (Signed)
?  Problem: Clinical Measurements: ?Goal: Will remain free from infection ?Outcome: Progressing ?  ?

## 2022-03-23 NOTE — Evaluation (Signed)
Occupational Therapy Evaluation Patient Details Name: Dana Bautista MRN: [id number] DOB: Jul 02, 1946 Today's Date: 03/23/2022   History of Present Illness admitted for acute hospitalization s/p R THR, posterior approach, WBAT (03/22/22)   Clinical Impression   Patient presenting with decreased Ind in self care, balance, functional mobility/transfers, endurance, and safety awareness. Patient reports being Ind at baseline and living with husband. Pt needing min cuing for recall of hip precautions and further explanation related to self care and management of 90 degrees. Upon entering the room, pt with urgent need for toileting. Min guard- min A for functional transfer to [facility] with RW. OT reviewed use of reacher to don mesh underwear with pt demonstrating and needing min A for balance when standing to pull over B hips. OT also answered questions family had about bed height and car for transfer at discharge. Pt returning to recliner chair and reports nausea but no vomiting this session. Pt is encouraged to eat secondary to taking pain medications and she placed order for lunch. Pt's biggest barrier at this time is stairs to enter home and ambulation of household distance prior to discharge. Patient will benefit from acute OT to increase overall independence in the areas of ADLs, functional mobility, and safety awareness in order to safely discharge home with family.     Recommendations for follow up therapy are one component of a multi-disciplinary discharge planning process, led by the attending physician.  Recommendations may be updated based on patient status, additional functional criteria and insurance authorization.   Follow Up Recommendations  Home health OT    Assistance Recommended at Discharge Intermittent Supervision/Assistance  Patient can return home with the following A little help with walking and/or transfers;A little help with bathing/dressing/bathroom;Help with stairs or ramp for  entrance;Assistance with cooking/housework    Functional Status Assessment  Patient has had a recent decline in their functional status and demonstrates the ability to make significant improvements in function in a reasonable and predictable amount of time.  Equipment Recommendations  BSC/3in1       Precautions / Restrictions Precautions Precautions: Posterior Hip;Fall Restrictions Weight Bearing Restrictions: Yes RLE Weight Bearing: Weight bearing as tolerated      Mobility Bed Mobility               General bed mobility comments: seated in recliner chair    Transfers Overall transfer level: Needs assistance Equipment used: Rolling walker (2 wheels) Transfers: Sit to/from Stand Sit to Stand: Min assist Stand pivot transfers: Min assist         General transfer comment: VCs for handplacement and technique while adhering to precautions.      Balance Overall balance assessment: Needs assistance Sitting-balance support: No upper extremity supported, Feet supported Sitting balance-Leahy Scale: Good     Standing balance support: Bilateral upper extremity supported Standing balance-Leahy Scale: Fair                             ADL either performed or assessed with clinical judgement   ADL Overall ADL's : Needs assistance/impaired                     Lower Body Dressing: Minimal assistance Lower Body Dressing Details (indicate cue type and reason): to don mesh underwear with use of reacher with min guard - min A for standing balance Toilet Transfer: BSC/3in1;Rolling walker (2 wheels)   Toileting- Clothing Manipulation and Hygiene: Minimal assistance;Sit to/from stand  Functional mobility during ADLs: Min guard;Minimal assistance;Rolling walker (2 wheels)       Vision Baseline Vision/History: 1 Wears glasses Ability to See in Adequate Light: 0 Adequate Patient Visual Report: No change from baseline              Pertinent  Vitals/Pain Pain Assessment Pain Assessment: No/denies pain Faces Pain Scale: Hurts even more Pain Location: R hip Pain Descriptors / Indicators: Aching, Grimacing, Guarding Pain Intervention(s): Premedicated before session, Monitored during session, Repositioned     Hand Dominance Right   Extremity/Trunk Assessment Upper Extremity Assessment Upper Extremity Assessment: Overall WFL for tasks assessed   Lower Extremity Assessment Lower Extremity Assessment: Defer to PT evaluation       Communication Communication Communication: HOH   Cognition Arousal/Alertness: Awake/alert Behavior During Therapy: WFL for tasks assessed/performed, Anxious Overall Cognitive Status: Within Functional Limits for tasks assessed                                 General Comments: pt is A and O x4     General Comments  reviewed ther ex that she need to be performing, reviewed hip precautions, and encouraged pt eat some breakfast            Home Living Family/patient expects to be discharged to:: Private residence Living Arrangements: Spouse/significant other Available Help at Discharge: Family;Available PRN/intermittently Type of Home: House Home Access: Stairs to enter [employer] of Steps: 2 in garage, no rails; 4-5 on side and front, bilat rails (wide, but able to touch both)   Home Layout: One level     Bathroom Shower/Tub: Walk-in shower;Tub/shower unit         Home Equipment: Rollator (4 wheels);Toilet riser          Prior Functioning/Environment Prior Level of Function : Independent/Modified Independent             Mobility Comments: Mod indep with ADLs, household and limited community mobilization with [phone]; denies recent fall history ADLs Comments: Ind with self care tasks        OT Problem List: Decreased knowledge of use of DME or AE;Decreased strength;Decreased knowledge of precautions;Decreased activity tolerance;Decreased safety  awareness;Impaired balance (sitting and/or standing);Pain      OT Treatment/Interventions: Self-care/ADL training;Manual therapy;Therapeutic exercise;Modalities;Patient/family education;Balance training;Energy conservation;Therapeutic activities;DME and/or AE instruction    OT Goals(Current goals can be found in the care plan section) Acute Rehab OT Goals Patient Stated Goal: to go home and decrease pain OT Goal Formulation: With patient/family Time For Goal Achievement: 04/07/22 Potential to Achieve Goals: Good ADL Goals Pt Will Perform Grooming: with modified independence Pt Will Perform Lower Body Dressing: with modified independence Pt Will Transfer to Toilet: with modified independence Pt Will Perform Toileting - Clothing Manipulation and hygiene: with modified independence  OT Frequency: Min 2X/week       AM-PAC OT "6 Clicks" Daily Activity     Outcome Measure Help from another person eating meals?: None Help from another person taking care of personal grooming?: A Little Help from another person toileting, which includes using toliet, bedpan, or urinal?: A Little Help from another person bathing (including washing, rinsing, drying)?: A Little Help from another person to put on and taking off regular upper body clothing?: None Help from another person to put on and taking off regular lower body clothing?: A Little 6 Click Score: 20   End of Session Equipment Utilized During Treatment:  Rolling walker (2 wheels) Nurse Communication: Mobility status  Activity Tolerance: Other (comment) (Pt is very anxious) Patient left: with call bell/phone within reach;in chair;with chair alarm set  OT Visit Diagnosis: Unsteadiness on feet (R26.81);Muscle weakness (generalized) (M62.81)                Time: 8416-6063 OT Time Calculation (min): 27 min Charges:  OT General Charges $OT Visit: 1 Visit OT Evaluation $OT Eval Moderate Complexity: 1 Mod OT Treatments $Self Care/Home Management  : 8-22 mins  Darleen Crocker, MS, OTR/L , CBIS ascom [phone]  03/23/22, 1:16 PM

## 2022-03-23 NOTE — Progress Notes (Signed)
Physical Therapy Treatment Patient Details Name: Dana Bautista MRN: [id number] DOB: Feb 22, 1946 Today's Date: 03/23/2022   History of Present Illness admitted for acute hospitalization s/p R THR, posterior approach, WBAT (03/22/22)    PT Comments    Pt was long sitting in bed upon arriving. She endorses severe pain but was agreeable to OOB. She required extensive assistance to exit bed due to pain. Sat EOB for a few minutes prior to standing and ambulating ~ 15 ft to [facility]. Successfully urinated prior to standing and ambulating to recliner. Pt is nauseous throughout all standing activity. Dry heaving holding emesis bag throughout session but no active vomiting. RN/Mds aware and medicine were adjusted. Will return later today and continue to follow per current POC. Pt does not want rehab at DC. Will adjust recs if unable to tolerate more activity this afternoon.    Recommendations for follow up therapy are one component of a multi-disciplinary discharge planning process, led by the attending physician.  Recommendations may be updated based on patient status, additional functional criteria and insurance authorization.  Follow Up Recommendations  Home health PT     Assistance Recommended at Discharge Intermittent Supervision/Assistance  Patient can return home with the following A little help with walking and/or transfers;A little help with bathing/dressing/bathroom   Equipment Recommendations  Rolling walker (2 wheels);BSC/3in1       Precautions / Restrictions Precautions Precautions: Posterior Hip;Fall Restrictions Weight Bearing Restrictions: Yes RLE Weight Bearing: Weight bearing as tolerated     Mobility  Bed Mobility Overal bed mobility: Needs Assistance Bed Mobility: Supine to Sit  Supine to sit: Mod assist, HOB elevated  General bed mobility comments: Increased time to perform + mod assist to progress RLE. very pain limited.    Transfers Overall transfer level: Needs  assistance Equipment used: Rolling walker (2 wheels) Transfers: Sit to/from Stand Sit to Stand: Min assist    General transfer comment: pt was able to stand from EOB and from [facility] with min assist. VCs for handplacement and technique while adhering to precautions.    Ambulation/Gait Ambulation/Gait assistance: Min guard Gait Distance (Feet): 15 Feet Assistive device: Rolling walker (2 wheels) Gait Pattern/deviations: Step-to pattern, Antalgic Gait velocity: decreased  General Gait Details: pt has very slow antalgic step to pattern. Pain limited then gets very nausous. Dry heaving holding emisis bag    Balance Overall balance assessment: Needs assistance Sitting-balance support: No upper extremity supported, Feet supported Sitting balance-Leahy Scale: Good     Standing balance support: Bilateral upper extremity supported Standing balance-Leahy Scale: Fair         Cognition Arousal/Alertness: Awake/alert Behavior During Therapy: WFL for tasks assessed/performed, Anxious Overall Cognitive Status: Within Functional Limits for tasks assessed          General Comments: pt is A and O x4           General Comments General comments (skin integrity, edema, etc.): reviewed ther ex that she need to be performing, reviewed hip precautions, and encouraged pt eat some breakfast      Pertinent Vitals/Pain Pain Assessment Pain Assessment: 0-10 Pain Score: 8  Faces Pain Scale: Hurts whole lot Pain Location: R hip Pain Descriptors / Indicators: Aching, Grimacing, Guarding Pain Intervention(s): Limited activity within patient's tolerance, Monitored during session, Premedicated before session, Repositioned     PT Goals (current goals can now be found in the care plan section) Acute Rehab PT Goals Patient Stated Goal: to return home Progress towards PT goals: Progressing toward goals  Frequency    BID      PT Plan Current plan remains appropriate       AM-PAC PT "6  Clicks" Mobility   Outcome Measure  Help needed turning from your back to your side while in a flat bed without using bedrails?: A Little Help needed moving from lying on your back to sitting on the side of a flat bed without using bedrails?: A Lot Help needed moving to and from a bed to a chair (including a wheelchair)?: A Little Help needed standing up from a chair using your arms (e.g., wheelchair or bedside chair)?: A Little Help needed to walk in hospital room?: A Little Help needed climbing 3-5 steps with a railing? : A Lot 6 Click Score: 16    End of Session Equipment Utilized During Treatment: Gait belt Activity Tolerance: Patient tolerated treatment well Patient left: in chair;with call bell/phone within reach Nurse Communication: Mobility status PT Visit Diagnosis: Muscle weakness (generalized) (M62.81);Other abnormalities of gait and mobility (R26.89);Pain Pain - Right/Left: Right Pain - part of body: Hip     Time: 0349-1791 PT Time Calculation (min) (ACUTE ONLY): 29 min  Charges:  $Gait Training: 8-22 mins $Therapeutic Activity: 8-22 mins          Julaine Fusi PTA 03/23/22, 9:17 AM

## 2022-03-24 ENCOUNTER — Encounter: Payer: Self-pay | Admitting: Surgery

## 2022-03-24 LAB — CBC
HCT: 26.5 % — ABNORMAL LOW (ref 36.0–46.0)
Hemoglobin: 8.7 g/dL — ABNORMAL LOW (ref 12.0–15.0)
MCH: 28.2 pg (ref 26.0–34.0)
MCHC: 32.8 g/dL (ref 30.0–36.0)
MCV: 86 fL (ref 80.0–100.0)
Platelets: 196 10*3/uL (ref 150–400)
RBC: 3.08 MIL/uL — ABNORMAL LOW (ref 3.87–5.11)
RDW: 14.1 % (ref 11.5–15.5)
WBC: 9.7 10*3/uL (ref 4.0–10.5)
nRBC: 0 % (ref 0.0–0.2)

## 2022-03-24 MED ORDER — ONDANSETRON HCL 4 MG PO TABS: 4.0000 mg | ORAL_TABLET | Freq: Four times a day (QID) | ORAL | 0 refills | Status: DC | PRN

## 2022-03-24 MED ORDER — HYDROMORPHONE HCL 2 MG PO TABS: 1.0000 mg | ORAL_TABLET | Freq: Four times a day (QID) | ORAL | 0 refills | Status: DC | PRN

## 2022-03-24 MED ORDER — GUAIFENESIN ER 600 MG PO TB12
600.0000 mg | ORAL_TABLET | Freq: Two times a day (BID) | ORAL | Status: DC
  Administered 2022-03-24: 600 mg via ORAL
  Filled 2022-03-24: qty 1

## 2022-03-24 NOTE — Progress Notes (Signed)
Occupational Therapy Treatment Patient Details Name: Dana Bautista MRN: [id number] DOB: 07-Apr-1946 Today's Date: 03/24/2022   History of present illness admitted for acute hospitalization s/p R THR, posterior approach, WBAT (03/22/22)   OT comments  Upon entering the room, pt seated in recliner chair and agreeable to OT intervention. Pt with plans to discharge home today and further education and practice with self care tasks this session. Pt dons pull over shirt with set up A . Pt able to demonstrate donning LB clothing without cues and use of reacher. Sit <>stand with supervision and able to pull over B hips. Pt feels good about discharge and self care needs. No further questions at this time.    Recommendations for follow up therapy are one component of a multi-disciplinary discharge planning process, led by the attending physician.  Recommendations may be updated based on patient status, additional functional criteria and insurance authorization.    Follow Up Recommendations  Home health OT    Assistance Recommended at Discharge Intermittent Supervision/Assistance  Patient can return home with the following  A little help with walking and/or transfers;A little help with bathing/dressing/bathroom;Help with stairs or ramp for entrance;Assistance with cooking/housework   Equipment Recommendations  BSC/3in1       Precautions / Restrictions Precautions Precautions: Posterior Hip;Fall Restrictions Weight Bearing Restrictions: Yes RLE Weight Bearing: Weight bearing as tolerated       Mobility Bed Mobility               General bed mobility comments: seated in recliner chair at beginning/end of session    Transfers Overall transfer level: Needs assistance Equipment used: Rolling walker (2 wheels) Transfers: Sit to/from Stand Sit to Stand: Supervision                 Balance Overall balance assessment: Needs assistance Sitting-balance support: No upper extremity  supported, Feet supported Sitting balance-Leahy Scale: Good     Standing balance support: Bilateral upper extremity supported, Reliant on assistive device for balance Standing balance-Leahy Scale: Fair                             ADL either performed or assessed with clinical judgement   ADL Overall ADL's : Needs assistance/impaired                                       General ADL Comments: supervision overall with min cuing for technique and use of LH reacher to don underwear and pants while seated on edge of recliner chair. Set up A for UB clothing.    Extremity/Trunk Assessment Upper Extremity Assessment Upper Extremity Assessment: Overall WFL for tasks assessed   Lower Extremity Assessment Lower Extremity Assessment: Defer to PT evaluation        Vision Patient Visual Report: No change from baseline            Cognition Arousal/Alertness: Awake/alert Behavior During Therapy: WFL for tasks assessed/performed, Anxious Overall Cognitive Status: Within Functional Limits for tasks assessed                                 General Comments: pt is A and O x4 and is much less anxious about mobility and functional tasks  Pertinent Vitals/ Pain       Pain Assessment Pain Assessment: Faces Faces Pain Scale: Hurts little more Pain Location: R hip Pain Descriptors / Indicators: Aching, Discomfort Pain Intervention(s): Monitored during session, Premedicated before session, Repositioned, Ice applied         Frequency  Min 2X/week        Progress Toward Goals  OT Goals(current goals can now be found in the care plan section)  Progress towards OT goals: Progressing toward goals  Acute Rehab OT Goals Patient Stated Goal: to go home OT Goal Formulation: With patient Time For Goal Achievement: 04/07/22 Potential to Achieve Goals: Good  Plan Discharge plan remains appropriate;Frequency remains  appropriate       AM-PAC OT "6 Clicks" Daily Activity     Outcome Measure   Help from another person eating meals?: None Help from another person taking care of personal grooming?: None Help from another person toileting, which includes using toliet, bedpan, or urinal?: A Little Help from another person bathing (including washing, rinsing, drying)?: A Little Help from another person to put on and taking off regular upper body clothing?: None Help from another person to put on and taking off regular lower body clothing?: A Little 6 Click Score: 21    End of Session Equipment Utilized During Treatment: Rolling walker (2 wheels)  OT Visit Diagnosis: Unsteadiness on feet (R26.81);Muscle weakness (generalized) (M62.81)   Activity Tolerance Patient tolerated treatment well   Patient Left with call bell/phone within reach;in chair;with chair alarm set   Nurse Communication Mobility status        Time: [phone] OT Time Calculation (min): 19 min  Charges: OT General Charges $OT Visit: 1 Visit OT Treatments $Self Care/Home Management : 8-22 mins  Darleen Crocker, MS, OTR/L , CBIS ascom [phone]  03/24/22, 1:17 PM

## 2022-03-24 NOTE — Progress Notes (Signed)
Physical Therapy Treatment Patient Details Name: Dana Bautista MRN: [id number] DOB: Apr 19, 1946 Today's Date: 03/24/2022   History of Present Illness admitted for acute hospitalization s/p R THR, posterior approach, WBAT (03/22/22)    PT Comments    Pt was long sitting in bed upon arriving. She agrees to session and is motivated to improve so she can be able to go home. Required a little assistance to exit bed however was able to stand, ambulate and perform stairs with very little. Pt will have 24 hour assistance at DC is feels she can safely return home later this afternoon. Author will return later this AM to advance gait distances and progress pt prior to DC home. Recommend HHPT at DC to maximize independence while assisting pt to PLOF.    Recommendations for follow up therapy are one component of a multi-disciplinary discharge planning process, led by the attending physician.  Recommendations may be updated based on patient status, additional functional criteria and insurance authorization.  Follow Up Recommendations  Home health PT     Assistance Recommended at Discharge Frequent or constant Supervision/Assistance  Patient can return home with the following A little help with walking and/or transfers;A little help with bathing/dressing/bathroom   Equipment Recommendations  Rolling walker (2 wheels);BSC/3in1       Precautions / Restrictions Precautions Precautions: Posterior Hip;Fall Precaution Booklet Issued: Yes (comment) Restrictions Weight Bearing Restrictions: Yes RLE Weight Bearing: Weight bearing as tolerated     Mobility  Bed Mobility Overal bed mobility: Needs Assistance Bed Mobility: Supine to Sit  Supine to sit: Min assist, HOB elevated  General bed mobility comments: Min assist to progressing LE to EOB    Transfers Overall transfer level: Needs assistance Equipment used: Rolling walker (2 wheels) Transfers: Sit to/from Stand Sit to Stand: Supervision    Ambulation/Gait Ambulation/Gait assistance: Min guard Gait Distance (Feet): 45 Feet Assistive device: Rolling walker (2 wheels) Gait Pattern/deviations: Step-through pattern, Antalgic  General Gait Details: Pt was able to ambulate ~ 45 ft   Stairs Stairs: Yes Stairs assistance: Min guard, Min assist Stair Management: One rail Right, Sideways, Step to pattern Number of Stairs: 4 General stair comments: Pt was able to ambulate up/down 4 stairs without CGA/min assist. no difficulty or safety concerns.     Balance Overall balance assessment: Needs assistance Sitting-balance support: No upper extremity supported, Feet supported Sitting balance-Leahy Scale: Good     Standing balance support: Bilateral upper extremity supported, Reliant on assistive device for balance Standing balance-Leahy Scale: Fair       Cognition Arousal/Alertness: Awake/alert Behavior During Therapy: WFL for tasks assessed/performed, Anxious Overall Cognitive Status: Within Functional Limits for tasks assessed       Exercises Total Joint Exercises Ankle Circles/Pumps: AROM, 10 reps Quad Sets: AROM, 10 reps Gluteal Sets: AROM, 10 reps Towel Squeeze: AROM, 10 reps        Pertinent Vitals/Pain Pain Assessment Pain Assessment: 0-10 Pain Score: 7  Pain Location: R hip Pain Descriptors / Indicators: Aching, Discomfort Pain Intervention(s): Limited activity within patient's tolerance, Monitored during session, Premedicated before session, Repositioned     PT Goals (current goals can now be found in the care plan section) Acute Rehab PT Goals Patient Stated Goal: to return home Progress towards PT goals: Progressing toward goals    Frequency    BID      PT Plan Current plan remains appropriate       AM-PAC PT "6 Clicks" Mobility   Outcome Measure  Help needed turning from  your back to your side while in a flat bed without using bedrails?: A Little Help needed moving from lying on your  back to sitting on the side of a flat bed without using bedrails?: A Little Help needed moving to and from a bed to a chair (including a wheelchair)?: A Little Help needed standing up from a chair using your arms (e.g., wheelchair or bedside chair)?: A Little Help needed to walk in hospital room?: A Little Help needed climbing 3-5 steps with a railing? : A Little 6 Click Score: 18    End of Session   Activity Tolerance: Patient tolerated treatment well Patient left: in chair;with call bell/phone within reach;with chair alarm set Nurse Communication: Mobility status PT Visit Diagnosis: Muscle weakness (generalized) (M62.81);Other abnormalities of gait and mobility (R26.89);Pain Pain - Right/Left: Right Pain - part of body: Hip     Time: 0812-0840 PT Time Calculation (min) (ACUTE ONLY): 28 min  Charges:  $Gait Training: 8-22 mins $Therapeutic Activity: 8-22 mins                     Julaine Fusi PTA 03/24/22, 12:26 PM

## 2022-03-24 NOTE — TOC Progression Note (Signed)
Transition of Care [facility]) - Progression Note    Patient Details  Name: Dana Bautista MRN: [id number] Date of Birth: 1945/12/23  Transition of Care [facility]) CM/SW [place], RN Phone Number: 03/24/2022, 11:24 AM  Clinical Narrative:    Contacted [employer] to get the RW and the 3 in 1 delivered to the bedside, DC pending delivery   Expected Discharge Plan: [place] Barriers to Discharge: Barriers Resolved  Expected Discharge Plan and Services Expected Discharge Plan: [place]   Discharge Planning Services: CM Consult   Living arrangements for the past 2 months: Single Family Home Expected Discharge Date: 03/24/22               DME Arranged: [place]ford Rile rolling, 3-N-1 DME Agency: [employer] Date DME Agency Contacted: 03/23/22 Time DME Agency Contacted: [phone]   [place]: [place] Date HH Agency Contacted: 03/23/22 Time [place]: [phone] Representative spoke with at [place]: [place]   Social Determinants of Health ([place]) Interventions    Readmission Risk Interventions     No data to display

## 2022-03-24 NOTE — Discharge Summary (Signed)
Physician Discharge Summary  Patient ID: Dana Bautista MRN: [id number] DOB/AGE: [birth year] 76 y.o.  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  Discharge Diagnoses: Patient Active Problem List   Diagnosis Date Noted   Status post total hip replacement, 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   Cellulitis 06/04/2021   Cellulitis of right hand 06/03/2021   Dog bite of right hand 06/03/2021   Normocytic anemia 06/03/2021   Flexor tenosynovitis of finger    History of deep vein thrombosis (DVT) of lower extremity 10/20/2020   History of pulmonary embolism 10/20/2020   Lower leg DVT (deep venous thromboembolism), acute, right ([place]) 10/05/2019   Multiple subsegmental pulmonary emboli without acute cor pulmonale (HCC) 10/04/2019   Pericardial effusion 10/04/2019   Pleural effusion 10/04/2019   Vitamin B 12 deficiency 08/10/2017   Arm pain, left 04/25/2017   Anemia due to vitamin B12 deficiency 04/13/2017   Chronic constipation 02/25/2017   Intermittent chest pain 11/20/2016   Nausea 11/20/2016   Right ear impacted cerumen 11/20/2016   Carotid stenosis, asymptomatic, bilateral 10/06/2016   Dizziness 06/13/2016   H/O mitral valve prolapse 06/13/2016   Neck muscle strain 06/13/2016   Temporal headache 06/13/2016   Vitamin D deficiency, unspecified 12/21/2015   Chronic right-sided low back pain without sciatica 12/16/2015   Meralgia paresthetica of right side 12/16/2015   Prediabetes 12/16/2015   Urge urinary incontinence 12/16/2015    Past Medical History:  Diagnosis Date   Anemia    Arthritis    osteoarthriist   Cancer ([place])    skin   Carotid stenosis, asymptomatic, bilateral    Cellulitis    Complication of anesthesia    nausea   DVT (deep venous thrombosis) ([place]) 09/2019   Heart murmur    History of methicillin resistant staphylococcus  aureus (MRSA) 2020   Mitral valve regurgitation    Pericardial effusion    Pleural effusion    PONV (postoperative nausea and vomiting)    Pre-diabetes    Pulmonary embolism ([place]) 2020   Sleep apnea    uses cpap   Tendonitis    outer aspect of right foot   Thyroid nodule    UTI (lower urinary tract infection)    Vitamin B 12 deficiency      Transfusion: None.   Consultants (if any):   Discharged Condition: Improved  Hospital Course: [name] is an 76 y.o. female who was admitted 03/22/2022 with a diagnosis of degenerative joint disease of the right hip secondary to avascular necrosis of the right hip and went to the operating room on 03/22/2022 and underwent the above named procedures.    Surgeries: Procedure(s): TOTAL HIP ARTHROPLASTY on 03/22/2022 Patient tolerated the surgery well. Taken to PACU where she was stabilized and then transferred to the orthopedic floor.  Continued on Eliquis 2.'5mg'$  twice daily. Foot pumps applied bilaterally at 80 mm. Heels elevated on bed with rolled towels. No evidence of DVT. Negative [place]. Physical therapy started on day #1 for gait training and transfer. OT started day #1 for ADL and assisted devices.  Patient's IV was removed on POD2.  Woundvac was removed on POD2.  Implants: Biomet press-fit system with a #12 laterally offset Echo femoral stem, a 52 mm acetabular shell with an E-poly hi-wall liner, and a 36 mm ceramic head with a +3 mm neck.    She was given perioperative antibiotics:  Anti-infectives (From admission,  onward)    Start     Dose/Rate Route Frequency Ordered Stop   03/22/22 1400  ceFAZolin (ANCEF) IVPB 2g/100 mL premix        2 g 200 mL/hr over 30 Minutes Intravenous Every 6 hours 03/22/22 1403 03/23/22 0445   03/22/22 0609  ceFAZolin (ANCEF) 2-4 GM/100ML-% IVPB       Note to Pharmacy: Rutherford Nail E: cabinet override      03/22/22 0609 03/22/22 0815   03/22/22 0600  ceFAZolin (ANCEF) IVPB 2g/100 mL premix        2  g 200 mL/hr over 30 Minutes Intravenous On call to O.R. 03/22/22 0018 03/22/22 2778     .  She was given sequential compression devices, early ambulation, and Eliquis for DVT prophylaxis.  She benefited maximally from the hospital stay and there were no complications.    Recent vital signs:  Vitals:   03/24/22 0513 03/24/22 0748  BP: (!) 116/54 107/87  Pulse: 91 90  Resp: 17 16  Temp: 98.5 F (36.9 C) 99.1 F (37.3 C)  SpO2: 94% 97%    Recent laboratory studies:  Lab Results  Component Value Date   HGB 8.7 (L) 03/24/2022   HGB 8.3 (L) 03/23/2022   HGB 11.4 (L) 03/11/2022   Lab Results  Component Value Date   WBC 9.7 03/24/2022   PLT 196 03/24/2022   Lab Results  Component Value Date   INR 1.1 10/05/2019   Lab Results  Component Value Date   NA 132 (L) 03/23/2022   K 3.9 03/23/2022   CL 103 03/23/2022   CO2 24 03/23/2022   BUN 6 (L) 03/23/2022   CREATININE 0.43 (L) 03/23/2022   GLUCOSE 113 (H) 03/23/2022    Discharge Medications:   Allergies as of 03/24/2022       Reactions   Codeine Nausea And Vomiting   Elemental Sulfur Other (See Comments)   Lip swelling, difficulty breathing   Gabapentin Nausea And Vomiting   Levaquin [levofloxacin] Hives   Penicillins Hives, Other (See Comments)   Did it involve swelling of the face/tongue/throat, SOB, or low BP? Unknown Did it involve sudden or severe rash/hives, skin peeling, or any reaction on the inside of your mouth or nose? Yes Did you need to seek medical attention at a hospital or doctor's office? Yes When did it last happen? many years If all above answers are "NO", may proceed with cephalosporin use.   Sulfa Antibiotics Other (See Comments)   Other reaction(s): lip swelling   Latex Rash   Peeling skin        Medication List     STOP taking these medications    nitrofurantoin 100 MG capsule Commonly known as: MACRODANTIN   traMADol 50 MG tablet Commonly known as: ULTRAM       TAKE these  medications    acetaminophen 500 MG tablet Commonly known as: TYLENOL Take 500-1,000 mg by mouth every 6 (six) hours as needed for mild pain or moderate pain.   apixaban 2.5 MG Tabs tablet Commonly known as: Eliquis Take 1 tablet (2.5 mg total) by mouth 2 (two) times daily.   Calcium Citrate Powd Take 5 mLs by mouth daily.   cyanocobalamin 1000 MCG/ML injection Commonly known as: (VITAMIN B-12) Inject 1,000 mcg into the muscle every 30 (thirty) days.   D-3-5 125 MCG (5000 UT) capsule Generic drug: Cholecalciferol Take 5,000 Units by mouth daily.   docusate sodium 100 MG capsule Commonly known as: COLACE Take  100 mg by mouth daily.   ferrous sulfate 325 (65 FE) MG tablet Take 325 mg by mouth daily with breakfast.   HYDROmorphone 2 MG tablet Commonly known as: DILAUDID Take 0.5-1 tablets (1-2 mg total) by mouth every 6 (six) hours as needed for moderate pain.   multivitamin with minerals tablet Take 2 tablets by mouth daily. Gummy   ondansetron 4 MG tablet Commonly known as: ZOFRAN Take 1 tablet (4 mg total) by mouth every 6 (six) hours as needed for nausea. What changed: Another medication with the same name was added. Make sure you understand how and when to take each.   ondansetron 4 MG tablet Commonly known as: ZOFRAN Take 1-2 tablets (4-8 mg total) by mouth every 6 (six) hours as needed for nausea. What changed: You were already taking a medication with the same name, and this prescription was added. Make sure you understand how and when to take each.   pantoprazole 40 MG tablet Commonly known as: PROTONIX Take 1 tablet (40 mg total) by mouth daily.   solifenacin 5 MG tablet Commonly known as: VESICARE Take 5 mg by mouth 2 (two) times daily.               Durable Medical Equipment  (From admission, onward)           Start     Ordered   03/22/22 1403  DME Bedside commode  Once       Question:  Patient needs a bedside commode to treat with the  following condition  Answer:  Status post total hip replacement, right   03/22/22 1402   03/22/22 1403  DME 3 n 1  Once        03/22/22 1402   03/22/22 1403  DME Walker rolling  Once       Question Answer Comment  Walker: With 5 Inch Wheels   Patient needs a walker to treat with the following condition Status post total hip replacement, right      03/22/22 1402            Diagnostic Studies: DG HIP UNILAT W OR W/O PELVIS 2-3 VIEWS RIGHT  Result Date: 03/22/2022 CLINICAL DATA:  Status post total hip replacement. EXAM: DG HIP (WITH OR WITHOUT PELVIS) 2-3V RIGHT COMPARISON:  None Available. FINDINGS: Patient has had RIGHT total hip arthroplasty. Soft tissue gas is identified in the region of the hip. Hardware appears well seated. No acute fractures. IMPRESSION: RIGHT total hip arthroplasty.  No adverse features. Electronically Signed   By: Nolon Nations M.D.   On: 03/22/2022 10:43   PERIPHERAL VASCULAR CATHETERIZATION  Result Date: 03/15/2022 See surgical note for result.   Disposition:  There are no questions and answers to display.        Plan for discharge home today pending progress with PT today.   Follow-up Information     Lattie Corns, PA-C Follow up.   Specialty: Physician Assistant Why: Levert Feinstein removal. Contact information: [place] [address] [phone]                Signed: Judson Roch PA-C 03/24/2022, 10:43 AM

## 2022-03-24 NOTE — Discharge Instructions (Signed)
Instructions after Total Hip Replacement     J. Jeffrey Poggi, M.D.  J. Lance Dai Mcadams, PA-C     Dept. of Orthopaedics & Sports Medicine  [facility]  [address]  [place], [place]  [address]  Phone: [phone]   Fax: [phone]    DIET: . Drink plenty of non-alcoholic fluids. . Resume your normal diet. Include foods high in fiber.  ACTIVITY:  . You may use crutches or a walker with weight-bearing as tolerated, unless instructed otherwise. . You may be weaned off of the walker or crutches by your Physical Therapist.  . Do NOT reach below the level of your knees or cross your legs until allowed.    . Continue doing gentle exercises. Exercising will reduce the pain and swelling, increase motion, and prevent muscle weakness.   . Please continue to use the TED compression stockings for 6 weeks. You may remove the stockings at night, but should reapply them in the morning. . Do not drive or operate any equipment until instructed.  WOUND CARE:  . Continue to use ice packs periodically to reduce pain and swelling. . Keep the incision clean and dry. . You may bathe or shower after the staples are removed at the first office visit following surgery.  MEDICATIONS: . You may resume your regular medications. . Please take the pain medication as prescribed on the medication. . Do not take pain medication on an empty stomach. . You have been given a prescription for a blood thinner to prevent blood clots. Please take the medication as instructed. (NOTE: After completing a 2 week course of Lovenox, take one Enteric-coated aspirin once a day.) . Pain medications and iron supplements can cause constipation. Use a stool softener (Senokot or Colace) on a daily basis and a laxative (dulcolax or miralax) as needed. . Do not drive or drink alcoholic beverages when taking pain medications.  CALL THE OFFICE FOR: . Temperature above 101 degrees . Excessive bleeding or drainage on the  dressing. . Excessive swelling, coldness, or paleness of the toes. . Persistent nausea and vomiting.  FOLLOW-UP:  . You should have an appointment to return to the office in 2 weeks after surgery. . Arrangements have been made for continuation of Physical Therapy (either home therapy or outpatient therapy).  

## 2022-03-24 NOTE — Progress Notes (Addendum)
Subjective: 2 Days Post-Op Procedure(s) (LRB): TOTAL HIP ARTHROPLASTY (Right) Patient reports pain as moderate but does feel that her pain has improved. Patient is well, her nausea and vomiting have improved since changing her pain medication. Plan is to go Home after hospital stay.  PT and care management to assist with discharge planning. Negative for chest pain and shortness of breath Fever: no Gastrointestinal:Negative  for vomiting this morning Patient is urinating well, she is passing gas without pain.  Objective: Vital signs in last 24 hours: Temp:  [98.5 F (36.9 C)-99.4 F (37.4 C)] 99.1 F (37.3 C) (06/29 0748) Pulse Rate:  [88-92] 90 (06/29 0748) Resp:  [16-17] 16 (06/29 0748) BP: (107-116)/(44-87) 107/87 (06/29 0748) SpO2:  [94 %-99 %] 97 % (06/29 0748)  Intake/Output from previous day:  Intake/Output Summary (Last 24 hours) at 03/24/2022 1010 Last data filed at 03/23/2022 1939 Gross per 24 hour  Intake 784.78 ml  Output 300 ml  Net 484.78 ml    Intake/Output this shift: No intake/output data recorded.  Labs: Recent Labs    03/23/22 0347 03/24/22 0348  HGB 8.3* 8.7*   Recent Labs    03/23/22 0347 03/24/22 0348  WBC 6.2 9.7  RBC 2.94* 3.08*  HCT 25.5* 26.5*  PLT 180 196   Recent Labs    03/23/22 0347  NA 132*  K 3.9  CL 103  CO2 24  BUN 6*  CREATININE 0.43*  GLUCOSE 113*  CALCIUM 7.8*   No results for input(s): "LABPT", "INR" in the last 72 hours.  EXAM General - Patient is Alert, Appropriate, and Oriented Extremity - ABD soft Neurovascular intact Dorsiflexion/Plantar flexion intact Incision: Prevena woundvac intact without leak No cellulitis present Dressing/Incision - Woundvac intact without any drainage noted. Motor Function - intact, moving foot and toes well on exam.  Abdomen soft with intact bowel sounds.  Past Medical History:  Diagnosis Date   Anemia    Arthritis    osteoarthriist   Cancer ([place])    skin   Carotid  stenosis, asymptomatic, bilateral    Cellulitis    Complication of anesthesia    nausea   DVT (deep venous thrombosis) ([place]) 09/2019   Heart murmur    History of methicillin resistant staphylococcus aureus (MRSA) 2020   Mitral valve regurgitation    Pericardial effusion    Pleural effusion    PONV (postoperative nausea and vomiting)    Pre-diabetes    Pulmonary embolism ([place]) 2020   Sleep apnea    uses cpap   Tendonitis    outer aspect of right foot   Thyroid nodule    UTI (lower urinary tract infection)    Vitamin B 12 deficiency    Assessment/Plan: 2 Days Post-Op Procedure(s) (LRB): TOTAL HIP ARTHROPLASTY (Right) Principal Problem:   Status post total hip replacement, right  Estimated body mass index is 30.99 kg/m as calculated from the following:   Height as of this encounter: '5\' 6"'$  (1.676 m).   Weight as of this encounter: 87.1 kg. Advance diet Up with therapy D/C IV fluids when tolerating po intake.  Labs reviewed, WBC 9.7 this morning. N/V have improved today, continue with dilaudid as needed for pain. Up with therapy today.  Plan for discharge home today pending progress with PT.  Addendum 1:07 PM Prevena was removed this afternoon, no drainage noted.  Honeycomb dressing applied. Patient performed well with PT.  Plan for discharge home today.  DVT Prophylaxis - TED hose and Eliquis, SCDs Weight-Bearing as  tolerated to right leg  J. Cameron Proud, PA-C [facility] Orthopaedic Surgery 03/24/2022, 10:10 AM

## 2022-03-24 NOTE — Plan of Care (Signed)
  Problem: Education: Goal: Knowledge of General Education information will improve Description: Including pain rating scale, medication(s)/side effects and non-pharmacologic comfort measures Outcome: Progressing   Problem: Health Behavior/Discharge Planning: Goal: Ability to manage health-related needs will improve Outcome: Progressing   Problem: Clinical Measurements: Goal: Will remain free from infection Outcome: Progressing   Problem: Nutrition: Goal: Adequate nutrition will be maintained Outcome: Progressing   Problem: Coping: Goal: Level of anxiety will decrease Outcome: Progressing   Problem: Pain Managment: Goal: General experience of comfort will improve Outcome: Progressing   Problem: Safety: Goal: Ability to remain free from injury will improve Outcome: Progressing

## 2022-03-24 NOTE — Progress Notes (Cosign Needed)
Patient is not able to walk the distance required to go the bathroom, or he/she is unable to safely negotiate stairs required to access the bathroom.  A 3in1 BSC will alleviate this problem  

## 2022-03-24 NOTE — Progress Notes (Signed)
Patient was given verbal and written discharge instructions, she acknowledge understanding  and states she will comply, equipment has arrived for patient, waiting for ride to pick up

## 2022-05-05 NOTE — Telephone Encounter (Signed)
Yes, let's get her in for a dvt study with an office visit with myself of [facility]

## 2022-05-10 ENCOUNTER — Other Ambulatory Visit (INDEPENDENT_AMBULATORY_CARE_PROVIDER_SITE_OTHER): Payer: Self-pay | Admitting: Nurse Practitioner

## 2022-05-10 DIAGNOSIS — Z86718 Personal history of other venous thrombosis and embolism: Secondary | ICD-10-CM

## 2022-05-10 DIAGNOSIS — Z95828 Presence of other vascular implants and grafts: Secondary | ICD-10-CM

## 2022-05-11 ENCOUNTER — Ambulatory Visit (INDEPENDENT_AMBULATORY_CARE_PROVIDER_SITE_OTHER): Payer: Medicare Other

## 2022-05-11 DIAGNOSIS — Z86718 Personal history of other venous thrombosis and embolism: Secondary | ICD-10-CM | POA: Diagnosis not present

## 2022-05-11 DIAGNOSIS — Z95828 Presence of other vascular implants and grafts: Secondary | ICD-10-CM | POA: Diagnosis not present

## 2022-05-16 ENCOUNTER — Ambulatory Visit (INDEPENDENT_AMBULATORY_CARE_PROVIDER_SITE_OTHER): Payer: Medicare Other | Admitting: Nurse Practitioner

## 2022-05-16 ENCOUNTER — Telehealth (INDEPENDENT_AMBULATORY_CARE_PROVIDER_SITE_OTHER): Payer: Self-pay

## 2022-05-16 ENCOUNTER — Encounter (INDEPENDENT_AMBULATORY_CARE_PROVIDER_SITE_OTHER): Payer: Self-pay | Admitting: Nurse Practitioner

## 2022-05-16 VITALS — BP 101/66 | HR 69 | Resp 17

## 2022-05-16 DIAGNOSIS — I83893 Varicose veins of bilateral lower extremities with other complications: Secondary | ICD-10-CM

## 2022-05-16 DIAGNOSIS — Z95828 Presence of other vascular implants and grafts: Secondary | ICD-10-CM

## 2022-05-16 NOTE — Telephone Encounter (Signed)
Spoke with the patient and she is scheduled for a IVC filter removal on 05/31/22 with Dr. Delana Meyer and a 6:45 am arrival time to the MM. Pre-procedure instructions were discussed and will be mailed.

## 2022-05-30 ENCOUNTER — Encounter (INDEPENDENT_AMBULATORY_CARE_PROVIDER_SITE_OTHER): Payer: Self-pay | Admitting: Nurse Practitioner

## 2022-05-30 NOTE — Progress Notes (Signed)
Subjective:    Patient ID: Dana Bautista, female    DOB: 1945-10-01, 75 y.o.   MRN: [id number] No chief complaint on file.   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  DVT/PE was identified years ago and was treated with anticoagulation.  Her initial presenting symptoms were left chest pain.  This has largely resolved as indicated by previous notes.   Currently the patient does not exhibit signs and symptoms of DVT/PE such as cough or hemoptysis.  She is still currently maintained on Eliquis 5 mg twice daily.  Underwent her total hip replacement on 03/22/2022.  She has been doing well with physical therapy and rehab.  Today she had noninvasive studies and limited no evidence of DVT today.      Review of Systems  All other systems reviewed and are negative.      Objective:   Physical Exam Vitals reviewed.  HENT:     Head: Normocephalic.  Cardiovascular:     Rate and Rhythm: Normal rate.  Pulmonary:     Effort: Pulmonary effort is normal.  Skin:    General: Skin is warm and dry.  Neurological:     Mental Status: She is alert and oriented to person, place, and time.  Psychiatric:        Mood and Affect: Mood normal.        Behavior: Behavior normal.        Thought Content: Thought content normal.        Judgment: Judgment normal.     BP 101/66 (BP Location: Left Arm)   Pulse 69   Resp 17   Past Medical History:  Diagnosis Date   Anemia    Arthritis    osteoarthriist   Cancer ([facility])    skin   Carotid stenosis, asymptomatic, bilateral    Cellulitis    Complication of anesthesia    nausea   DVT (deep venous thrombosis) ([place]) 09/2019   Heart murmur    History of methicillin resistant staphylococcus aureus (MRSA) 2020   Mitral valve regurgitation    Pericardial effusion    Pleural effusion    PONV (postoperative nausea and vomiting)    Pre-diabetes     Pulmonary embolism ([facility]) 2020   Sleep apnea    uses cpap   Tendonitis    outer aspect of right foot   Thyroid nodule    UTI (lower urinary tract infection)    Vitamin B 12 deficiency     Social History   Socioeconomic History   Marital status: Married    Spouse name: Not on file   Number of children: Not on file   Years of education: Not on file   Highest education level: Not on file  Occupational History   Not on file  Tobacco Use   Smoking status: Never   Smokeless tobacco: Never  Vaping Use   Vaping Use: Never used  Substance and Sexual Activity   Alcohol use: No   Drug use: No   Sexual activity: Not on file  Other Topics Concern   Not on file  Social History Narrative   Not on file   Social Determinants of Health   Financial Resource Strain: Not on file  Food Insecurity: Not on file  Transportation Needs: Not on file  Physical Activity: Not on file  Stress: Not on file  Social Connections: Not on file  Intimate  Partner Violence: Not on file    Past Surgical History:  Procedure Laterality Date   ABDOMINAL HYSTERECTOMY     APPENDECTOMY     COLONOSCOPY     FRACTURE SURGERY     left wrist   FRACTURE SURGERY Left    wrist   INCISION AND DRAINAGE Right 06/03/2021   Procedure: INCISION AND DRAINAGE-Right Index Finger;  Surgeon: Corky Mull, MD;  Location: [facility];  Service: Orthopedics;  Laterality: Right;   IVC FILTER INSERTION N/A 03/15/2022   Procedure: IVC FILTER INSERTION;  Surgeon: Katha Cabal, MD;  Location: [facility];  Service: Cardiovascular;  Laterality: N/A;   KNEE SURGERY Left    debridement   TONSILLECTOMY     TOTAL HIP ARTHROPLASTY Right 03/22/2022   Procedure: TOTAL HIP ARTHROPLASTY;  Surgeon: Corky Mull, MD;  Location: [facility];  Service: Orthopedics;  Laterality: Right;    Family History  Problem Relation Age of Onset   Breast cancer Mother    Asthma Mother    Lung cancer Father    Heart disease Brother     Heart disease Maternal Grandfather    Throat cancer Maternal Grandfather     Allergies  Allergen Reactions   Codeine Nausea And Vomiting   Elemental Sulfur Other (See Comments)    Lip swelling, difficulty breathing   Gabapentin Nausea And Vomiting   Levaquin [Levofloxacin] Hives   Penicillins Hives and Other (See Comments)    Did it involve swelling of the face/tongue/throat, SOB, or low BP? Unknown Did it involve sudden or severe rash/hives, skin peeling, or any reaction on the inside of your mouth or nose? Yes Did you need to seek medical attention at a hospital or doctor's office? Yes When did it last happen? many years If all above answers are "NO", may proceed with cephalosporin use.    Sulfa Antibiotics Other (See Comments)    Other reaction(s): lip swelling   Latex Rash    Peeling skin       Latest Ref Rng & Units 03/24/2022    3:48 AM 03/23/2022    3:47 AM 03/11/2022   12:13 PM  CBC  WBC 4.0 - 10.5 K/uL 9.7  6.2  7.0   Hemoglobin 12.0 - 15.0 g/dL 8.7  8.3  11.4   Hematocrit 36.0 - 46.0 % 26.5  25.5  35.2   Platelets 150 - 400 K/uL 196  180  271       CMP     Component Value Date/Time   NA 132 (L) 03/23/2022 0347   K 3.9 03/23/2022 0347   CL 103 03/23/2022 0347   CO2 24 03/23/2022 0347   GLUCOSE 113 (H) 03/23/2022 0347   BUN 6 (L) 03/23/2022 0347   CREATININE 0.43 (L) 03/23/2022 0347   CALCIUM 7.8 (L) 03/23/2022 0347   PROT 7.0 12/13/2021 1004   ALBUMIN 3.9 12/13/2021 1004   AST 14 (L) 12/13/2021 1004   ALT 10 12/13/2021 1004   ALKPHOS 56 12/13/2021 1004   BILITOT 0.7 12/13/2021 1004   GFRNONAA >60 03/23/2022 0347   GFRAA >60 05/01/2020 1102     No results found.     Assessment & Plan:   1. Presence of IVC filter The patient has done well following her orthopedic surgery.  Based on this, we will have the patient undergo IVC filter removal.  We discussed the risk benefits and alternatives.  The patient agrees to proceed.  2. Varicose veins of  bilateral lower  extremities with other complications The patient does have some notable varicosities bilaterally.  She has previously had treatment on these and outside facility.  We will have her return at her convenience for reflux studies to determine if there is new evidence of venous reflux for possible treatment options.   Current Outpatient Medications on File Prior to Visit  Medication Sig Dispense Refill   acetaminophen (TYLENOL) 500 MG tablet Take 500-1,000 mg by mouth every 6 (six) hours as needed for mild pain or moderate pain.     apixaban (ELIQUIS) 5 MG TABS tablet Take by mouth.     Calcium Citrate POWD Take 5 mLs by mouth daily.     Cholecalciferol (D-3-5) 125 MCG (5000 UT) capsule Take 5,000 Units by mouth daily.     cyanocobalamin (,VITAMIN B-12,) 1000 MCG/ML injection Inject 1,000 mcg into the muscle every 30 (thirty) days.     docusate sodium (COLACE) 100 MG capsule Take 100 mg by mouth daily.     ferrous sulfate 325 (65 FE) MG tablet Take 325 mg by mouth daily with breakfast.     Multiple Vitamins-Minerals (MULTIVITAMIN WITH MINERALS) tablet Take 2 tablets by mouth daily. Gummy     No current facility-administered medications on file prior to visit.    There are no Patient Instructions on file for this visit. No follow-ups on file.   Kris Hartmann, NP

## 2022-05-30 NOTE — H&P (View-Only) (Signed)
Subjective:    Patient ID: Dana Bautista, female    DOB: 03-08-1946, 76 y.o.   MRN: [id number] No chief complaint on file.   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  DVT/PE was identified years ago and was treated with anticoagulation.  Her initial presenting symptoms were left chest pain.  This has largely resolved as indicated by previous notes.   Currently the patient does not exhibit signs and symptoms of DVT/PE such as cough or hemoptysis.  She is still currently maintained on Eliquis 5 mg twice daily.  Underwent her total hip replacement on 03/22/2022.  She has been doing well with physical therapy and rehab.  Today she had noninvasive studies and limited no evidence of DVT today.      Review of Systems  All other systems reviewed and are negative.      Objective:   Physical Exam Vitals reviewed.  HENT:     Head: Normocephalic.  Cardiovascular:     Rate and Rhythm: Normal rate.  Pulmonary:     Effort: Pulmonary effort is normal.  Skin:    General: Skin is warm and dry.  Neurological:     Mental Status: She is alert and oriented to person, place, and time.  Psychiatric:        Mood and Affect: Mood normal.        Behavior: Behavior normal.        Thought Content: Thought content normal.        Judgment: Judgment normal.     BP 101/66 (BP Location: Left Arm)   Pulse 69   Resp 17   Past Medical History:  Diagnosis Date   Anemia    Arthritis    osteoarthriist   Cancer (HCC)    skin   Carotid stenosis, asymptomatic, bilateral    Cellulitis    Complication of anesthesia    nausea   DVT (deep venous thrombosis) ([place]) 09/2019   Heart murmur    History of methicillin resistant staphylococcus aureus (MRSA) 2020   Mitral valve regurgitation    Pericardial effusion    Pleural effusion    PONV (postoperative nausea and vomiting)    Pre-diabetes     Pulmonary embolism (HCC) 2020   Sleep apnea    uses cpap   Tendonitis    outer aspect of right foot   Thyroid nodule    UTI (lower urinary tract infection)    Vitamin B 12 deficiency     Social History   Socioeconomic History   Marital status: Married    Spouse name: Not on file   Number of children: Not on file   Years of education: Not on file   Highest education level: Not on file  Occupational History   Not on file  Tobacco Use   Smoking status: Never   Smokeless tobacco: Never  Vaping Use   Vaping Use: Never used  Substance and Sexual Activity   Alcohol use: No   Drug use: No   Sexual activity: Not on file  Other Topics Concern   Not on file  Social History Narrative   Not on file   Social Determinants of Health   Financial Resource Strain: Not on file  Food Insecurity: Not on file  Transportation Needs: Not on file  Physical Activity: Not on file  Stress: Not on file  Social Connections: Not on file  Intimate  Partner Violence: Not on file    Past Surgical History:  Procedure Laterality Date   ABDOMINAL HYSTERECTOMY     APPENDECTOMY     COLONOSCOPY     FRACTURE SURGERY     left wrist   FRACTURE SURGERY Left    wrist   INCISION AND DRAINAGE Right 06/03/2021   Procedure: INCISION AND DRAINAGE-Right Index Finger;  Surgeon: Corky Mull, MD;  Location: [facility];  Service: Orthopedics;  Laterality: Right;   IVC FILTER INSERTION N/A 03/15/2022   Procedure: IVC FILTER INSERTION;  Surgeon: Katha Cabal, MD;  Location: [facility];  Service: Cardiovascular;  Laterality: N/A;   KNEE SURGERY Left    debridement   TONSILLECTOMY     TOTAL HIP ARTHROPLASTY Right 03/22/2022   Procedure: TOTAL HIP ARTHROPLASTY;  Surgeon: Corky Mull, MD;  Location: [facility];  Service: Orthopedics;  Laterality: Right;    Family History  Problem Relation Age of Onset   Breast cancer Mother    Asthma Mother    Lung cancer Father    Heart disease Brother     Heart disease Maternal Grandfather    Throat cancer Maternal Grandfather     Allergies  Allergen Reactions   Codeine Nausea And Vomiting   Elemental Sulfur Other (See Comments)    Lip swelling, difficulty breathing   Gabapentin Nausea And Vomiting   Levaquin [Levofloxacin] Hives   Penicillins Hives and Other (See Comments)    Did it involve swelling of the face/tongue/throat, SOB, or low BP? Unknown Did it involve sudden or severe rash/hives, skin peeling, or any reaction on the inside of your mouth or nose? Yes Did you need to seek medical attention at a hospital or doctor's office? Yes When did it last happen? many years If all above answers are "NO", may proceed with cephalosporin use.    Sulfa Antibiotics Other (See Comments)    Other reaction(s): lip swelling   Latex Rash    Peeling skin       Latest Ref Rng & Units 03/24/2022    3:48 AM 03/23/2022    3:47 AM 03/11/2022   12:13 PM  CBC  WBC 4.0 - 10.5 K/uL 9.7  6.2  7.0   Hemoglobin 12.0 - 15.0 g/dL 8.7  8.3  11.4   Hematocrit 36.0 - 46.0 % 26.5  25.5  35.2   Platelets 150 - 400 K/uL 196  180  271       CMP     Component Value Date/Time   NA 132 (L) 03/23/2022 0347   K 3.9 03/23/2022 0347   CL 103 03/23/2022 0347   CO2 24 03/23/2022 0347   GLUCOSE 113 (H) 03/23/2022 0347   BUN 6 (L) 03/23/2022 0347   CREATININE 0.43 (L) 03/23/2022 0347   CALCIUM 7.8 (L) 03/23/2022 0347   PROT 7.0 12/13/2021 1004   ALBUMIN 3.9 12/13/2021 1004   AST 14 (L) 12/13/2021 1004   ALT 10 12/13/2021 1004   ALKPHOS 56 12/13/2021 1004   BILITOT 0.7 12/13/2021 1004   GFRNONAA >60 03/23/2022 0347   GFRAA >60 05/01/2020 1102     No results found.     Assessment & Plan:   1. Presence of IVC filter The patient has done well following her orthopedic surgery.  Based on this, we will have the patient undergo IVC filter removal.  We discussed the risk benefits and alternatives.  The patient agrees to proceed.  2. Varicose veins of  bilateral lower  extremities with other complications The patient does have some notable varicosities bilaterally.  She has previously had treatment on these and outside facility.  We will have her return at her convenience for reflux studies to determine if there is new evidence of venous reflux for possible treatment options.   Current Outpatient Medications on File Prior to Visit  Medication Sig Dispense Refill   acetaminophen (TYLENOL) 500 MG tablet Take 500-1,000 mg by mouth every 6 (six) hours as needed for mild pain or moderate pain.     apixaban (ELIQUIS) 5 MG TABS tablet Take by mouth.     Calcium Citrate POWD Take 5 mLs by mouth daily.     Cholecalciferol (D-3-5) 125 MCG (5000 UT) capsule Take 5,000 Units by mouth daily.     cyanocobalamin (,VITAMIN B-12,) 1000 MCG/ML injection Inject 1,000 mcg into the muscle every 30 (thirty) days.     docusate sodium (COLACE) 100 MG capsule Take 100 mg by mouth daily.     ferrous sulfate 325 (65 FE) MG tablet Take 325 mg by mouth daily with breakfast.     Multiple Vitamins-Minerals (MULTIVITAMIN WITH MINERALS) tablet Take 2 tablets by mouth daily. Gummy     No current facility-administered medications on file prior to visit.    There are no Patient Instructions on file for this visit. No follow-ups on file.   Kris Hartmann, NP

## 2022-05-31 ENCOUNTER — Other Ambulatory Visit: Payer: Self-pay

## 2022-05-31 ENCOUNTER — Ambulatory Visit
Admission: RE | Admit: 2022-05-31 | Discharge: 2022-05-31 | Disposition: A | Discharge status: Home / Self Care | Payer: Medicare Other | Source: Ambulatory Visit | Attending: Vascular Surgery | Admitting: Vascular Surgery

## 2022-05-31 ENCOUNTER — Encounter
Admission: RE | Disposition: A | Discharge status: Home / Self Care | Payer: Self-pay | Source: Ambulatory Visit | Attending: Vascular Surgery

## 2022-05-31 ENCOUNTER — Encounter: Payer: Self-pay | Admitting: Vascular Surgery

## 2022-05-31 DIAGNOSIS — Z86718 Personal history of other venous thrombosis and embolism: Secondary | ICD-10-CM | POA: Diagnosis not present

## 2022-05-31 DIAGNOSIS — Z9889 Other specified postprocedural states: Secondary | ICD-10-CM | POA: Diagnosis not present

## 2022-05-31 DIAGNOSIS — Z7901 Long term (current) use of anticoagulants: Secondary | ICD-10-CM | POA: Diagnosis not present

## 2022-05-31 DIAGNOSIS — I82409 Acute embolism and thrombosis of unspecified deep veins of unspecified lower extremity: Secondary | ICD-10-CM

## 2022-05-31 DIAGNOSIS — Z4589 Encounter for adjustment and management of other implanted devices: Secondary | ICD-10-CM | POA: Insufficient documentation

## 2022-05-31 DIAGNOSIS — I8290 Acute embolism and thrombosis of unspecified vein: Secondary | ICD-10-CM

## 2022-05-31 HISTORY — PX: IVC FILTER REMOVAL: CATH118246

## 2022-05-31 SURGERY — IVC FILTER REMOVAL
Anesthesia: Moderate Sedation

## 2022-05-31 MED ORDER — VANCOMYCIN HCL IN DEXTROSE 1-5 GM/200ML-% IV SOLN
1000.0000 mg | INTRAVENOUS | Status: AC
  Administered 2022-05-31: 1000 mg via INTRAVENOUS
  Filled 2022-05-31: qty 200

## 2022-05-31 MED ORDER — HYDROMORPHONE HCL 1 MG/ML IJ SOLN: 1.0000 mg | Freq: Once | INTRAMUSCULAR | Status: DC | PRN

## 2022-05-31 MED ORDER — DIPHENHYDRAMINE HCL 50 MG/ML IJ SOLN
INTRAMUSCULAR | Status: DC | PRN
  Administered 2022-05-31: 25 mg via INTRAVENOUS

## 2022-05-31 MED ORDER — FENTANYL CITRATE PF 50 MCG/ML IJ SOSY
PREFILLED_SYRINGE | INTRAMUSCULAR | Status: AC
  Filled 2022-05-31: qty 2

## 2022-05-31 MED ORDER — SODIUM CHLORIDE 0.9 % IV SOLN: INTRAVENOUS | Status: DC

## 2022-05-31 MED ORDER — MIDAZOLAM HCL 2 MG/ML PO SYRP: 8.0000 mg | ORAL_SOLUTION | Freq: Once | ORAL | Status: DC | PRN

## 2022-05-31 MED ORDER — ONDANSETRON HCL 4 MG/2ML IJ SOLN
4.0000 mg | Freq: Four times a day (QID) | INTRAMUSCULAR | Status: DC | PRN
Start: 2022-05-31 — End: 2022-05-31

## 2022-05-31 MED ORDER — METHYLPREDNISOLONE SODIUM SUCC 125 MG IJ SOLR: 125.0000 mg | Freq: Once | INTRAMUSCULAR | Status: DC | PRN

## 2022-05-31 MED ORDER — DIPHENHYDRAMINE HCL 50 MG/ML IJ SOLN
50.0000 mg | Freq: Once | INTRAMUSCULAR | Status: DC | PRN
Start: 2022-05-31 — End: 2022-05-31

## 2022-05-31 MED ORDER — MIDAZOLAM HCL 2 MG/2ML IJ SOLN
INTRAMUSCULAR | Status: DC | PRN
  Administered 2022-05-31: 2 mg via INTRAVENOUS
  Administered 2022-05-31: 1 mg via INTRAVENOUS

## 2022-05-31 MED ORDER — DIPHENHYDRAMINE HCL 50 MG/ML IJ SOLN
INTRAMUSCULAR | Status: AC
  Filled 2022-05-31: qty 1

## 2022-05-31 MED ORDER — MIDAZOLAM HCL 2 MG/2ML IJ SOLN
INTRAMUSCULAR | Status: AC
  Filled 2022-05-31: qty 4

## 2022-05-31 MED ORDER — FENTANYL CITRATE (PF) 100 MCG/2ML IJ SOLN
INTRAMUSCULAR | Status: DC | PRN
Start: 2022-05-31 — End: 2022-05-31
  Administered 2022-05-31: 50 ug via INTRAVENOUS
  Administered 2022-05-31: 25 ug via INTRAVENOUS

## 2022-05-31 MED ORDER — FAMOTIDINE 20 MG PO TABS
40.0000 mg | ORAL_TABLET | Freq: Once | ORAL | Status: DC | PRN
Start: 2022-05-31 — End: 2022-05-31

## 2022-05-31 SURGICAL SUPPLY — 10 items
DRAPE INCISE 23X17 IOBAN STRL (DRAPES) ×1
DRAPE INCISE 23X17 STRL (DRAPES) IMPLANT
DRAPE INCISE IOBAN 23X17 STRL (DRAPES) ×1 IMPLANT
NDL ENTRY 21GA 7CM ECHOTIP (NEEDLE) IMPLANT
NEEDLE ENTRY 21GA 7CM ECHOTIP (NEEDLE) ×1 IMPLANT
PACK ANGIOGRAPHY (CUSTOM PROCEDURE TRAY) ×1 IMPLANT
SET INTRO CAPELLA COAXIAL (SET/KITS/TRAYS/PACK) IMPLANT
SET VENACAVA FILTER RETRIEVAL (MISCELLANEOUS) IMPLANT
SHEATH PROBE COVER 6X72 (BAG) IMPLANT
WIRE GUIDERIGHT .035X150 (WIRE) IMPLANT

## 2022-05-31 NOTE — Op Note (Signed)
  OPERATIVE NOTE   PRE-OPERATIVE DIAGNOSIS: DVT with PE  POST-OPERATIVE DIAGNOSIS: Same  PROCEDURE: Retrieval of IVC Filter Inferior Vena Cavagram  SURGEON: Katha Cabal, M.D.  ANESTHESIA:  Conscious sedation was administered under my direct supervision by the interventional radiology RN. IV Versed plus fentanyl were utilized. Continuous ECG, pulse oximetry and blood pressure was monitored throughout the entire procedure. Conscious sedation was for a total of 22 minutes.  ESTIMATED BLOOD LOSS: Minimal cc  FINDING(S):inferior vena cava is widely patent filter is in place in good position. Filter is removed without incident  SPECIMEN(S):  IVC filter intact  INDICATIONS:   Dana Bautista is a 76 y.o. female who presents with prior DVT. The patient has now undergoing successful joint replacement. Therefore, the IVC filter is recommended to be removed. The risks and benefits were reviewed with the patient all questions were answered and they agreed to proceed with IVC filter retrieval. Oral anticoagulation will be continued.  DESCRIPTION: After obtaining full informed written consent, the patient was brought back to the Special Procedure Suite and placed in the supine position.  The patient received IV antibiotics prior to induction.  After obtaining adequate sedation, the patient was prepped and draped in the standard fashion and appropriate time out is called.     Ultrasound was placed in a sterile sleeve.The right neck was then imaged with ultrasound.   Jugular vein was identified it is echolucent and homogeneous indicating patency. 1% lidocaine is then infiltrated under ultrasound visualization and subsequently a Seldinger needle is inserted under real-time ultrasound guidance.  J-wire is then advanced into the inferior vena cava under fluoroscopic guidance. With the tip of the sheath at the confluence of the iliac veins inferior vena caval imaging is performed.  After review of the  image the sheath is repositioned to above the filter and the snares introduced. Snares opened and the hook is secured without difficulty. The filter is then collapsed within the sheath and removed without difficulty.  Sheath is removed by pressures held the patient tolerated the procedure well and there were no immediate complications.  Interpretation: inferior vena cava is widely patent filter is in place in good position. Filter is removed without incident.     COMPLICATIONS: None  CONDITION: [name], M.D. [facility] Office: [phone]   05/31/2022, 8:46 AM

## 2022-05-31 NOTE — Interval H&P Note (Signed)
History and Physical Interval Note:  05/31/2022 7:55 AM  Dana Bautista  has presented today for surgery, with the diagnosis of IVC filter removal   DVT.  The various methods of treatment have been discussed with the patient and family. After consideration of risks, benefits and other options for treatment, the patient has consented to  Procedure(s): IVC FILTER REMOVAL (N/A) as a surgical intervention.  The patient's history has been reviewed, patient examined, no change in status, stable for surgery.  I have reviewed the patient's chart and labs.  Questions were answered to the patient's satisfaction.     [name]

## 2022-06-13 ENCOUNTER — Encounter: Payer: Self-pay | Admitting: Urology

## 2022-06-13 ENCOUNTER — Ambulatory Visit (INDEPENDENT_AMBULATORY_CARE_PROVIDER_SITE_OTHER): Payer: Medicare Other | Admitting: Urology

## 2022-06-13 VITALS — BP 108/74 | HR 69 | Ht 66.0 in | Wt 180.0 lb

## 2022-06-13 DIAGNOSIS — R3915 Urgency of urination: Secondary | ICD-10-CM

## 2022-06-13 DIAGNOSIS — N3281 Overactive bladder: Secondary | ICD-10-CM

## 2022-06-13 LAB — URINALYSIS, COMPLETE
Bilirubin, UA: NEGATIVE
Glucose, UA: NEGATIVE
Ketones, UA: NEGATIVE
Nitrite, UA: POSITIVE — AB
Specific Gravity, UA: 1.015 (ref 1.005–1.030)
Urobilinogen, Ur: 0.2 mg/dL (ref 0.2–1.0)
pH, UA: 5.5 (ref 5.0–7.5)

## 2022-06-13 LAB — MICROSCOPIC EXAMINATION: WBC, UA: >30 /hpf — AB (ref 0–5)

## 2022-06-13 LAB — BLADDER SCAN AMB NON-IMAGING

## 2022-06-13 MED ORDER — MIRABEGRON ER 50 MG PO TB24: 50.0000 mg | ORAL_TABLET | Freq: Every day | ORAL | 0 refills | Status: DC

## 2022-06-13 MED ORDER — NITROFURANTOIN MACROCRYSTAL 100 MG PO CAPS: 100.0000 mg | ORAL_CAPSULE | Freq: Two times a day (BID) | ORAL | 0 refills | Status: AC

## 2022-06-13 NOTE — Progress Notes (Signed)
06/13/2022 9:33 AM   Dana Bautista [birth year] [id number]  Referring provider: Peggye Form, NP [place],  [place] [address]  Chief Complaint  Patient presents with   New Patient (Initial Visit)   Urinary Frequency   Over Active Bladder    HPI: I was consulted to assess the patient's nighttime frequency and nocturia.  It is quite sudden.  She is continent.  She does not have bedwetting.  She gets up 2-3 times a night or more often.  It is significant affecting her sleep.  She has intermittent leg edema.  No diuretic.  She is failed Vesicare and probably oxybutynin.  She could not feel Myrbetriq because insurance never covered it.  Flow is generally reasonable.  Sometimes she has small volume voiding.  She does feel empty.  Has not had a hysterectomy  No neurologic issues but has a cane for her hip.  Normal bowel movements.   PMH: Past Medical History:  Diagnosis Date   Anemia    Arthritis    osteoarthriist   Cancer ([place])    skin   Carotid stenosis, asymptomatic, bilateral    Cellulitis    Complication of anesthesia    nausea   DVT (deep venous thrombosis) ([place]) 09/2019   Heart murmur    History of methicillin resistant staphylococcus aureus (MRSA) 2020   Mitral valve regurgitation    Pericardial effusion    Pleural effusion    PONV (postoperative nausea and vomiting)    Pre-diabetes    Pulmonary embolism ([place]) 2020   Sleep apnea    uses cpap   Tendonitis    outer aspect of right foot   Thyroid nodule    UTI (lower urinary tract infection)    Vitamin B 12 deficiency     Surgical History: Past Surgical History:  Procedure Laterality Date   ABDOMINAL HYSTERECTOMY     APPENDECTOMY     COLONOSCOPY     FRACTURE SURGERY     left wrist   FRACTURE SURGERY Left    wrist   INCISION AND DRAINAGE Right 06/03/2021   Procedure: INCISION AND DRAINAGE-Right Index Finger;  Surgeon: Corky Mull, MD;  Location: [facility];  Service: Orthopedics;   Laterality: Right;   IVC FILTER INSERTION N/A 03/15/2022   Procedure: IVC FILTER INSERTION;  Surgeon: Katha Cabal, MD;  Location: [facility];  Service: Cardiovascular;  Laterality: N/A;   IVC FILTER REMOVAL N/A 05/31/2022   Procedure: IVC FILTER REMOVAL;  Surgeon: Katha Cabal, MD;  Location: [facility];  Service: Cardiovascular;  Laterality: N/A;   KNEE SURGERY Left    debridement   TONSILLECTOMY     TOTAL HIP ARTHROPLASTY Right 03/22/2022   Procedure: TOTAL HIP ARTHROPLASTY;  Surgeon: Corky Mull, MD;  Location: [facility];  Service: Orthopedics;  Laterality: Right;    Home Medications:  Allergies as of 06/13/2022       Reactions   Codeine Nausea And Vomiting   Elemental Sulfur Other (See Comments)   Lip swelling, difficulty breathing   Gabapentin Nausea And Vomiting   Levaquin [levofloxacin] Hives   Penicillins Hives, Other (See Comments)   Did it involve swelling of the face/tongue/throat, SOB, or low BP? Unknown Did it involve sudden or severe rash/hives, skin peeling, or any reaction on the inside of your mouth or nose? Yes Did you need to seek medical attention at a hospital or doctor's office? Yes When did it last happen? many years  If all above answers are "NO", may proceed with cephalosporin use.   Sulfa Antibiotics Other (See Comments)   Other reaction(s): lip swelling   Latex Rash   Peeling skin        Medication List        Accurate as of June 13, 2022  9:33 AM. If you have any questions, ask your nurse or doctor.          acetaminophen 500 MG tablet Commonly known as: TYLENOL Take 500-1,000 mg by mouth every 6 (six) hours as needed for mild pain or moderate pain.   apixaban 5 MG Tabs tablet Commonly known as: ELIQUIS Take by mouth.   Calcium Citrate Powd Take 5 mLs by mouth daily.   cyanocobalamin 1000 MCG/ML injection Commonly known as: VITAMIN B12 Inject 1,000 mcg into the muscle every 30 (thirty) days.    D-3-5 125 MCG (5000 UT) capsule Generic drug: Cholecalciferol Take 5,000 Units by mouth daily.   docusate sodium 100 MG capsule Commonly known as: COLACE Take 100 mg by mouth daily.   ferrous sulfate 325 (65 FE) MG tablet Take 325 mg by mouth daily with breakfast.   multivitamin with minerals tablet Take 2 tablets by mouth daily. Gummy        Allergies:  Allergies  Allergen Reactions   Codeine Nausea And Vomiting   Elemental Sulfur Other (See Comments)    Lip swelling, difficulty breathing   Gabapentin Nausea And Vomiting   Levaquin [Levofloxacin] Hives   Penicillins Hives and Other (See Comments)    Did it involve swelling of the face/tongue/throat, SOB, or low BP? Unknown Did it involve sudden or severe rash/hives, skin peeling, or any reaction on the inside of your mouth or nose? Yes Did you need to seek medical attention at a hospital or doctor's office? Yes When did it last happen? many years If all above answers are "NO", may proceed with cephalosporin use.    Sulfa Antibiotics Other (See Comments)    Other reaction(s): lip swelling   Latex Rash    Peeling skin    Family History: Family History  Problem Relation Age of Onset   Breast cancer Mother    Asthma Mother    Lung cancer Father    Heart disease Brother    Heart disease Maternal Grandfather    Throat cancer Maternal Grandfather     Social History:  reports that she has never smoked. She has never been exposed to tobacco smoke. She has never used smokeless tobacco. She reports that she does not drink alcohol and does not use drugs.  ROS:                                        Physical Exam: There were no vitals taken for this visit.  Constitutional:  Alert and oriented, No acute distress. HEENT: [place] AT, moist mucus membranes.  Trachea midline, no masses.   Laboratory Data: Lab Results  Component Value Date   WBC 9.7 03/24/2022   HGB 8.7 (L) 03/24/2022   HCT 26.5  (L) 03/24/2022   MCV 86.0 03/24/2022   PLT 196 03/24/2022    Lab Results  Component Value Date   CREATININE 0.43 (L) 03/23/2022    No results found for: "PSA"  No results found for: "TESTOSTERONE"  No results found for: "HGBA1C"  Urinalysis    Component Value Date/Time   COLORURINE  STRAW (A) 03/15/2022 0828   APPEARANCEUR CLEAR (A) 03/15/2022 0828   LABSPEC 1.003 (L) 03/15/2022 0828   PHURINE 5.0 03/15/2022 0828   GLUCOSEU NEGATIVE 03/15/2022 0828   HGBUR NEGATIVE 03/15/2022 0828   BILIRUBINUR NEGATIVE 03/15/2022 0828   KETONESUR 5 (A) 03/15/2022 0828   PROTEINUR NEGATIVE 03/15/2022 0828   NITRITE NEGATIVE 03/15/2022 0828   LEUKOCYTESUR NEGATIVE 03/15/2022 0828    Pertinent Imaging: Urine reviewed.  Urine sent for culture.  Assessment & Plan: Patient has nighttime frequency with urgency.  Cost of medication may be an issue.  Pathophysiology of nighttime frequency discussed.  Patient did have a positive urine including microscopic hematuria.  She had a CT stone protocol December 2022.  I sent urine for culture.  She will come back for cystoscopy and I will check her urine again.  If she still has blood in the urine I would recommend repeat CT scan with dye.  Recognizing she may not be able to afford it I want to give her Myrbetriq samples to see if it actually works.  Based on allergies I gave her Macrodantin 100 mg twice a day for 7 days.  I gave her 8 weeks of Myrbetriq samples.  Come back for cystoscopy.  She does think she has an infection.  Over the weekend she was having some discomfort and it felt like a UTI  There are no diagnoses linked to this encounter.  No follow-ups on file.  Reece Packer, MD  [place] [address] [place], [place] [address] [phone]

## 2022-06-16 LAB — CULTURE, URINE COMPREHENSIVE

## 2022-06-17 ENCOUNTER — Other Ambulatory Visit: Payer: Self-pay | Admitting: *Deleted

## 2022-06-17 MED ORDER — DOXYCYCLINE HYCLATE 100 MG PO CAPS: 100.0000 mg | ORAL_CAPSULE | Freq: Two times a day (BID) | ORAL | 0 refills | Status: AC

## 2022-06-20 ENCOUNTER — Inpatient Hospital Stay: Payer: Medicare Other | Attending: Oncology

## 2022-06-20 DIAGNOSIS — Z803 Family history of malignant neoplasm of breast: Secondary | ICD-10-CM | POA: Diagnosis not present

## 2022-06-20 DIAGNOSIS — Z801 Family history of malignant neoplasm of trachea, bronchus and lung: Secondary | ICD-10-CM | POA: Insufficient documentation

## 2022-06-20 DIAGNOSIS — Z86718 Personal history of other venous thrombosis and embolism: Secondary | ICD-10-CM | POA: Diagnosis not present

## 2022-06-20 DIAGNOSIS — R350 Frequency of micturition: Secondary | ICD-10-CM | POA: Insufficient documentation

## 2022-06-20 DIAGNOSIS — I959 Hypotension, unspecified: Secondary | ICD-10-CM | POA: Insufficient documentation

## 2022-06-20 DIAGNOSIS — Z800 Family history of malignant neoplasm of digestive organs: Secondary | ICD-10-CM | POA: Diagnosis not present

## 2022-06-20 DIAGNOSIS — D649 Anemia, unspecified: Secondary | ICD-10-CM | POA: Insufficient documentation

## 2022-06-20 DIAGNOSIS — Z86711 Personal history of pulmonary embolism: Secondary | ICD-10-CM | POA: Diagnosis not present

## 2022-06-20 DIAGNOSIS — Z7901 Long term (current) use of anticoagulants: Secondary | ICD-10-CM | POA: Diagnosis not present

## 2022-06-20 DIAGNOSIS — J90 Pleural effusion, not elsewhere classified: Secondary | ICD-10-CM | POA: Diagnosis not present

## 2022-06-20 DIAGNOSIS — Z90710 Acquired absence of both cervix and uterus: Secondary | ICD-10-CM | POA: Insufficient documentation

## 2022-06-20 DIAGNOSIS — Z87440 Personal history of urinary (tract) infections: Secondary | ICD-10-CM | POA: Insufficient documentation

## 2022-06-20 LAB — CBC WITH DIFFERENTIAL/PLATELET
Abs Immature Granulocytes: 0.02 10*3/uL (ref 0.00–0.07)
Basophils Absolute: 0 10*3/uL (ref 0.0–0.1)
Basophils Relative: 1 %
Eosinophils Absolute: 0.1 10*3/uL (ref 0.0–0.5)
Eosinophils Relative: 1 %
HCT: 31.8 % — ABNORMAL LOW (ref 36.0–46.0)
Hemoglobin: 10.4 g/dL — ABNORMAL LOW (ref 12.0–15.0)
Immature Granulocytes: 0 %
Lymphocytes Relative: 32 %
Lymphs Abs: 1.9 10*3/uL (ref 0.7–4.0)
MCH: 28.3 pg (ref 26.0–34.0)
MCHC: 32.7 g/dL (ref 30.0–36.0)
MCV: 86.4 fL (ref 80.0–100.0)
Monocytes Absolute: 0.5 10*3/uL (ref 0.1–1.0)
Monocytes Relative: 9 %
Neutro Abs: 3.3 10*3/uL (ref 1.7–7.7)
Neutrophils Relative %: 57 %
Platelets: 260 10*3/uL (ref 150–400)
RBC: 3.68 MIL/uL — ABNORMAL LOW (ref 3.87–5.11)
RDW: 13.1 % (ref 11.5–15.5)
WBC: 5.8 10*3/uL (ref 4.0–10.5)
nRBC: 0 % (ref 0.0–0.2)

## 2022-06-20 LAB — COMPREHENSIVE METABOLIC PANEL
ALT: 9 U/L (ref 0–44)
AST: 20 U/L (ref 15–41)
Albumin: 3.7 g/dL (ref 3.5–5.0)
Alkaline Phosphatase: 60 U/L (ref 38–126)
Anion gap: 5 (ref 5–15)
BUN: 22 mg/dL (ref 8–23)
CO2: 23 mmol/L (ref 22–32)
Calcium: 9 mg/dL (ref 8.9–10.3)
Chloride: 109 mmol/L (ref 98–111)
Creatinine, Ser: 0.65 mg/dL (ref 0.44–1.00)
GFR, Estimated: >60 mL/min (ref >60)
Glucose, Bld: 134 mg/dL — ABNORMAL HIGH (ref 70–99)
Potassium: 4.2 mmol/L (ref 3.5–5.1)
Sodium: 137 mmol/L (ref 135–145)
Total Bilirubin: 0.7 mg/dL (ref 0.3–1.2)
Total Protein: 6.6 g/dL (ref 6.5–8.1)

## 2022-06-20 LAB — FERRITIN: Ferritin: 15 ng/mL (ref 11–307)

## 2022-06-20 LAB — RETIC PANEL
Immature Retic Fract: 5.3 % (ref 2.3–15.9)
RBC.: 3.69 MIL/uL — ABNORMAL LOW (ref 3.87–5.11)
Retic Count, Absolute: 37.3 10*3/uL (ref 19.0–186.0)
Retic Ct Pct: 1 % (ref 0.4–3.1)
Reticulocyte Hemoglobin: 32.1 pg (ref >27.9)

## 2022-06-20 LAB — VITAMIN B12: Vitamin B-12: 254 pg/mL (ref 180–914)

## 2022-06-20 LAB — FOLATE: Folate: 13.3 ng/mL (ref >5.9)

## 2022-06-20 LAB — IRON AND TIBC
Iron: 55 ug/dL (ref 28–170)
Saturation Ratios: 18 % (ref 10.4–31.8)
TIBC: 305 ug/dL (ref 250–450)
UIBC: 250 ug/dL

## 2022-06-21 ENCOUNTER — Other Ambulatory Visit: Payer: Self-pay

## 2022-06-21 ENCOUNTER — Encounter (INDEPENDENT_AMBULATORY_CARE_PROVIDER_SITE_OTHER): Payer: Self-pay

## 2022-06-21 ENCOUNTER — Ambulatory Visit (INDEPENDENT_AMBULATORY_CARE_PROVIDER_SITE_OTHER): Payer: Medicare Other | Admitting: Nurse Practitioner

## 2022-06-21 DIAGNOSIS — D649 Anemia, unspecified: Secondary | ICD-10-CM

## 2022-06-21 DIAGNOSIS — Z86718 Personal history of other venous thrombosis and embolism: Secondary | ICD-10-CM

## 2022-06-22 ENCOUNTER — Encounter: Payer: Self-pay | Admitting: Oncology

## 2022-06-22 ENCOUNTER — Inpatient Hospital Stay (HOSPITAL_BASED_OUTPATIENT_CLINIC_OR_DEPARTMENT_OTHER): Payer: Medicare Other | Admitting: Oncology

## 2022-06-22 VITALS — BP 97/46 | HR 70 | Resp 18 | Ht 66.0 in | Wt 180.0 lb

## 2022-06-22 DIAGNOSIS — Z86711 Personal history of pulmonary embolism: Secondary | ICD-10-CM

## 2022-06-22 DIAGNOSIS — R300 Dysuria: Secondary | ICD-10-CM | POA: Insufficient documentation

## 2022-06-22 DIAGNOSIS — D508 Other iron deficiency anemias: Secondary | ICD-10-CM | POA: Diagnosis not present

## 2022-06-22 DIAGNOSIS — Z86718 Personal history of other venous thrombosis and embolism: Secondary | ICD-10-CM | POA: Diagnosis not present

## 2022-06-22 DIAGNOSIS — I9589 Other hypotension: Secondary | ICD-10-CM | POA: Diagnosis not present

## 2022-06-22 DIAGNOSIS — I959 Hypotension, unspecified: Secondary | ICD-10-CM | POA: Insufficient documentation

## 2022-06-22 DIAGNOSIS — D509 Iron deficiency anemia, unspecified: Secondary | ICD-10-CM | POA: Insufficient documentation

## 2022-06-22 NOTE — Assessment & Plan Note (Signed)
Hemoglobin is slightly decreased, likely due to blood loss form her hip surgery.  Recommend patient to continue oral ferrous sulfate, increase to twice daily.

## 2022-06-22 NOTE — Assessment & Plan Note (Signed)
Continue Eliquis 2.5mg BID.  

## 2022-06-22 NOTE — Progress Notes (Signed)
Patient has been feeling very tired. BP today at ortho doctor was low 84/60 and 97/46 here.

## 2022-06-22 NOTE — Assessment & Plan Note (Signed)
Labs are reviewed and discussed with patient.  Continue Eliquis 2.5 mg twice daily.  Stable hemoglobin. 

## 2022-06-22 NOTE — Assessment & Plan Note (Signed)
Encourage oral hydration.  She is asymptomatic.  Recommend patient to monitor BP at home and contact her PCP if Bp is persistently low.

## 2022-06-22 NOTE — Progress Notes (Signed)
Chronic Hematology/Oncology Progress note [phone] [phone]      Patient Care Team: Marinda Elk, MD as PCP - General (Physician Assistant) Earlie Server, MD as Consulting Physician (Oncology)  ASSESSMENT & PLAN:   History of deep vein thrombosis (DVT) of lower extremity Labs are reviewed and discussed with patient.  Continue Eliquis 2.5 mg twice daily.  Stable hemoglobin.  History of pulmonary embolism Continue Eliquis 2.'5mg'$  BID  Hypotension Encourage oral hydration.  She is asymptomatic.  Recommend patient to monitor BP at home and contact her PCP if Bp is persistently low.   Orders Placed This Encounter  Procedures   CBC with Differential/Platelet    Standing Status:   Future    Standing Expiration Date:   06/23/2023   Ferritin    Standing Status:   Future    Standing Expiration Date:   06/23/2023   Iron and TIBC    Standing Status:   Future    Standing Expiration Date:   06/23/2023   Follow-up in 4 months. All questions were answered. The patient knows to call the clinic with any problems, questions or concerns.  Earlie Server, MD, PhD [facility] Hematology Oncology 06/22/2022   CHIEF COMPLAINTS/REASON FOR VISIT:  Follow up for pulmonary  embolism  HISTORY OF PRESENTING ILLNESS:   Dana Bautista is a  76 y.o.  female with PMH listed below was seen in consultation at the request of  Marinda Elk, MD  for evaluation of pulmonary embolism  Patient was admitted from 10/04/2019-10/05/2019 due to acute bilateral pulmonary embolism and DVT.  Patient denies any immobilization factors prior to the events.  Her initial symptom was sudden onset of left-sided chest pain. 10/04/2019, CT chest angiogram showed acute bilateral lower lobe pulmonary embolism.  No right heart strain.  Small left pleural effusion. 10/05/2019 bilateral lower extremity ultrasound showed right calf posterior calf occlusive DVT.  Very low thrombus burden.  Patient was started on  anticoagulation and discharged on Eliquis. She has been on anticoagulation for slightly more than 6 months. She was referred to hematology for evaluation management. Patient reports that the left chest pain has completely resolved.  She was less active since the diagnosis of pulmonary embolism and has been deconditioned.  She has tried to exercise more and her exercise endurance has improved. Intermittently she experienced left lower extremity swelling. She denies any constitutional symptoms.  July 2021 -switched to Eliquis 2.'5mg'$  BID  INTERVAL HISTORY Dana Bautista is a 76 y.o. female who has above history reviewed by me today presents for follow up visit for management of history of thrombosis.  Patient has been on Eliquis 2.5 mg twice daily.  No bleeding events. Today patient was found to have low BP in the clinic.  Per patient her systolic blood pressure earlier at orthopedic surgeon's office was even lower 80s.  She has drink some water and in our office, BP was 97/46.  She denies any lightheadedness, nausea vomiting diarrhea.  Not on any BP medications. She is also on antibiotics for UTI and urinary frequency has significantly improved.  No fever or chills.  Review of Systems  Constitutional:  Negative for appetite change, chills, fatigue and fever.  HENT:   Negative for hearing loss and voice change.   Eyes:  Negative for eye problems.  Respiratory:  Negative for chest tightness and cough.   Cardiovascular:  Negative for chest pain.  Gastrointestinal:  Negative for abdominal distention, abdominal pain and blood in stool.  Endocrine: Negative  for hot flashes.  Genitourinary:  Negative for difficulty urinating and frequency.   Musculoskeletal:  Negative for arthralgias.  Skin:  Negative for itching and rash.  Neurological:  Negative for extremity weakness.  Hematological:  Negative for adenopathy.  Psychiatric/Behavioral:  Negative for confusion.     MEDICAL HISTORY:  Past Medical  History:  Diagnosis Date   Anemia    Arthritis    osteoarthriist   Cancer ([place])    skin   Carotid stenosis, asymptomatic, bilateral    Cellulitis    Complication of anesthesia    nausea   DVT (deep venous thrombosis) ([place]) 09/2019   Heart murmur    History of methicillin resistant staphylococcus aureus (MRSA) 2020   Mitral valve regurgitation    Pericardial effusion    Pleural effusion    PONV (postoperative nausea and vomiting)    Pre-diabetes    Pulmonary embolism ([place]) 2020   Sleep apnea    uses cpap   Tendonitis    outer aspect of right foot   Thyroid nodule    UTI (lower urinary tract infection)    Vitamin B 12 deficiency     SURGICAL HISTORY: Past Surgical History:  Procedure Laterality Date   ABDOMINAL HYSTERECTOMY     APPENDECTOMY     COLONOSCOPY     FRACTURE SURGERY     left wrist   FRACTURE SURGERY Left    wrist   INCISION AND DRAINAGE Right 06/03/2021   Procedure: INCISION AND DRAINAGE-Right Index Finger;  Surgeon: Corky Mull, MD;  Location: [facility] OR[facility];  Service: Orthopedics;  Laterality: Right;   IVC FILTER INSERTION N/A 03/15/2022   Procedure: IVC FILTER INSERTION;  Surgeon: Katha Cabal, MD;  Location: [place] [facility];  Service: Cardiovascular;  Laterality: N/A;   IVC FILTER REMOVAL N/A 05/31/2022   Procedure: IVC FILTER REMOVAL;  Surgeon: Katha Cabal, MD;  Location: [place] [facility];  Service: Cardiovascular;  Laterality: N/A;   KNEE SURGERY Left    debridement   TONSILLECTOMY     TOTAL HIP ARTHROPLASTY Right 03/22/2022   Procedure: TOTAL HIP ARTHROPLASTY;  Surgeon: Corky Mull, MD;  Location: [facility] OR[facility];  Service: Orthopedics;  Laterality: Right;    SOCIAL HISTORY: Social History   Socioeconomic History   Marital status: Married    Spouse name: Not on file   Number of children: Not on file   Years of education: Not on file   Highest education level: Not on file  Occupational History   Not on file  Tobacco Use   Smoking  status: Never    Passive exposure: Never   Smokeless tobacco: Never  Vaping Use   Vaping Use: Never used  Substance and Sexual Activity   Alcohol use: No   Drug use: No   Sexual activity: Not on file  Other Topics Concern   Not on file  Social History Narrative   Not on file   Social Determinants of Health   Financial Resource Strain: Not on file  Food Insecurity: Not on file  Transportation Needs: Not on file  Physical Activity: Not on file  Stress: Not on file  Social Connections: Not on file  Intimate Partner Violence: Not on file    FAMILY HISTORY: Family History  Problem Relation Age of Onset   Breast cancer Mother    Asthma Mother    Lung cancer Father    Heart disease Brother    Heart disease Maternal Grandfather    Throat  cancer Maternal Grandfather     ALLERGIES:  is allergic to codeine, elemental sulfur, gabapentin, levaquin [levofloxacin], penicillins, sulfa antibiotics, and latex.  MEDICATIONS:  Current Outpatient Medications  Medication Sig Dispense Refill   acetaminophen (TYLENOL) 500 MG tablet Take 500-1,000 mg by mouth every 6 (six) hours as needed for mild pain or moderate pain.     apixaban (ELIQUIS) 5 MG TABS tablet Take by mouth.     Calcium Citrate POWD Take 5 mLs by mouth daily.     Cholecalciferol (D-3-5) 125 MCG (5000 UT) capsule Take 5,000 Units by mouth daily.     cyanocobalamin (,VITAMIN B-12,) 1000 MCG/ML injection Inject 1,000 mcg into the muscle every 30 (thirty) days.     docusate sodium (COLACE) 100 MG capsule Take 100 mg by mouth daily.     doxycycline (VIBRAMYCIN) 100 MG capsule Take 1 capsule (100 mg total) by mouth every 12 (twelve) hours for 7 days. 14 capsule 0   ferrous sulfate 325 (65 FE) MG tablet Take 325 mg by mouth daily with breakfast.     mirabegron ER (MYRBETRIQ) 50 MG TB24 tablet Take 1 tablet (50 mg total) by mouth daily. 30 tablet 0   Multiple Vitamins-Minerals (MULTIVITAMIN WITH MINERALS) tablet Take 2 tablets by  mouth daily. Gummy     No current facility-administered medications for this visit.     PHYSICAL EXAMINATION: ECOG PERFORMANCE STATUS: 1 - Symptomatic but completely ambulatory Vitals:   06/22/22 1027 06/22/22 1029  BP:  (!) 97/46  Pulse:  70  Resp: 18 18  SpO2:  100%   Filed Weights   06/22/22 1027  Weight: 180 lb (81.6 kg)    Physical Exam Constitutional:      General: She is not in acute distress. HENT:     Head: Normocephalic and atraumatic.  Eyes:     General: No scleral icterus. Cardiovascular:     Rate and Rhythm: Normal rate and regular rhythm.     Heart sounds: Normal heart sounds.  Pulmonary:     Effort: Pulmonary effort is normal. No respiratory distress.     Breath sounds: No wheezing.  Abdominal:     General: Bowel sounds are normal. There is no distension.     Palpations: Abdomen is soft.  Musculoskeletal:        General: No deformity. Normal range of motion.     Cervical back: Normal range of motion and neck supple.     Comments: Trace edema, bilateral lower extremities.  varicose vein bilaterally.   Skin:    General: Skin is warm and dry.     Findings: No erythema or rash.  Neurological:     Mental Status: She is alert and oriented to person, place, and time. Mental status is at baseline.     Cranial Nerves: No cranial nerve deficit.     Coordination: Coordination normal.  Psychiatric:        Mood and Affect: Mood normal.     LABORATORY DATA:  I have reviewed the data as listed Lab Results  Component Value Date   WBC 5.8 06/20/2022   HGB 10.4 (L) 06/20/2022   HCT 31.8 (L) 06/20/2022   MCV 86.4 06/20/2022   PLT 260 06/20/2022   Recent Labs    12/13/21 1004 03/23/22 0347 06/20/22 1055  NA 136 132* 137  K 3.9 3.9 4.2  CL 102 103 109  CO2 '27 24 23  '$ GLUCOSE 97 113* 134*  BUN 16 6* 22  CREATININE 0.63 0.43*  0.65  CALCIUM 9.1 7.8* 9.0  GFRNONAA >60 >60 >60  PROT 7.0  --  6.6  ALBUMIN 3.9  --  3.7  AST 14*  --  20  ALT 10  --  9   ALKPHOS 56  --  60  BILITOT 0.7  --  0.7    Iron/TIBC/Ferritin/ %Sat    Component Value Date/Time   IRON 55 06/20/2022 1055   TIBC 305 06/20/2022 1055   FERRITIN 15 06/20/2022 1055   IRONPCTSAT 18 06/20/2022 1055    hypercoagulable state work up.  Negative prothrombin gene mutation, Factor V leiden mutation.  Negative anticardiolipin IgM, indeterminate level of anticardiolipin IgG, not meeting antiphospholipid syndrome diagnosis criteria.   RADIOGRAPHIC STUDIES: I have personally reviewed the radiological images as listed and agreed with the findings in the report. PERIPHERAL VASCULAR CATHETERIZATION  Result Date: 05/31/2022 See surgical note for result.

## 2022-07-05 ENCOUNTER — Other Ambulatory Visit (INDEPENDENT_AMBULATORY_CARE_PROVIDER_SITE_OTHER): Payer: Self-pay | Admitting: Nurse Practitioner

## 2022-07-05 DIAGNOSIS — I83893 Varicose veins of bilateral lower extremities with other complications: Secondary | ICD-10-CM

## 2022-07-06 ENCOUNTER — Encounter (INDEPENDENT_AMBULATORY_CARE_PROVIDER_SITE_OTHER): Payer: Self-pay | Admitting: Nurse Practitioner

## 2022-07-06 ENCOUNTER — Ambulatory Visit (INDEPENDENT_AMBULATORY_CARE_PROVIDER_SITE_OTHER): Payer: Medicare Other | Admitting: Nurse Practitioner

## 2022-07-06 ENCOUNTER — Ambulatory Visit (INDEPENDENT_AMBULATORY_CARE_PROVIDER_SITE_OTHER): Payer: Medicare Other

## 2022-07-06 VITALS — BP 106/67 | HR 69 | Resp 16 | Wt 180.0 lb

## 2022-07-06 DIAGNOSIS — I8312 Varicose veins of left lower extremity with inflammation: Secondary | ICD-10-CM

## 2022-07-06 DIAGNOSIS — I8311 Varicose veins of right lower extremity with inflammation: Secondary | ICD-10-CM

## 2022-07-06 DIAGNOSIS — R600 Localized edema: Secondary | ICD-10-CM | POA: Diagnosis not present

## 2022-07-06 DIAGNOSIS — I83893 Varicose veins of bilateral lower extremities with other complications: Secondary | ICD-10-CM

## 2022-07-07 ENCOUNTER — Encounter (INDEPENDENT_AMBULATORY_CARE_PROVIDER_SITE_OTHER): Payer: Self-pay | Admitting: Nurse Practitioner

## 2022-07-07 NOTE — Progress Notes (Addendum)
Subjective:    Patient ID: Dana Bautista, female    DOB: 12-Aug-1946, 76 y.o.   MRN: [id number] Chief Complaint  Patient presents with   Follow-up    Ultrasound follow up    The patient returns to the office for followup status post laser ablation of bilateral saphenous veins the patient notes multiple residual varicosities bilaterally which continued to hurt with dependent positions and remained tender to palpation.  The initial great saphenous vein ablation was done many years ago.  The patient also endorses worsening swelling in her legs despite utilizing conservative therapy including use of medical grade compression, elevation and activity.  The patient continues to wear graduated compression stockings but these are not eliminating the pain and discomfort. The patient continues to use over-the-counter anti-inflammatory medications to treat the pain and related symptoms but this has not given the patient relief.   The patient is otherwise done well and there have been no complications related to the laser procedure or interval changes in the patient's overall   Venous ultrasound post laser shows successful laser ablation of the bilateral saphenous veins, no DVT identified.     Review of Systems  Cardiovascular:  Positive for leg swelling.  All other systems reviewed and are negative.      Objective:   Physical Exam Vitals reviewed.  HENT:     Head: Normocephalic.  Cardiovascular:     Rate and Rhythm: Normal rate.     Pulses: Normal pulses.  Pulmonary:     Effort: Pulmonary effort is normal.  Musculoskeletal:     Right lower leg: Edema present.     Left lower leg: Edema present.  Skin:    General: Skin is warm and dry.  Neurological:     Mental Status: She is alert and oriented to person, place, and time.  Psychiatric:        Mood and Affect: Mood normal.        Behavior: Behavior normal.        Thought Content: Thought content normal.        Judgment: Judgment normal.      BP 106/67 (BP Location: Left Arm)   Pulse 69   Resp 16   Wt 180 lb (81.6 kg)   BMI 29.05 kg/m   Past Medical History:  Diagnosis Date   Anemia    Arthritis    osteoarthriist   Cancer ([facility])    skin   Carotid stenosis, asymptomatic, bilateral    Cellulitis    Complication of anesthesia    nausea   DVT (deep venous thrombosis) ([place]) 09/2019   Heart murmur    History of methicillin resistant staphylococcus aureus (MRSA) 2020   Mitral valve regurgitation    Pericardial effusion    Pleural effusion    PONV (postoperative nausea and vomiting)    Pre-diabetes    Pulmonary embolism ([place]) 2020   Sleep apnea    uses cpap   Tendonitis    outer aspect of right foot   Thyroid nodule    UTI (lower urinary tract infection)    Vitamin B 12 deficiency     Social History   Socioeconomic History   Marital status: Married    Spouse name: Not on file   Number of children: Not on file   Years of education: Not on file   Highest education level: Not on file  Occupational History   Not on file  Tobacco Use   Smoking status: Never  Passive exposure: Never   Smokeless tobacco: Never  Vaping Use   Vaping Use: Never used  Substance and Sexual Activity   Alcohol use: No   Drug use: No   Sexual activity: Not on file  Other Topics Concern   Not on file  Social History Narrative   Not on file   Social Determinants of Health   Financial Resource Strain: Not on file  Food Insecurity: Not on file  Transportation Needs: Not on file  Physical Activity: Not on file  Stress: Not on file  Social Connections: Not on file  Intimate Partner Violence: Not on file    Past Surgical History:  Procedure Laterality Date   ABDOMINAL HYSTERECTOMY     APPENDECTOMY     COLONOSCOPY     FRACTURE SURGERY     left wrist   FRACTURE SURGERY Left    wrist   INCISION AND DRAINAGE Right 06/03/2021   Procedure: INCISION AND DRAINAGE-Right Index Finger;  Surgeon: Corky Mull, MD;   Location: [facility];  Service: Orthopedics;  Laterality: Right;   IVC FILTER INSERTION N/A 03/15/2022   Procedure: IVC FILTER INSERTION;  Surgeon: Katha Cabal, MD;  Location: [place] [facility];  Service: Cardiovascular;  Laterality: N/A;   IVC FILTER REMOVAL N/A 05/31/2022   Procedure: IVC FILTER REMOVAL;  Surgeon: Katha Cabal, MD;  Location: [place] [facility];  Service: Cardiovascular;  Laterality: N/A;   KNEE SURGERY Left    debridement   TONSILLECTOMY     TOTAL HIP ARTHROPLASTY Right 03/22/2022   Procedure: TOTAL HIP ARTHROPLASTY;  Surgeon: Corky Mull, MD;  Location: [facility];  Service: Orthopedics;  Laterality: Right;    Family History  Problem Relation Age of Onset   Breast cancer Mother    Asthma Mother    Lung cancer Father    Heart disease Brother    Heart disease Maternal Grandfather    Throat cancer Maternal Grandfather     Allergies  Allergen Reactions   Codeine Nausea And Vomiting   Elemental Sulfur Other (See Comments)    Lip swelling, difficulty breathing   Gabapentin Nausea And Vomiting   Levaquin [Levofloxacin] Hives   Penicillins Hives and Other (See Comments)    Did it involve swelling of the face/tongue/throat, SOB, or low BP? Unknown Did it involve sudden or severe rash/hives, skin peeling, or any reaction on the inside of your mouth or nose? Yes Did you need to seek medical attention at a hospital or doctor's office? Yes When did it last happen? many years If all above answers are "NO", may proceed with cephalosporin use.    Sulfa Antibiotics Other (See Comments)    Other reaction(s): lip swelling   Latex Rash    Peeling skin       Latest Ref Rng & Units 06/20/2022   10:55 AM 03/24/2022    3:48 AM 03/23/2022    3:47 AM  CBC  WBC 4.0 - 10.5 K/uL 5.8  9.7  6.2   Hemoglobin 12.0 - 15.0 g/dL 10.4  8.7  8.3   Hematocrit 36.0 - 46.0 % 31.8  26.5  25.5   Platelets 150 - 400 K/uL 260  196  180       CMP     Component Value  Date/Time   NA 137 06/20/2022 1055   K 4.2 06/20/2022 1055   CL 109 06/20/2022 1055   CO2 23 06/20/2022 1055   GLUCOSE 134 (H) 06/20/2022 1055  BUN 22 06/20/2022 1055   CREATININE 0.65 06/20/2022 1055   CALCIUM 9.0 06/20/2022 1055   PROT 6.6 06/20/2022 1055   ALBUMIN 3.7 06/20/2022 1055   AST 20 06/20/2022 1055   ALT 9 06/20/2022 1055   ALKPHOS 60 06/20/2022 1055   BILITOT 0.7 06/20/2022 1055   GFRNONAA >60 06/20/2022 1055   GFRAA >60 05/01/2020 1102     No results found.     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  The risks, benefits and alternative therapies were reviewed in detail with the patient.  All questions were answered.  The patient agrees to proceed with sclerotherapy at their convenience.  The patient will continue wearing the graduated compression stockings and using the over-the-counter pain medications to treat her symptoms.      - VAS [place] LOWER EXTREMITY VENOUS REFLUX   2. Leg edema The patient will continue with conservative therapy as outlined above.  We will follow-up with the patient with her progress and if conservative therapy does not improve her swelling, we will discuss lymphedema pump.     Current Outpatient Medications on File Prior to Visit  Medication Sig Dispense Refill   acetaminophen (TYLENOL) 500 MG tablet Take 500-1,000 mg by mouth every 6 (six) hours as needed for mild pain or moderate pain.     apixaban (ELIQUIS) 5 MG TABS tablet Take by mouth.     Calcium Citrate POWD Take 5 mLs by mouth daily.     Cholecalciferol (D-3-5) 125 MCG (5000 UT) capsule Take 5,000 Units  by mouth daily.     cyanocobalamin (,VITAMIN B-12,) 1000 MCG/ML injection Inject 1,000 mcg into the muscle every 30 (thirty) days.     docusate sodium (COLACE) 100 MG capsule Take 100 mg by mouth daily.     ferrous sulfate 325 (65 FE) MG tablet Take 325 mg by mouth daily with breakfast.     mirabegron ER (MYRBETRIQ) 50 MG TB24 tablet Take 1 tablet (50 mg total) by mouth daily. 30 tablet 0   Multiple Vitamins-Minerals (MULTIVITAMIN WITH MINERALS) tablet Take 2 tablets by mouth daily. Gummy     No current facility-administered medications on file prior to visit.    There are no Patient Instructions on file for this visit. No follow-ups on file.   Kris Hartmann, NP

## 2022-07-10 ENCOUNTER — Ambulatory Visit (INDEPENDENT_AMBULATORY_CARE_PROVIDER_SITE_OTHER): Payer: Medicare Other

## 2022-07-10 ENCOUNTER — Ambulatory Visit
Admission: EM | Admit: 2022-07-10 | Discharge: 2022-07-10 | Disposition: A | Discharge status: Home / Self Care | Payer: Medicare Other | Attending: Emergency Medicine | Admitting: Emergency Medicine

## 2022-07-10 DIAGNOSIS — M25522 Pain in left elbow: Secondary | ICD-10-CM

## 2022-07-10 DIAGNOSIS — M25521 Pain in right elbow: Secondary | ICD-10-CM

## 2022-07-10 MED ORDER — CEPHALEXIN 500 MG PO CAPS: 500.0000 mg | ORAL_CAPSULE | Freq: Two times a day (BID) | ORAL | 0 refills | Status: AC

## 2022-07-10 MED ORDER — DICLOFENAC SODIUM 1 % EX GEL: 2.0000 g | Freq: Four times a day (QID) | CUTANEOUS | 0 refills | Status: DC

## 2022-07-10 NOTE — ED Provider Notes (Signed)
[facility]    CSN: [id number] Arrival date & time: 07/10/22  0809      History   Chief Complaint Chief Complaint  Patient presents with   Elbow Pain    HPI Dana Bautista is a 76 y.o. female.   Patient presents with right elbow pain and swelling beginning 1 day ago without injury, sooner precipitating event or trauma.  Feels as if a bump is sitting over the bone.  Of motion of the arm is intact but pain is elicited with flexion.  Has attempted use of ice which has been ineffective.    Past Medical History:  Diagnosis Date   Anemia    Arthritis    osteoarthriist   Cancer ([place])    skin   Carotid stenosis, asymptomatic, bilateral    Cellulitis    Complication of anesthesia    nausea   DVT (deep venous thrombosis) ([place]) 09/2019   Heart murmur    History of methicillin resistant staphylococcus aureus (MRSA) 2020   Mitral valve regurgitation    Pericardial effusion    Pleural effusion    PONV (postoperative nausea and vomiting)    Pre-diabetes    Pulmonary embolism ([place]) 2020   Sleep apnea    uses cpap   Tendonitis    outer aspect of right foot   Thyroid nodule    UTI (lower urinary tract infection)    Vitamin B 12 deficiency     Patient Active Problem List   Diagnosis Date Noted   Dysuria 06/22/2022   IDA (iron deficiency anemia) 06/22/2022   Hypotension 06/22/2022   Status post total hip replacement, 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[place]) 02/08/2022   Primary osteoarthritis of right hip 02/08/2022   Dog bite of right hand 06/03/2021   Normocytic anemia 06/03/2021   Suppurative tenosynovitis of flexor tendon of right hand 06/03/2021   Flexor tenosynovitis of finger    Osteoporosis 03/31/2021   History of deep vein thrombosis (DVT) of lower extremity 10/20/2020   History of pulmonary embolism 10/20/2020   Lower leg DVT (deep venous thromboembolism), acute, right ([place]) 10/05/2019   Multiple subsegmental pulmonary emboli without acute cor  pulmonale ([place]) 10/04/2019   Abnormal mammogram 01/25/2019   Encounter for other physical therapy 01/25/2019   Family history of malignant neoplasm of breast 01/25/2019   Gross hematuria 01/25/2019   Urethral caruncle 01/25/2019   Urinary urgency 01/25/2019   Chronic pain of both knees 10/04/2018   Primary osteoarthritis of right knee 10/04/2018   Degenerative disc disease, cervical 09/01/2018   Neck pain 07/12/2018   OSA (obstructive sleep apnea) 06/14/2018   Apneic episode 02/23/2018   Vitamin B 12 deficiency 08/10/2017   Anemia due to vitamin B12 deficiency 04/13/2017   Chronic constipation 02/25/2017   Carotid stenosis, asymptomatic, bilateral 10/06/2016   H/O mitral valve prolapse 06/13/2016   Vitamin D deficiency, unspecified 12/21/2015   Chronic right-sided low back pain without sciatica 12/16/2015   Urge urinary incontinence 12/16/2015    Past Surgical History:  Procedure Laterality Date   ABDOMINAL HYSTERECTOMY     APPENDECTOMY     COLONOSCOPY     FRACTURE SURGERY     left wrist   FRACTURE SURGERY Left    wrist   INCISION AND DRAINAGE Right 06/03/2021   Procedure: INCISION AND DRAINAGE-Right Index Finger;  Surgeon: Corky Mull, MD;  Location: [facility];  Service: Orthopedics;  Laterality: Right;   IVC FILTER INSERTION N/A 03/15/2022   Procedure:  IVC FILTER INSERTION;  Surgeon: Katha Cabal, MD;  Location: [facility];  Service: Cardiovascular;  Laterality: N/A;   IVC FILTER REMOVAL N/A 05/31/2022   Procedure: IVC FILTER REMOVAL;  Surgeon: Katha Cabal, MD;  Location: [facility];  Service: Cardiovascular;  Laterality: N/A;   KNEE SURGERY Left    debridement   TONSILLECTOMY     TOTAL HIP ARTHROPLASTY Right 03/22/2022   Procedure: TOTAL HIP ARTHROPLASTY;  Surgeon: Corky Mull, MD;  Location: [facility];  Service: Orthopedics;  Laterality: Right;    OB History   No obstetric history on file.      Home Medications    Prior to  Admission medications   Medication Sig Start Date End Date Taking? Authorizing Provider  acetaminophen (TYLENOL) 500 MG tablet Take 500-1,000 mg by mouth every 6 (six) hours as needed for mild pain or moderate pain.   Yes [provider]  apixaban (ELIQUIS) 5 MG TABS tablet Take by mouth. 05/06/22  Yes [provider]  Calcium Citrate POWD Take 5 mLs by mouth daily.   Yes [provider]  Cholecalciferol (D-3-5) 125 MCG (5000 UT) capsule Take 5,000 Units by mouth daily.   Yes [provider]  cyanocobalamin (,VITAMIN B-12,) 1000 MCG/ML injection Inject 1,000 mcg into the muscle every 30 (thirty) days.   Yes [provider]  docusate sodium (COLACE) 100 MG capsule Take 100 mg by mouth daily.   Yes [provider]  ferrous sulfate 325 (65 FE) MG tablet Take 325 mg by mouth daily with breakfast.   Yes [provider]  mirabegron ER (MYRBETRIQ) 50 MG TB24 tablet Take 1 tablet (50 mg total) by mouth daily. 06/13/22  Yes MacDiarmid, Nicki Reaper, MD  Multiple Vitamins-Minerals (MULTIVITAMIN WITH MINERALS) tablet Take 2 tablets by mouth daily. Gummy   Yes [provider]    Family History Family History  Problem Relation Age of Onset   Breast cancer Mother    Asthma Mother    Lung cancer Father    Heart disease Brother    Heart disease Maternal Grandfather    Throat cancer Maternal Grandfather     Social History Social History   Tobacco Use   Smoking status: Never    Passive exposure: Never   Smokeless tobacco: Never  Vaping Use   Vaping Use: Never used  Substance Use Topics   Alcohol use: No   Drug use: No     Allergies   Codeine, Elemental sulfur, Gabapentin, Levaquin [levofloxacin], Penicillins, Sulfa antibiotics, and Latex   Review of Systems Review of Systems  Constitutional: Negative.   Respiratory: Negative.    Cardiovascular: Negative.   Musculoskeletal:  Positive for arthralgias. Negative for back pain,  gait problem, joint swelling, myalgias, neck pain and neck stiffness.  Skin: Negative.      Physical Exam Triage Vital Signs ED Triage Vitals  Enc Vitals Group     BP 07/10/22 0835 94/60     Pulse Rate 07/10/22 0835 67     Resp 07/10/22 0835 16     Temp 07/10/22 0835 98.8 F (37.1 C)     Temp Source 07/10/22 0835 Oral     SpO2 07/10/22 0835 97 %     Weight 07/10/22 0833 180 lb (81.6 kg)     Height 07/10/22 0833 '5\' 6"'$  (1.676 m)     Head Circumference --      Peak Flow --      Pain Score 07/10/22  5427 8     Pain Loc --      Pain Edu? --      Excl. in [place]? --    No data found.  Updated Vital Signs BP 94/60 (BP Location: Left Arm)   Pulse 67   Temp 98.8 F (37.1 C) (Oral)   Resp 16   Ht '5\' 6"'$  (1.676 m)   Wt 180 lb (81.6 kg)   SpO2 97%   BMI 29.05 kg/m   Visual Acuity Right Eye Distance:   Left Eye Distance:   Bilateral Distance:    Right Eye Near:   Left Eye Near:    Bilateral Near:     Physical Exam Constitutional:      Appearance: Normal appearance.  HENT:     Head: Normocephalic.  Eyes:     Extraocular Movements: Extraocular movements intact.  Pulmonary:     Effort: Pulmonary effort is normal.  Musculoskeletal:     Comments: Point tenderness and mild swelling present over the left epicondyle, mild erythema present, range of motion is intact, 2+ brachial pulse  Neurological:     Mental Status: She is alert and oriented to person, place, and time. Mental status is at baseline.  Psychiatric:        Mood and Affect: Mood normal.        Behavior: Behavior normal.      UC Treatments / Results  Labs (all labs ordered are listed, but only abnormal results are displayed) Labs Reviewed - No data to display  EKG   Radiology No results found.  Procedures Procedures (including critical care time)  Medications Ordered in UC Medications - No data to display  Initial Impression / Assessment and Plan / UC Course  I have reviewed the triage vital  signs and the nursing notes.  Pertinent labs & imaging results that were available during my care of the patient were reviewed by me and considered in my medical decision making (see chart for details).  Left elbow pain  X-ray negative, , etiology could be muscular versus infection however typical infection to the bursa would not progress or rapidly but based on presentation we will provide bacterial coverage, prescribed Keflex and due to blood thinner is unable to use NSAIDs, prescribed topical diclofenac as alternative and may continue use of Tylenol as well as heat, ice and elevation for additional supportive measures, recommended follow-up with PCP in 1 week for reevaluation of symptoms Final Clinical Impressions(s) / UC Diagnoses   Final diagnoses:  None   Discharge Instructions   None    ED Prescriptions   None    PDMP not reviewed this encounter.   Hans Eden, [place] 07/10/22 [phone]

## 2022-07-10 NOTE — ED Triage Notes (Signed)
Pt c/o right elbow pain x1day  Pt states that it started to hurt yesterday and is tender to touch. Pt states that a lump appeared suddenly and is now extremely painful.

## 2022-07-10 NOTE — Discharge Instructions (Addendum)
X-rays negative for injury to the bone or changes to the joint space therefore today we will work to treat her pain  Today we will provide coverage for infection versus pain  Begin Keflex every morning and every evening for 5 days to cover for bacterial infection  You may apply diclofenac gel every 6 hours as needed, this medicine helps to reduce inflammation and in turn will also help with your pain  You may take over-the-counter Tylenol 500 to 1000 mg every 6 hours for additional comfort  You may place ice or heat over the affected area in 10 to 15-minute intervals  May elevate your arm on a pillow while sitting and lying for additional comfort  If your symptoms continue to persist past use of medicine you may follow-up with your primary doctor or urgent care for reevaluation

## 2022-07-13 ENCOUNTER — Other Ambulatory Visit: Payer: Self-pay | Admitting: *Deleted

## 2022-07-13 MED ORDER — APIXABAN 2.5 MG PO TABS: 2.5000 mg | ORAL_TABLET | Freq: Two times a day (BID) | ORAL | 0 refills | Status: DC

## 2022-07-25 ENCOUNTER — Encounter: Payer: Self-pay | Admitting: Oncology

## 2022-07-25 ENCOUNTER — Encounter (INDEPENDENT_AMBULATORY_CARE_PROVIDER_SITE_OTHER): Payer: Self-pay

## 2022-07-25 ENCOUNTER — Ambulatory Visit: Payer: Medicare Other | Admitting: Urology

## 2022-07-25 VITALS — BP 115/68 | HR 71 | Wt 177.0 lb

## 2022-07-25 DIAGNOSIS — R3915 Urgency of urination: Secondary | ICD-10-CM

## 2022-07-25 MED ORDER — MIRABEGRON ER 50 MG PO TB24: 50.0000 mg | ORAL_TABLET | Freq: Every day | ORAL | 11 refills | Status: DC

## 2022-07-25 NOTE — Addendum Note (Signed)
Addended by: Evelina Bucy on: 07/25/2022 03:10 PM   Modules accepted: Orders

## 2022-07-25 NOTE — Progress Notes (Signed)
07/25/2022 2:27 PM   Dana Bautista [birth year] [id number]  Referring provider: Marinda Elk, MD [facility]  [place] [address]  Chief Complaint  Patient presents with   Cysto       HPI: I was consulted to assess the patient's nighttime frequency and nocturia.  It is quite sudden.  She is continent.  She does not have bedwetting.  She gets up 2-3 times a night or more often.  It is significant affecting her sleep.  She has intermittent leg edema.  No diuretic.   She is failed Vesicare and probably oxybutynin.  She could not feel Myrbetriq because insurance never covered it.   Flow is generally reasonable.  Sometimes she has small volume voiding.  She does feel empty.   Has not had a hysterectomy  Patient has nighttime frequency with urgency.  Cost of medication may be an issue.  Pathophysiology of nighttime frequency discussed.  Patient did have a positive urine including microscopic hematuria.  She had a CT stone protocol December 2022.  I sent urine for culture.  She will come back for cystoscopy and I will check her urine again.  If she still has blood in the urine I would recommend repeat CT scan with dye.  Recognizing she may not be able to afford it I want to give her Myrbetriq samples to see if it actually works.   Based on allergies I gave her Macrodantin 100 mg twice a day for 7 days.  I gave her 8 weeks of Myrbetriq samples.  Come back for cystoscopy.  She does think she has an infection.  Over the weekend she was having some discomfort and it felt like a UTI    Today Frequency stable.  Last urine culture positive. Consider nighttime frequency is dramatically better only getting up twice at night etc. hourly. She did report she has had a cystocele and rectocele repair and likely a bladder neck suspension in the past  On pelvic examination it was more challenging to examine her with a thin speculum but I believe she has a small high  cystocele but an apical defect that is quite shiny but I cannot say if she definitely had an enterocele.  With this reduced she had little rectocele.  It was asymptomatic  Cystoscopy: Patient underwent flexible cystoscopy.  Bladder mucosa and trigone was normal.  No cystitis.  No carcinoma.  No large cystocele   PMH: Past Medical History:  Diagnosis Date   Anemia    Arthritis    osteoarthriist   Cancer ([place])    skin   Carotid stenosis, asymptomatic, bilateral    Cellulitis    Complication of anesthesia    nausea   DVT (deep venous thrombosis) ([place]) 09/2019   Heart murmur    History of methicillin resistant staphylococcus aureus (MRSA) 2020   Mitral valve regurgitation    Pericardial effusion    Pleural effusion    PONV (postoperative nausea and vomiting)    Pre-diabetes    Pulmonary embolism ([place]) 2020   Sleep apnea    uses cpap   Tendonitis    outer aspect of right foot   Thyroid nodule    UTI (lower urinary tract infection)    Vitamin B 12 deficiency     Surgical History: Past Surgical History:  Procedure Laterality Date   ABDOMINAL HYSTERECTOMY     APPENDECTOMY     COLONOSCOPY     FRACTURE SURGERY  left wrist   FRACTURE SURGERY Left    wrist   INCISION AND DRAINAGE Right 06/03/2021   Procedure: INCISION AND DRAINAGE-Right Index Finger;  Surgeon: Corky Mull, MD;  Location: [facility];  Service: Orthopedics;  Laterality: Right;   IVC FILTER INSERTION N/A 03/15/2022   Procedure: IVC FILTER INSERTION;  Surgeon: Katha Cabal, MD;  Location: [facility];  Service: Cardiovascular;  Laterality: N/A;   IVC FILTER REMOVAL N/A 05/31/2022   Procedure: IVC FILTER REMOVAL;  Surgeon: Katha Cabal, MD;  Location: [facility];  Service: Cardiovascular;  Laterality: N/A;   KNEE SURGERY Left    debridement   TONSILLECTOMY     TOTAL HIP ARTHROPLASTY Right 03/22/2022   Procedure: TOTAL HIP ARTHROPLASTY;  Surgeon: Corky Mull, MD;  Location: [facility];  Service: Orthopedics;  Laterality: Right;    Home Medications:  Allergies as of 07/25/2022       Reactions   Codeine Nausea And Vomiting   Elemental Sulfur Other (See Comments)   Lip swelling, difficulty breathing   Gabapentin Nausea And Vomiting   Levaquin [levofloxacin] Hives   Penicillins Hives, Other (See Comments)   Did it involve swelling of the face/tongue/throat, SOB, or low BP? Unknown Did it involve sudden or severe rash/hives, skin peeling, or any reaction on the inside of your mouth or nose? Yes Did you need to seek medical attention at a hospital or doctor's office? Yes When did it last happen? many years If all above answers are "NO", may proceed with cephalosporin use.   Sulfa Antibiotics Other (See Comments)   Other reaction(s): lip swelling   Latex Rash   Peeling skin        Medication List        Accurate as of July 25, 2022  2:27 PM. If you have any questions, ask your nurse or doctor.          acetaminophen 500 MG tablet Commonly known as: TYLENOL Take 500-1,000 mg by mouth every 6 (six) hours as needed for mild pain or moderate pain.   apixaban 2.5 MG Tabs tablet Commonly known as: ELIQUIS Take 1 tablet (2.5 mg total) by mouth 2 (two) times daily.   Calcium Citrate Powd Take 5 mLs by mouth daily.   cyanocobalamin 1000 MCG/ML injection Commonly known as: VITAMIN B12 Inject 1,000 mcg into the muscle every 30 (thirty) days.   D-3-5 125 MCG (5000 UT) capsule Generic drug: Cholecalciferol Take 5,000 Units by mouth daily.   diclofenac Sodium 1 % Gel Commonly known as: VOLTAREN Apply 2 g topically 4 (four) times daily.   docusate sodium 100 MG capsule Commonly known as: COLACE Take 100 mg by mouth daily.   ferrous sulfate 325 (65 FE) MG tablet Take 325 mg by mouth daily with breakfast.   mirabegron ER 50 MG Tb24 tablet Commonly known as: MYRBETRIQ Take 1 tablet (50 mg total) by mouth daily.   multivitamin with minerals  tablet Take 2 tablets by mouth daily. Gummy        Allergies:  Allergies  Allergen Reactions   Codeine Nausea And Vomiting   Elemental Sulfur Other (See Comments)    Lip swelling, difficulty breathing   Gabapentin Nausea And Vomiting   Levaquin [Levofloxacin] Hives   Penicillins Hives and Other (See Comments)    Did it involve swelling of the face/tongue/throat, SOB, or low BP? Unknown Did it involve sudden or severe rash/hives, skin peeling, or any reaction on the  inside of your mouth or nose? Yes Did you need to seek medical attention at a hospital or doctor's office? Yes When did it last happen? many years If all above answers are "NO", may proceed with cephalosporin use.    Sulfa Antibiotics Other (See Comments)    Other reaction(s): lip swelling   Latex Rash    Peeling skin    Family History: Family History  Problem Relation Age of Onset   Breast cancer Mother    Asthma Mother    Lung cancer Father    Heart disease Brother    Heart disease Maternal Grandfather    Throat cancer Maternal Grandfather     Social History:  reports that she has never smoked. She has never been exposed to tobacco smoke. She has never used smokeless tobacco. She reports that she does not drink alcohol and does not use drugs.  ROS:                                        Physical Exam: There were no vitals taken for this visit.  Constitutional:  Alert and oriented, No acute distress. HEENT: [place] AT, moist mucus membranes.  Trachea midline, no masses.   Laboratory Data: Lab Results  Component Value Date   WBC 5.8 06/20/2022   HGB 10.4 (L) 06/20/2022   HCT 31.8 (L) 06/20/2022   MCV 86.4 06/20/2022   PLT 260 06/20/2022    Lab Results  Component Value Date   CREATININE 0.65 06/20/2022    No results found for: "PSA"  No results found for: "TESTOSTERONE"  No results found for: "HGBA1C"  Urinalysis    Component Value Date/Time   COLORURINE STRAW (A)  03/15/2022 0828   APPEARANCEUR Cloudy (A) 06/13/2022 0935   LABSPEC 1.003 (L) 03/15/2022 0828   PHURINE 5.0 03/15/2022 0828   GLUCOSEU Negative 06/13/2022 0935   HGBUR NEGATIVE 03/15/2022 0828   BILIRUBINUR Negative 06/13/2022 0935   KETONESUR 5 (A) 03/15/2022 0828   PROTEINUR 1+ (A) 06/13/2022 0935   PROTEINUR NEGATIVE 03/15/2022 0828   NITRITE Positive (A) 06/13/2022 0935   NITRITE NEGATIVE 03/15/2022 0828   LEUKOCYTESUR 3+ (A) 06/13/2022 0935   LEUKOCYTESUR NEGATIVE 03/15/2022 0828    Pertinent Imaging:   Assessment & Plan: Reviewed urine sent for culture.  Patient has improved nighttime frequency with sterile urine on Myrbetriq.  Reevaluate in about 4 months.  I may or may not have her stop the Myrbetriq in the future since the UTI may have been the culprit.  She is asymptomatic prolapse  1. Urinary urgency  - Urinalysis, Complete   No follow-ups on file.  Reece Packer, MD  [facility] [address] [place], [place] [address] [phone]

## 2022-07-26 LAB — URINALYSIS, COMPLETE
Bilirubin, UA: NEGATIVE
Glucose, UA: NEGATIVE
Ketones, UA: NEGATIVE
Nitrite, UA: NEGATIVE
Protein,UA: NEGATIVE
RBC, UA: NEGATIVE
Specific Gravity, UA: 1.015 (ref 1.005–1.030)
Urobilinogen, Ur: 0.2 mg/dL (ref 0.2–1.0)
pH, UA: 5 (ref 5.0–7.5)

## 2022-07-26 LAB — MICROSCOPIC EXAMINATION

## 2022-07-27 ENCOUNTER — Encounter: Payer: Self-pay | Admitting: Oncology

## 2022-07-28 LAB — CULTURE, URINE COMPREHENSIVE

## 2022-08-15 ENCOUNTER — Encounter: Payer: Self-pay | Admitting: Oncology

## 2022-08-30 ENCOUNTER — Emergency Department
Admission: EM | Admit: 2022-08-30 | Discharge: 2022-08-30 | Discharge status: Against Medical Advice | Payer: Medicare Other | Attending: Emergency Medicine | Admitting: Emergency Medicine

## 2022-08-30 ENCOUNTER — Other Ambulatory Visit: Payer: Self-pay

## 2022-08-30 ENCOUNTER — Encounter: Payer: Self-pay | Admitting: Emergency Medicine

## 2022-08-30 ENCOUNTER — Emergency Department: Payer: Medicare Other

## 2022-08-30 DIAGNOSIS — F419 Anxiety disorder, unspecified: Secondary | ICD-10-CM | POA: Diagnosis not present

## 2022-08-30 DIAGNOSIS — R079 Chest pain, unspecified: Secondary | ICD-10-CM | POA: Insufficient documentation

## 2022-08-30 DIAGNOSIS — Z5321 Procedure and treatment not carried out due to patient leaving prior to being seen by health care provider: Secondary | ICD-10-CM | POA: Insufficient documentation

## 2022-08-30 LAB — URINALYSIS, ROUTINE W REFLEX MICROSCOPIC
Bilirubin Urine: NEGATIVE
Glucose, UA: NEGATIVE mg/dL
Hgb urine dipstick: NEGATIVE
Ketones, ur: 20 mg/dL — AB
Nitrite: POSITIVE — AB
Protein, ur: NEGATIVE mg/dL
Specific Gravity, Urine: 1.014 (ref 1.005–1.030)
pH: 6 (ref 5.0–8.0)

## 2022-08-30 LAB — CBC
HCT: 33.3 % — ABNORMAL LOW (ref 36.0–46.0)
Hemoglobin: 11 g/dL — ABNORMAL LOW (ref 12.0–15.0)
MCH: 27.8 pg (ref 26.0–34.0)
MCHC: 33 g/dL (ref 30.0–36.0)
MCV: 84.3 fL (ref 80.0–100.0)
Platelets: 310 10*3/uL (ref 150–400)
RBC: 3.95 MIL/uL (ref 3.87–5.11)
RDW: 13.8 % (ref 11.5–15.5)
WBC: 8.6 10*3/uL (ref 4.0–10.5)
nRBC: 0 % (ref 0.0–0.2)

## 2022-08-30 LAB — BASIC METABOLIC PANEL
Anion gap: 16 — ABNORMAL HIGH (ref 5–15)
BUN: 13 mg/dL (ref 8–23)
CO2: 18 mmol/L — ABNORMAL LOW (ref 22–32)
Calcium: 9.6 mg/dL (ref 8.9–10.3)
Chloride: 97 mmol/L — ABNORMAL LOW (ref 98–111)
Creatinine, Ser: 0.85 mg/dL (ref 0.44–1.00)
GFR, Estimated: >60 mL/min (ref >60)
Glucose, Bld: 114 mg/dL — ABNORMAL HIGH (ref 70–99)
Potassium: 3.5 mmol/L (ref 3.5–5.1)
Sodium: 131 mmol/L — ABNORMAL LOW (ref 135–145)

## 2022-08-30 LAB — TROPONIN I (HIGH SENSITIVITY): Troponin I (High Sensitivity): 3 ng/L (ref <18)

## 2022-08-30 MED ORDER — LORAZEPAM 0.5 MG PO TABS
0.5000 mg | ORAL_TABLET | Freq: Once | ORAL | Status: AC
  Administered 2022-08-30: 0.5 mg via ORAL
  Filled 2022-08-30: qty 1

## 2022-08-30 MED ORDER — ASPIRIN 81 MG PO CHEW
324.0000 mg | CHEWABLE_TABLET | Freq: Once | ORAL | Status: AC
  Administered 2022-08-30: 324 mg via ORAL
  Filled 2022-08-30: qty 4

## 2022-08-30 NOTE — ED Triage Notes (Signed)
Patient arrives in wheelchair by POV c/o left sided chest pain into back onset about 2pm. Patient states her apple watch read a-fib. Denies any hx of a-fib. Patient states day after thanksgiving she had a fall causing her to fall on her back. Patient very anxious in triage.

## 2022-08-30 NOTE — ED Provider Triage Note (Signed)
Emergency Medicine Provider Triage Evaluation Note  Dana Bautista , a 76 y.o. female  was evaluated in triage.  Pt complains of chest pain, not feeling well, anxiety.  States her Apple Watch was seen she is in A-fib.  Review of Systems  Positive:  Negative:   Physical Exam  BP (!) 164/80 (BP Location: Left Arm)   Pulse (!) 106   Temp 97.7 F (36.5 C) (Oral)   Resp (!) 22   Ht '5\' 6"'$  (1.676 m)   Wt 76.7 kg   SpO2 100%   BMI 27.28 kg/m  Gen:   Awake, no distress   Resp:  Normal effort  MSK:   Moves extremities without difficulty  Other:  Appears to be very anxious  Medical Decision Making  Medically screening exam initiated at 2:51 PM.  Appropriate orders placed.  Margree Gimbel was informed that the remainder of the evaluation will be completed by another provider, this initial triage assessment does not replace that evaluation, and the importance of remaining in the ED until their evaluation is complete.  Patient is on Eliquis.  However we did go ahead and give her aspirin due to the ongoing chest pain.  Also gave her Ativan 0.5 mg p.o. while here in triage.   Versie Starks, PA-C 08/30/22 1451

## 2022-08-30 NOTE — ED Notes (Signed)
Patient notified staff of leave. Patient stated she no longer wanted to stay. Ambulatory upon leaving with no signs of distress.

## 2022-09-08 ENCOUNTER — Ambulatory Visit (INDEPENDENT_AMBULATORY_CARE_PROVIDER_SITE_OTHER): Payer: Medicare Other | Admitting: Nurse Practitioner

## 2022-09-08 VITALS — BP 112/69 | HR 69 | Resp 16 | Ht 66.0 in | Wt 168.0 lb

## 2022-09-08 DIAGNOSIS — I8311 Varicose veins of right lower extremity with inflammation: Secondary | ICD-10-CM | POA: Diagnosis not present

## 2022-09-08 DIAGNOSIS — I8312 Varicose veins of left lower extremity with inflammation: Secondary | ICD-10-CM

## 2022-09-13 ENCOUNTER — Encounter (INDEPENDENT_AMBULATORY_CARE_PROVIDER_SITE_OTHER): Payer: Self-pay | Admitting: Nurse Practitioner

## 2022-09-13 NOTE — Progress Notes (Signed)
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   Procedure: Sclerotherapy using hypertonic saline mixed with 1% Lidocaine was performed on the bilateral lower extremity. Compression wraps were placed. The patient tolerated the procedure well. 

## 2022-09-28 ENCOUNTER — Other Ambulatory Visit: Payer: Self-pay | Admitting: Oncology

## 2022-10-06 ENCOUNTER — Encounter (INDEPENDENT_AMBULATORY_CARE_PROVIDER_SITE_OTHER): Payer: Self-pay | Admitting: Nurse Practitioner

## 2022-10-06 ENCOUNTER — Ambulatory Visit (INDEPENDENT_AMBULATORY_CARE_PROVIDER_SITE_OTHER): Payer: Medicare Other | Admitting: Nurse Practitioner

## 2022-10-06 VITALS — BP 102/75 | HR 72 | Resp 16 | Wt 167.8 lb

## 2022-10-06 DIAGNOSIS — I8311 Varicose veins of right lower extremity with inflammation: Secondary | ICD-10-CM | POA: Diagnosis not present

## 2022-10-06 DIAGNOSIS — I8312 Varicose veins of left lower extremity with inflammation: Secondary | ICD-10-CM | POA: Diagnosis not present

## 2022-10-17 ENCOUNTER — Encounter (INDEPENDENT_AMBULATORY_CARE_PROVIDER_SITE_OTHER): Payer: Self-pay | Admitting: Nurse Practitioner

## 2022-10-17 NOTE — Progress Notes (Signed)
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   Procedure: Sclerotherapy using hypertonic saline mixed with 1% Lidocaine was performed on the bilateral lower extremity. Compression wraps were placed. The patient tolerated the procedure well. 

## 2022-10-21 ENCOUNTER — Inpatient Hospital Stay: Payer: Medicare Other | Attending: Oncology

## 2022-10-21 DIAGNOSIS — Z86718 Personal history of other venous thrombosis and embolism: Secondary | ICD-10-CM | POA: Diagnosis not present

## 2022-10-21 DIAGNOSIS — D509 Iron deficiency anemia, unspecified: Secondary | ICD-10-CM | POA: Diagnosis present

## 2022-10-21 DIAGNOSIS — D508 Other iron deficiency anemias: Secondary | ICD-10-CM

## 2022-10-21 DIAGNOSIS — Z86711 Personal history of pulmonary embolism: Secondary | ICD-10-CM | POA: Diagnosis not present

## 2022-10-21 DIAGNOSIS — Z7901 Long term (current) use of anticoagulants: Secondary | ICD-10-CM | POA: Diagnosis not present

## 2022-10-21 LAB — CBC WITH DIFFERENTIAL/PLATELET
Abs Immature Granulocytes: 0.01 10*3/uL (ref 0.00–0.07)
Basophils Absolute: 0 10*3/uL (ref 0.0–0.1)
Basophils Relative: 0 %
Eosinophils Absolute: 0.1 10*3/uL (ref 0.0–0.5)
Eosinophils Relative: 2 %
HCT: 33.8 % — ABNORMAL LOW (ref 36.0–46.0)
Hemoglobin: 10.9 g/dL — ABNORMAL LOW (ref 12.0–15.0)
Immature Granulocytes: 0 %
Lymphocytes Relative: 33 %
Lymphs Abs: 1.7 10*3/uL (ref 0.7–4.0)
MCH: 28.2 pg (ref 26.0–34.0)
MCHC: 32.2 g/dL (ref 30.0–36.0)
MCV: 87.3 fL (ref 80.0–100.0)
Monocytes Absolute: 0.5 10*3/uL (ref 0.1–1.0)
Monocytes Relative: 10 %
Neutro Abs: 2.8 10*3/uL (ref 1.7–7.7)
Neutrophils Relative %: 55 %
Platelets: 276 10*3/uL (ref 150–400)
RBC: 3.87 MIL/uL (ref 3.87–5.11)
RDW: 14.2 % (ref 11.5–15.5)
WBC: 5.1 10*3/uL (ref 4.0–10.5)
nRBC: 0 % (ref 0.0–0.2)

## 2022-10-21 LAB — IRON AND TIBC
Iron: 70 ug/dL (ref 28–170)
Saturation Ratios: 20 % (ref 10.4–31.8)
TIBC: 346 ug/dL (ref 250–450)
UIBC: 276 ug/dL

## 2022-10-21 LAB — FERRITIN: Ferritin: 15 ng/mL (ref 11–307)

## 2022-10-24 ENCOUNTER — Inpatient Hospital Stay (HOSPITAL_BASED_OUTPATIENT_CLINIC_OR_DEPARTMENT_OTHER): Payer: Medicare Other | Admitting: Oncology

## 2022-10-24 ENCOUNTER — Inpatient Hospital Stay: Payer: Medicare Other

## 2022-10-24 ENCOUNTER — Encounter: Payer: Self-pay | Admitting: Oncology

## 2022-10-24 VITALS — BP 98/58 | HR 80

## 2022-10-24 VITALS — BP 106/67 | HR 85 | Temp 98.1°F | Resp 16 | Ht 66.0 in | Wt 166.0 lb

## 2022-10-24 DIAGNOSIS — Z86711 Personal history of pulmonary embolism: Secondary | ICD-10-CM | POA: Diagnosis not present

## 2022-10-24 DIAGNOSIS — D508 Other iron deficiency anemias: Secondary | ICD-10-CM | POA: Diagnosis not present

## 2022-10-24 DIAGNOSIS — D509 Iron deficiency anemia, unspecified: Secondary | ICD-10-CM | POA: Diagnosis not present

## 2022-10-24 DIAGNOSIS — Z86718 Personal history of other venous thrombosis and embolism: Secondary | ICD-10-CM | POA: Diagnosis not present

## 2022-10-24 MED ORDER — SODIUM CHLORIDE 0.9 % IV SOLN
200.0000 mg | Freq: Once | INTRAVENOUS | Status: AC
  Administered 2022-10-24: 200 mg via INTRAVENOUS
  Filled 2022-10-24: qty 200

## 2022-10-24 MED ORDER — SODIUM CHLORIDE 0.9 % IV SOLN
Freq: Once | INTRAVENOUS | Status: AC
  Filled 2022-10-24: qty 250

## 2022-10-24 NOTE — Assessment & Plan Note (Signed)
Labs are reviewed and discussed with patient.  Continue Eliquis 2.5 mg twice daily.  Stable hemoglobin.

## 2022-10-24 NOTE — Assessment & Plan Note (Addendum)
Hemoglobin is slightly decreased, likely due to blood loss form her hip surgery.  Labs are reviewed and discussed with patient. Ferritin 15, borderline.  Recommend IV Venofer weekly x 3 to further improve iron store.

## 2022-10-24 NOTE — Progress Notes (Signed)
Chronic Hematology/Oncology Progress note [phone] [phone]      Patient Care Team: Marinda Elk, MD as PCP - General (Physician Assistant) Earlie Server, MD as Consulting Physician (Oncology)  ASSESSMENT & PLAN:   History of deep vein thrombosis (DVT) of lower extremity Labs are reviewed and discussed with patient.  Continue Eliquis 2.5 mg twice daily.  Stable hemoglobin.  IDA (iron deficiency anemia) Hemoglobin is slightly decreased, likely due to blood loss form her hip surgery.  Labs are reviewed and discussed with patient. Ferritin 15, borderline.  Recommend IV Venofer weekly x 3 to further improve iron store.   Orders Placed This Encounter  Procedures   CBC with Differential/Platelet    Standing Status:   Future    Standing Expiration Date:   10/25/2023   Iron and TIBC    Standing Status:   Future    Standing Expiration Date:   10/25/2023   Ferritin    Standing Status:   Future    Standing Expiration Date:   10/25/2023   Follow-up in 3 months. All questions were answered. The patient knows to call the clinic with any problems, questions or concerns.  Earlie Server, MD, PhD [facility] Hematology Oncology 10/24/2022   CHIEF COMPLAINTS/REASON FOR VISIT:  Follow up for pulmonary  embolism, anemia.   HISTORY OF PRESENTING ILLNESS:   Dana Bautista is a  77 y.o.  female with PMH listed below was seen in consultation at the request of  Marinda Elk, MD  for evaluation of pulmonary embolism  Patient was admitted from 10/04/2019-10/05/2019 due to acute bilateral pulmonary embolism and DVT.  Patient denies any immobilization factors prior to the events.  Her initial symptom was sudden onset of left-sided chest pain. 10/04/2019, CT chest angiogram showed acute bilateral lower lobe pulmonary embolism.  No right heart strain.  Small left pleural effusion. 10/05/2019 bilateral lower extremity ultrasound showed right calf posterior calf occlusive DVT.  Very  low thrombus burden.  Patient was started on anticoagulation and discharged on Eliquis. She has been on anticoagulation for slightly more than 6 months. She was referred to hematology for evaluation management. Patient reports that the left chest pain has completely resolved.  She was less active since the diagnosis of pulmonary embolism and has been deconditioned.  She has tried to exercise more and her exercise endurance has improved. Intermittently she experienced left lower extremity swelling. She denies any constitutional symptoms.  July 2021 -switched to Eliquis 2.'5mg'$  BID  INTERVAL HISTORY Dana Bautista is a 77 y.o. female who has above history reviewed by me today presents for follow up visit for management of history of thrombosis.  Patient has been on Eliquis 2.5 mg twice daily.  No bleeding events. No new complains today. She gets monthly B12 injections at her PCP's office.   Review of Systems  Constitutional:  Negative for appetite change, chills, fatigue and fever.  HENT:   Negative for hearing loss and voice change.   Eyes:  Negative for eye problems.  Respiratory:  Negative for chest tightness and cough.   Cardiovascular:  Negative for chest pain.  Gastrointestinal:  Negative for abdominal distention, abdominal pain and blood in stool.  Endocrine: Negative for hot flashes.  Genitourinary:  Negative for difficulty urinating and frequency.   Musculoskeletal:  Negative for arthralgias.  Skin:  Negative for itching and rash.  Neurological:  Negative for extremity weakness.  Hematological:  Negative for adenopathy.  Psychiatric/Behavioral:  Negative for confusion.  MEDICAL HISTORY:  Past Medical History:  Diagnosis Date   Anemia    Arthritis    osteoarthriist   Cancer ([place])    skin   Carotid stenosis, asymptomatic, bilateral    Cellulitis    Complication of anesthesia    nausea   DVT (deep venous thrombosis) ([place]) 09/2019   Heart murmur    History of methicillin  resistant staphylococcus aureus (MRSA) 2020   Mitral valve regurgitation    Pericardial effusion    Pleural effusion    PONV (postoperative nausea and vomiting)    Pre-diabetes    Pulmonary embolism ([place]) 2020   Sleep apnea    uses cpap   Tendonitis    outer aspect of right foot   Thyroid nodule    UTI (lower urinary tract infection)    Vitamin B 12 deficiency     SURGICAL HISTORY: Past Surgical History:  Procedure Laterality Date   ABDOMINAL HYSTERECTOMY     APPENDECTOMY     COLONOSCOPY     FRACTURE SURGERY     left wrist   FRACTURE SURGERY Left    wrist   INCISION AND DRAINAGE Right 06/03/2021   Procedure: INCISION AND DRAINAGE-Right Index Finger;  Surgeon: Corky Mull, MD;  Location: [facility] OR[facility];  Service: Orthopedics;  Laterality: Right;   IVC FILTER INSERTION N/A 03/15/2022   Procedure: IVC FILTER INSERTION;  Surgeon: Katha Cabal, MD;  Location: [place] [facility];  Service: Cardiovascular;  Laterality: N/A;   IVC FILTER REMOVAL N/A 05/31/2022   Procedure: IVC FILTER REMOVAL;  Surgeon: Katha Cabal, MD;  Location: [place] [facility];  Service: Cardiovascular;  Laterality: N/A;   KNEE SURGERY Left    debridement   TONSILLECTOMY     TOTAL HIP ARTHROPLASTY Right 03/22/2022   Procedure: TOTAL HIP ARTHROPLASTY;  Surgeon: Corky Mull, MD;  Location: [facility];  Service: Orthopedics;  Laterality: Right;    SOCIAL HISTORY: Social History   Socioeconomic History   Marital status: Married    Spouse name: Not on file   Number of children: Not on file   Years of education: Not on file   Highest education level: Not on file  Occupational History   Not on file  Tobacco Use   Smoking status: Never    Passive exposure: Never   Smokeless tobacco: Never  Vaping Use   Vaping Use: Never used  Substance and Sexual Activity   Alcohol use: No   Drug use: No   Sexual activity: Not on file  Other Topics Concern   Not on file  Social History Narrative   Not on  file   Social Determinants of Health   Financial Resource Strain: Not on file  Food Insecurity: Not on file  Transportation Needs: Not on file  Physical Activity: Not on file  Stress: Not on file  Social Connections: Not on file  Intimate Partner Violence: Not on file    FAMILY HISTORY: Family History  Problem Relation Age of Onset   Breast cancer Mother    Asthma Mother    Lung cancer Father    Heart disease Brother    Heart disease Maternal Grandfather    Throat cancer Maternal Grandfather     ALLERGIES:  is allergic to codeine, elemental sulfur, gabapentin, levaquin [levofloxacin], penicillins, sulfa antibiotics, and latex.  MEDICATIONS:  Current Outpatient Medications  Medication Sig Dispense Refill   acetaminophen (TYLENOL) 500 MG tablet Take 500-1,000 mg by mouth every 6 (six) hours  as needed for mild pain or moderate pain.     apixaban (ELIQUIS) 2.5 MG TABS tablet TAKE 1 TABLET TWICE A DAY 180 tablet 1   Ascorbic Acid (VITAMIN C) 100 MG tablet Take 100 mg by mouth daily.     Calcium Citrate POWD Take 5 mLs by mouth daily.     Cholecalciferol (D-3-5) 125 MCG (5000 UT) capsule Take 5,000 Units by mouth daily.     cyanocobalamin (,VITAMIN B-12,) 1000 MCG/ML injection Inject 1,000 mcg into the muscle every 30 (thirty) days.     docusate sodium (COLACE) 100 MG capsule Take 100 mg by mouth daily.     ferrous sulfate 325 (65 FE) MG tablet Take 325 mg by mouth daily with breakfast.     mirabegron ER (MYRBETRIQ) 50 MG TB24 tablet Take 1 tablet (50 mg total) by mouth daily. 30 tablet 0   mirabegron ER (MYRBETRIQ) 50 MG TB24 tablet Take 1 tablet (50 mg total) by mouth daily. 30 tablet 11   Multiple Vitamins-Minerals (MULTIVITAMIN WITH MINERALS) tablet Take 2 tablets by mouth daily. Gummy     potassium chloride (KLOR-CON M) 10 MEQ tablet Take 10 mEq by mouth 2 (two) times daily.     tiZANidine (ZANAFLEX) 2 MG tablet Take 2 mg by mouth 3 (three) times daily.     diclofenac Sodium  (VOLTAREN) 1 % GEL Apply 2 g topically 4 (four) times daily. (Patient not taking: Reported on 07/25/2022) 50 g 0   No current facility-administered medications for this visit.     PHYSICAL EXAMINATION: ECOG PERFORMANCE STATUS: 1 - Symptomatic but completely ambulatory Vitals:   10/24/22 1348  BP: 106/67  Pulse: 85  Resp: 16  Temp: 98.1 F (36.7 C)  SpO2: 99%   Filed Weights   10/24/22 1348  Weight: 166 lb (75.3 kg)    Physical Exam Constitutional:      General: She is not in acute distress. HENT:     Head: Normocephalic and atraumatic.  Eyes:     General: No scleral icterus. Cardiovascular:     Rate and Rhythm: Normal rate and regular rhythm.     Heart sounds: Normal heart sounds.  Pulmonary:     Effort: Pulmonary effort is normal. No respiratory distress.     Breath sounds: No wheezing.  Abdominal:     General: Bowel sounds are normal. There is no distension.     Palpations: Abdomen is soft.  Musculoskeletal:        General: No deformity. Normal range of motion.     Cervical back: Normal range of motion and neck supple.     Comments: Trace edema, bilateral lower extremities.  varicose vein bilaterally.   Skin:    General: Skin is warm and dry.     Findings: No erythema or rash.  Neurological:     Mental Status: She is alert and oriented to person, place, and time. Mental status is at baseline.     Cranial Nerves: No cranial nerve deficit.     Coordination: Coordination normal.  Psychiatric:        Mood and Affect: Mood normal.     LABORATORY DATA:  I have reviewed the data as listed Lab Results  Component Value Date   WBC 5.1 10/21/2022   HGB 10.9 (L) 10/21/2022   HCT 33.8 (L) 10/21/2022   MCV 87.3 10/21/2022   PLT 276 10/21/2022   Recent Labs    12/13/21 1004 03/23/22 0347 06/20/22 1055 08/30/22 1430  NA 136  132* 137 131*  K 3.9 3.9 4.2 3.5  CL 102 103 109 97*  CO2 '27 24 23 '$ 18*  GLUCOSE 97 113* 134* 114*  BUN 16 6* 22 13  CREATININE 0.63  0.43* 0.65 0.85  CALCIUM 9.1 7.8* 9.0 9.6  GFRNONAA >60 >60 >60 >60  PROT 7.0  --  6.6  --   ALBUMIN 3.9  --  3.7  --   AST 14*  --  20  --   ALT 10  --  9  --   ALKPHOS 56  --  60  --   BILITOT 0.7  --  0.7  --     Iron/TIBC/Ferritin/ %Sat    Component Value Date/Time   IRON 70 10/21/2022 0907   TIBC 346 10/21/2022 0907   FERRITIN 15 10/21/2022 0907   IRONPCTSAT 20 10/21/2022 0907    hypercoagulable state work up.  Negative prothrombin gene mutation, Factor V leiden mutation.  Negative anticardiolipin IgM, indeterminate level of anticardiolipin IgG, not meeting antiphospholipid syndrome diagnosis criteria.   RADIOGRAPHIC STUDIES: I have personally reviewed the radiological images as listed and agreed with the findings in the report. No results found.

## 2022-10-26 ENCOUNTER — Encounter: Payer: Self-pay | Admitting: Oncology

## 2022-10-31 ENCOUNTER — Inpatient Hospital Stay: Payer: TRICARE For Life (TFL)

## 2022-11-01 ENCOUNTER — Inpatient Hospital Stay: Payer: Medicare Other | Attending: Oncology

## 2022-11-01 VITALS — BP 103/60 | HR 69 | Temp 96.7°F | Resp 18

## 2022-11-01 DIAGNOSIS — Z86718 Personal history of other venous thrombosis and embolism: Secondary | ICD-10-CM | POA: Insufficient documentation

## 2022-11-01 DIAGNOSIS — Z7901 Long term (current) use of anticoagulants: Secondary | ICD-10-CM | POA: Diagnosis not present

## 2022-11-01 DIAGNOSIS — D509 Iron deficiency anemia, unspecified: Secondary | ICD-10-CM | POA: Insufficient documentation

## 2022-11-01 DIAGNOSIS — D508 Other iron deficiency anemias: Secondary | ICD-10-CM

## 2022-11-01 MED ORDER — SODIUM CHLORIDE 0.9 % IV SOLN
Freq: Once | INTRAVENOUS | Status: AC
  Filled 2022-11-01: qty 250

## 2022-11-01 MED ORDER — SODIUM CHLORIDE 0.9 % IV SOLN
200.0000 mg | Freq: Once | INTRAVENOUS | Status: AC
  Administered 2022-11-01: 200 mg via INTRAVENOUS
  Filled 2022-11-01: qty 200

## 2022-11-01 NOTE — Progress Notes (Signed)
Pt has been educated and understands. Pt refused to stay 30 mins after iron infusion. VSS. Husband driving.

## 2022-11-03 ENCOUNTER — Ambulatory Visit (INDEPENDENT_AMBULATORY_CARE_PROVIDER_SITE_OTHER): Payer: Medicare Other | Admitting: Nurse Practitioner

## 2022-11-07 ENCOUNTER — Inpatient Hospital Stay: Payer: Medicare Other

## 2022-11-07 VITALS — BP 100/56 | HR 63 | Temp 97.3°F | Resp 16

## 2022-11-07 DIAGNOSIS — D508 Other iron deficiency anemias: Secondary | ICD-10-CM

## 2022-11-07 DIAGNOSIS — D509 Iron deficiency anemia, unspecified: Secondary | ICD-10-CM | POA: Diagnosis not present

## 2022-11-07 MED ORDER — SODIUM CHLORIDE 0.9 % IV SOLN
Freq: Once | INTRAVENOUS | Status: AC
  Filled 2022-11-07: qty 250

## 2022-11-07 MED ORDER — SODIUM CHLORIDE 0.9 % IV SOLN
200.0000 mg | Freq: Once | INTRAVENOUS | Status: AC
  Administered 2022-11-07: 200 mg via INTRAVENOUS
  Filled 2022-11-07: qty 200

## 2022-11-07 NOTE — Patient Instructions (Signed)

## 2022-11-15 ENCOUNTER — Ambulatory Visit (INDEPENDENT_AMBULATORY_CARE_PROVIDER_SITE_OTHER): Payer: Medicare Other | Admitting: Nurse Practitioner

## 2022-11-28 ENCOUNTER — Ambulatory Visit: Payer: Medicare Other | Admitting: Urology

## 2022-11-29 ENCOUNTER — Encounter: Payer: Self-pay | Admitting: Oncology

## 2022-11-29 ENCOUNTER — Ambulatory Visit (INDEPENDENT_AMBULATORY_CARE_PROVIDER_SITE_OTHER): Payer: Medicare Other | Admitting: Nurse Practitioner

## 2022-11-29 ENCOUNTER — Encounter (INDEPENDENT_AMBULATORY_CARE_PROVIDER_SITE_OTHER): Payer: Self-pay | Admitting: Nurse Practitioner

## 2022-11-29 VITALS — BP 107/58 | HR 61 | Resp 16 | Wt 169.0 lb

## 2022-11-29 DIAGNOSIS — I8312 Varicose veins of left lower extremity with inflammation: Secondary | ICD-10-CM

## 2022-11-29 DIAGNOSIS — I8311 Varicose veins of right lower extremity with inflammation: Secondary | ICD-10-CM

## 2022-11-29 NOTE — Progress Notes (Signed)
Varicose veins of bilateral lower extremity with inflammation (454.1  I83.10) Current Plans   Indication: Patient presents with symptomatic varicose veins of the bilateral  lower extremity.   Procedure: Sclerotherapy using hypertonic saline mixed with 1% Lidocaine was performed on the bilateral lower extremity. Compression wraps were placed. The patient tolerated the procedure well. 

## 2022-12-05 ENCOUNTER — Telehealth (INDEPENDENT_AMBULATORY_CARE_PROVIDER_SITE_OTHER): Payer: Self-pay

## 2022-12-05 NOTE — Telephone Encounter (Signed)
Because the veins were slightly larger behind her knee she may be having more inflammation and irritation of the vein.  It is highly unlikely that it is a DVT as I am not injecting the deep veins and the patient is on eliquis.  I would advise warm compresses.  She can come in for a DVT only study, if it is negative for any dvt or superficial phlebitis, she should continue with conservative therapies or she can see her PCP to determine if the issue is with the knee itself

## 2022-12-05 NOTE — Telephone Encounter (Signed)
Patient notified with medical advice and has been schedule for dvt ultrasound

## 2022-12-05 NOTE — Telephone Encounter (Signed)
Patient left a message stating she started having pain behind the right knee and calf. Patient informed the pain started day after sclerotherapy on 11/29/22. Patient has taking tylenol,elevated and rested her leg. Patient has no redness or inflammation to the area. Please Advise

## 2022-12-06 ENCOUNTER — Other Ambulatory Visit (INDEPENDENT_AMBULATORY_CARE_PROVIDER_SITE_OTHER): Payer: Self-pay | Admitting: Nurse Practitioner

## 2022-12-06 DIAGNOSIS — M79604 Pain in right leg: Secondary | ICD-10-CM

## 2022-12-07 ENCOUNTER — Ambulatory Visit (INDEPENDENT_AMBULATORY_CARE_PROVIDER_SITE_OTHER): Payer: Medicare Other

## 2022-12-07 DIAGNOSIS — M79604 Pain in right leg: Secondary | ICD-10-CM | POA: Diagnosis not present

## 2022-12-08 ENCOUNTER — Other Ambulatory Visit: Payer: Self-pay | Admitting: Family Medicine

## 2022-12-08 DIAGNOSIS — M5416 Radiculopathy, lumbar region: Secondary | ICD-10-CM

## 2022-12-12 ENCOUNTER — Ambulatory Visit
Admission: RE | Admit: 2022-12-12 | Discharge: 2022-12-12 | Disposition: A | Discharge status: Home / Self Care | Payer: Medicare Other | Source: Ambulatory Visit | Attending: Family Medicine | Admitting: Family Medicine

## 2022-12-12 DIAGNOSIS — M5416 Radiculopathy, lumbar region: Secondary | ICD-10-CM

## 2022-12-16 ENCOUNTER — Other Ambulatory Visit: Payer: Self-pay | Admitting: Physician Assistant

## 2022-12-16 DIAGNOSIS — N1330 Unspecified hydronephrosis: Secondary | ICD-10-CM

## 2022-12-19 ENCOUNTER — Other Ambulatory Visit: Payer: Self-pay

## 2022-12-19 ENCOUNTER — Telehealth: Payer: Self-pay

## 2022-12-19 DIAGNOSIS — R7303 Prediabetes: Secondary | ICD-10-CM

## 2022-12-19 DIAGNOSIS — R0789 Other chest pain: Secondary | ICD-10-CM

## 2022-12-19 NOTE — Telephone Encounter (Signed)
Received request from [facility] to addon: CMP, lipid panel, TSH, Hgb A1c, UA. Lab orders entered.

## 2022-12-26 ENCOUNTER — Encounter: Payer: Self-pay | Admitting: Oncology

## 2022-12-26 ENCOUNTER — Ambulatory Visit (INDEPENDENT_AMBULATORY_CARE_PROVIDER_SITE_OTHER): Payer: Medicare Other | Admitting: Urology

## 2022-12-26 VITALS — BP 111/72 | HR 64 | Ht 66.0 in | Wt 169.0 lb

## 2022-12-26 DIAGNOSIS — R3915 Urgency of urination: Secondary | ICD-10-CM

## 2022-12-26 DIAGNOSIS — R351 Nocturia: Secondary | ICD-10-CM

## 2022-12-26 DIAGNOSIS — N1330 Unspecified hydronephrosis: Secondary | ICD-10-CM

## 2022-12-26 LAB — MICROSCOPIC EXAMINATION: WBC, UA: >30 /hpf — AB (ref 0–5)

## 2022-12-26 LAB — URINALYSIS, COMPLETE
Bilirubin, UA: NEGATIVE
Glucose, UA: NEGATIVE
Ketones, UA: NEGATIVE
Nitrite, UA: NEGATIVE
RBC, UA: NEGATIVE
Specific Gravity, UA: 1.015 (ref 1.005–1.030)
Urobilinogen, Ur: 0.2 mg/dL (ref 0.2–1.0)
pH, UA: 8.5 — ABNORMAL HIGH (ref 5.0–7.5)

## 2022-12-26 NOTE — Progress Notes (Signed)
12/26/2022 9:11 AM   Dana Bautista 11-04-1945 [id number]  Referring provider: Marinda Elk, MD [place] [facility]  [place] [address]  Chief Complaint  Patient presents with   Follow-up    HPI: I was consulted to assess the patient's nighttime frequency and nocturia.  It is quite sudden.  She is continent.  She does not have bedwetting.  She gets up 2-3 times a night or more often.  It is significant affecting her sleep.  She has intermittent leg edema.  No diuretic.   She is failed Vesicare and probably oxybutynin.  She could not fill the Myrbetriq because insurance never covered it.   Flow is generally reasonable.  Sometimes she has small volume voiding.  She does feel empty.   Has not had a hysterectomy   Patient has nighttime frequency with urgency.  Cost of medication may be an issue.  Pathophysiology of nighttime frequency discussed.  Patient did have a positive urine including microscopic hematuria.  She had a CT stone protocol December 2022.  I sent urine for culture.  She will come back for cystoscopy and I will check her urine again.  If she still has blood in the urine I would recommend repeat CT scan with dye.  Recognizing she may not be able to afford it I want to give her Myrbetriq samples to see if it actually works.   Based on allergies I gave her Macrodantin 100 mg twice a day for 7 days.  I gave her 8 weeks of Myrbetriq samples.  Come back for cystoscopy.  She does think she has an infection.  Over the weekend she was having some discomfort and it felt like a UTI     Today Frequency stable.  Last urine culture positive. Consider nighttime frequency is dramatically better only getting up twice at night etc. hourly. She did report she has had a cystocele and rectocele repair and likely a bladder neck suspension in the past   On pelvic examination it was more challenging to examine her with a thin speculum but I believe she has a small  high cystocele but an apical defect that is quite shiny but I cannot say if she definitely had an enterocele.  With this reduced she had little rectocele.  It was asymptomatic   Cystoscopy: Patient underwent flexible cystoscopy.  Bladder mucosa and trigone was normal.  No cystitis.  No carcinoma.  No large cystocele    Patient has improved nighttime frequency with sterile urine on Myrbetriq. Reevaluate in about 4 months. I may or may not have her stop the Myrbetriq in the future since the UTI may have been the culprit. She is asymptomatic prolapse   Today Patient is currently off the Myrbetriq and was not clear on why she stopped it.  She gets up twice now.  We talked about watchful waiting versus medication.  Clinically not infected.  Prolapse stable and asymptomatic  Patient had a recent MRI at [facility] and then a renal ultrasound that showed mild hydronephrosis.  I saw the report but I could not pull up their clinic notes and plan.  There was a mention of getting a CT scan.  She said they were just going to follow it   PMH: Past Medical History:  Diagnosis Date   Anemia    Arthritis    osteoarthriist   Cancer ([place])    skin   Carotid stenosis, asymptomatic, bilateral    Cellulitis  Complication of anesthesia    nausea   DVT (deep venous thrombosis) ([facility]) 09/2019   Heart murmur    History of methicillin resistant staphylococcus aureus (MRSA) 2020   Mitral valve regurgitation    Pericardial effusion    Pleural effusion    PONV (postoperative nausea and vomiting)    Pre-diabetes    Pulmonary embolism ([place]) 2020   Sleep apnea    uses cpap   Tendonitis    outer aspect of right foot   Thyroid nodule    UTI (lower urinary tract infection)    Vitamin B 12 deficiency     Surgical History: Past Surgical History:  Procedure Laterality Date   ABDOMINAL HYSTERECTOMY     APPENDECTOMY     COLONOSCOPY     FRACTURE SURGERY     left wrist   FRACTURE SURGERY Left    wrist   INCISION  AND DRAINAGE Right 06/03/2021   Procedure: INCISION AND DRAINAGE-Right Index Finger;  Surgeon: Corky Mull, MD;  Location: [facility];  Service: Orthopedics;  Laterality: Right;   IVC FILTER INSERTION N/A 03/15/2022   Procedure: IVC FILTER INSERTION;  Surgeon: Katha Cabal, MD;  Location: [place] [facility];  Service: Cardiovascular;  Laterality: N/A;   IVC FILTER REMOVAL N/A 05/31/2022   Procedure: IVC FILTER REMOVAL;  Surgeon: Katha Cabal, MD;  Location: [place] [facility];  Service: Cardiovascular;  Laterality: N/A;   KNEE SURGERY Left    debridement   TONSILLECTOMY     TOTAL HIP ARTHROPLASTY Right 03/22/2022   Procedure: TOTAL HIP ARTHROPLASTY;  Surgeon: Corky Mull, MD;  Location: [facility];  Service: Orthopedics;  Laterality: Right;    Home Medications:  Allergies as of 12/26/2022       Reactions   Codeine Nausea And Vomiting   Elemental Sulfur Other (See Comments)   Lip swelling, difficulty breathing   Gabapentin Nausea And Vomiting   Levaquin [levofloxacin] Hives   Penicillins Hives, Other (See Comments)   Did it involve swelling of the face/tongue/throat, SOB, or low BP? Unknown Did it involve sudden or severe rash/hives, skin peeling, or any reaction on the inside of your mouth or nose? Yes Did you need to seek medical attention at a hospital or doctor's office? Yes When did it last happen? many years If all above answers are "NO", may proceed with cephalosporin use.   Sulfa Antibiotics Other (See Comments)   Other reaction(s): lip swelling   Latex Rash   Peeling skin   Sulfur Other (See Comments), Swelling   Lip swelling    Lip swelling, difficulty breathing        Medication List        Accurate as of December 26, 2022  9:11 AM. If you have any questions, ask your nurse or doctor.          acetaminophen 500 MG tablet Commonly known as: TYLENOL Take 500-1,000 mg by mouth every 6 (six) hours as needed for mild pain or moderate pain.   Calcium  Citrate Powd Take 5 mLs by mouth daily.   cyanocobalamin 1000 MCG/ML injection Commonly known as: VITAMIN B12 Inject 1,000 mcg into the muscle every 30 (thirty) days.   D-3-5 125 MCG (5000 UT) capsule Generic drug: Cholecalciferol Take 5,000 Units by mouth daily.   diclofenac Sodium 1 % Gel Commonly known as: VOLTAREN Apply 2 g topically 4 (four) times daily.   docusate sodium 100 MG capsule Commonly known as: COLACE Take 100 mg  by mouth daily.   Eliquis 2.5 MG Tabs tablet Generic drug: apixaban TAKE 1 TABLET TWICE A DAY   ferrous sulfate 325 (65 FE) MG tablet Take 325 mg by mouth daily with breakfast.   mirabegron ER 50 MG Tb24 tablet Commonly known as: MYRBETRIQ Take 1 tablet (50 mg total) by mouth daily.   mirabegron ER 50 MG Tb24 tablet Commonly known as: MYRBETRIQ Take 1 tablet (50 mg total) by mouth daily.   multivitamin with minerals tablet Take 2 tablets by mouth daily. Gummy   potassium chloride 10 MEQ tablet Commonly known as: KLOR-CON M Take 10 mEq by mouth 2 (two) times daily.   tiZANidine 2 MG tablet Commonly known as: ZANAFLEX Take 2 mg by mouth 3 (three) times daily.   vitamin C 100 MG tablet Take 100 mg by mouth daily.        Allergies:  Allergies  Allergen Reactions   Codeine Nausea And Vomiting   Elemental Sulfur Other (See Comments)    Lip swelling, difficulty breathing   Gabapentin Nausea And Vomiting   Levaquin [Levofloxacin] Hives   Penicillins Hives and Other (See Comments)    Did it involve swelling of the face/tongue/throat, SOB, or low BP? Unknown Did it involve sudden or severe rash/hives, skin peeling, or any reaction on the inside of your mouth or nose? Yes Did you need to seek medical attention at a hospital or doctor's office? Yes When did it last happen? many years If all above answers are "NO", may proceed with cephalosporin use.    Sulfa Antibiotics Other (See Comments)    Other reaction(s): lip swelling   Latex  Rash    Peeling skin   Sulfur Other (See Comments) and Swelling    Lip swelling    Lip swelling, difficulty breathing    Family History: Family History  Problem Relation Age of Onset   Breast cancer Mother    Asthma Mother    Lung cancer Father    Heart disease Brother    Heart disease Maternal Grandfather    Throat cancer Maternal Grandfather     Social History:  reports that she has never smoked. She has never been exposed to tobacco smoke. She has never used smokeless tobacco. She reports that she does not drink alcohol and does not use drugs.  ROS:                                        Physical Exam: BP 111/72   Pulse 64   Ht 5\' 6"  (1.676 m)   Wt 76.7 kg   BMI 27.28 kg/m   Constitutional:  Alert and oriented, No acute distress. HEENT: [place] AT, moist mucus membranes.  Trachea midline, no masses.  Laboratory Data: Lab Results  Component Value Date   WBC 5.1 10/21/2022   HGB 10.9 (L) 10/21/2022   HCT 33.8 (L) 10/21/2022   MCV 87.3 10/21/2022   PLT 276 10/21/2022    Lab Results  Component Value Date   CREATININE 0.85 08/30/2022    No results found for: "PSA"  No results found for: "TESTOSTERONE"  No results found for: "HGBA1C"  Urinalysis    Component Value Date/Time   COLORURINE AMBER (A) 08/30/2022 1500   APPEARANCEUR CLEAR (A) 08/30/2022 1500   APPEARANCEUR Clear 07/25/2022 1427   LABSPEC 1.014 08/30/2022 1500   PHURINE 6.0 08/30/2022 1500   GLUCOSEU NEGATIVE 08/30/2022  [place] 08/30/2022 1500   BILIRUBINUR NEGATIVE 08/30/2022 1500   BILIRUBINUR Negative 07/25/2022 1427   KETONESUR 20 (A) 08/30/2022 1500   PROTEINUR NEGATIVE 08/30/2022 1500   NITRITE POSITIVE (A) 08/30/2022 1500   LEUKOCYTESUR MODERATE (A) 08/30/2022 1500    Pertinent Imaging:   Assessment & Plan: Patient chose watchful waiting for milder nighttime symptoms and may go back on the Myrbetriq in the future if things worsen.  Call if urine  culture positive.  Picture drawn.  See nurse practitioner in the next 2 weeks with CT scan of abdomen and pelvis with and without contrast to make certain there is no pathology.  Patient has obstruction of the ureter she likely will see one of my partners.  Otherwise I will see her as needed  1. Urinary urgency  - Urinalysis, Complete   No follow-ups on file.  Reece Packer, MD  [place] [address], [place], [place] [address] [phone]

## 2022-12-27 ENCOUNTER — Ambulatory Visit (INDEPENDENT_AMBULATORY_CARE_PROVIDER_SITE_OTHER): Payer: Medicare Other | Admitting: Nurse Practitioner

## 2022-12-27 ENCOUNTER — Encounter: Payer: Self-pay | Admitting: Oncology

## 2022-12-28 DIAGNOSIS — Z86718 Personal history of other venous thrombosis and embolism: Secondary | ICD-10-CM | POA: Insufficient documentation

## 2022-12-28 NOTE — Progress Notes (Signed)
MRN : [id number]  Dana Bautista is a 77 y.o. ([birth year]) female who presents with chief complaint of varicose veins hurt.  History of Present Illness:   The patient returns for followup evaluation with c/o new larger varicose veins that are painful to the touch. The patient continues to have pain in the lower extremities with dependency and on a daily bases. The pain is lessened with elevation. Graduated compression stockings, Class I (20-30 mmHg), have been worn but the stockings do not eliminate the leg pain. Over-the-counter analgesics do not improve the symptoms. The degree of discomfort continues to interfere with daily activities. The patient notes the pain in the legs is causing problems with daily exercise, at the workplace and even with household activities and maintenance such as standing in the kitchen preparing meals and doing dishes.   She has had multiple rounds of sclerotherapy in the past.  Her great saphenous veins have been ablated in the past and follow up studies have show the ablation is successful.  Venous ultrasound dated December 19, 2022 shows normal right deep venous system, no evidence of acute or chronic DVT.  Superficial reflux is not present in the right lower extremity.  A bilateral venous duplex ultrasound dated July 18, 2022 is negative for deep vein thrombosis bilaterally there was no evidence of superficial reflux or superficial thrombophlebitis.  Both the right and left great saphenous veins appear to have been ablated remotely (patient estimates approximately 20 years ago)  No outpatient medications have been marked as taking for the 12/29/22 encounter (Appointment) with Gilda Crease, Latina Craver, MD.    Past Medical History:  Diagnosis Date   Anemia    Arthritis    osteoarthriist   Cancer (HCC)    skin   Carotid stenosis, asymptomatic, bilateral    Cellulitis    Complication of anesthesia    nausea   DVT (deep venous thrombosis) (HCC) 09/2019   Heart  murmur    History of methicillin resistant staphylococcus aureus (MRSA) 2020   Mitral valve regurgitation    Pericardial effusion    Pleural effusion    PONV (postoperative nausea and vomiting)    Pre-diabetes    Pulmonary embolism (HCC) 2020   Sleep apnea    uses cpap   Tendonitis    outer aspect of right foot   Thyroid nodule    UTI (lower urinary tract infection)    Vitamin B 12 deficiency     Past Surgical History:  Procedure Laterality Date   ABDOMINAL HYSTERECTOMY     APPENDECTOMY     COLONOSCOPY     FRACTURE SURGERY     left wrist   FRACTURE SURGERY Left    wrist   INCISION AND DRAINAGE Right 06/03/2021   Procedure: INCISION AND DRAINAGE-Right Index Finger;  Surgeon: Christena Flake, MD;  Location: [facility];  Service: Orthopedics;  Laterality: Right;   IVC FILTER INSERTION N/A 03/15/2022   Procedure: IVC FILTER INSERTION;  Surgeon: Renford Dills, MD;  Location: [facility];  Service: Cardiovascular;  Laterality: N/A;   IVC FILTER REMOVAL N/A 05/31/2022   Procedure: IVC FILTER REMOVAL;  Surgeon: Renford Dills, MD;  Location: [facility];  Service: Cardiovascular;  Laterality: N/A;   KNEE SURGERY Left    debridement   TONSILLECTOMY     TOTAL HIP ARTHROPLASTY Right 03/22/2022   Procedure: TOTAL HIP ARTHROPLASTY;  Surgeon: Christena Flake, MD;  Location: [facility];  Service: Orthopedics;  Laterality: Right;    Social History Social History   Tobacco Use   Smoking status: Never    Passive exposure: Never   Smokeless tobacco: Never  Vaping Use   Vaping Use: Never used  Substance Use Topics   Alcohol use: No   Drug use: No    Family History Family History  Problem Relation Age of Onset   Breast cancer Mother    Asthma Mother    Lung cancer Father    Heart disease Brother    Heart disease Maternal Grandfather    Throat cancer Maternal Grandfather     Allergies  Allergen Reactions   Codeine Nausea And Vomiting   Elemental Sulfur  Other (See Comments)    Lip swelling, difficulty breathing   Gabapentin Nausea And Vomiting   Levaquin [Levofloxacin] Hives   Penicillins Hives and Other (See Comments)    Did it involve swelling of the face/tongue/throat, SOB, or low BP? Unknown Did it involve sudden or severe rash/hives, skin peeling, or any reaction on the inside of your mouth or nose? Yes Did you need to seek medical attention at a hospital or doctor's office? Yes When did it last happen? many years If all above answers are "NO", may proceed with cephalosporin use.    Sulfa Antibiotics Other (See Comments)    Other reaction(s): lip swelling   Latex Rash    Peeling skin   Sulfur Other (See Comments) and Swelling    Lip swelling    Lip swelling, difficulty breathing     REVIEW OF SYSTEMS (Negative unless checked)  Constitutional: [] Weight loss  [] Fever  [] Chills Cardiac: [] Chest pain   [] Chest pressure   [] Palpitations   [] Shortness of breath when laying flat   [] Shortness of breath with exertion. Vascular:  [] Pain in legs with walking   [x] Pain in legs with standing  [] History of DVT   [] Phlebitis   [] Swelling in legs   [x] Varicose veins   [] Non-healing ulcers Pulmonary:   [] Uses home oxygen   [] Productive cough   [] Hemoptysis   [] Wheeze  [] COPD   [] Asthma Neurologic:  [] Dizziness   [] Seizures   [] History of stroke   [] History of TIA  [] Aphasia   [] Vissual changes   [] Weakness or numbness in arm   [] Weakness or numbness in leg Musculoskeletal:   [] Joint swelling   [] Joint pain   [] Low back pain Hematologic:  [] Easy bruising  [] Easy bleeding   [] Hypercoagulable state   [] Anemic Gastrointestinal:  [] Diarrhea   [] Vomiting  [] Gastroesophageal reflux/heartburn   [] Difficulty swallowing. Genitourinary:  [] Chronic kidney disease   [] Difficult urination  [] Frequent urination   [] Blood in urine Skin:  [] Rashes   [] Ulcers  Psychological:  [] History of anxiety   []  History of major depression.  Physical  Examination  There were no vitals filed for this visit. There is no height or weight on file to calculate BMI. Gen: WD/WN, NAD Head: [place]/AT, No temporalis wasting.  Ear/Nose/Throat: Hearing grossly intact, nares w/o erythema or drainage, pinna without lesions Eyes: PER, EOMI, sclera nonicteric.  Neck: Supple, no gross masses.  No JVD.  Pulmonary:  Good air movement, no audible wheezing, no use of accessory muscles.  Cardiac: RRR, precordium not hyperdynamic. Vascular:  Large varicosities present, greater than 10 mm right leg.  Veins are tender to palpation  Mild venous stasis changes to the legs bilaterally.  Trace soft pitting edema CEAP C3sEpAsPr Vessel Right Left  Radial Palpable Palpable  Gastrointestinal: soft, non-distended. No guarding/no peritoneal signs.  Musculoskeletal: M/S 5/5 throughout.  No deformity.  Neurologic: CN 2-12 intact. Pain and light touch intact in extremities.  Symmetrical.  Speech is fluent. Motor exam as listed above. Psychiatric: Judgment intact, Mood & affect appropriate for pt's clinical situation. Dermatologic: Venous rashes no ulcers noted.  No changes consistent with cellulitis. Lymph : No lichenification or skin changes of chronic lymphedema.  CBC Lab Results  Component Value Date   WBC 5.1 10/21/2022   HGB 10.9 (L) 10/21/2022   HCT 33.8 (L) 10/21/2022   MCV 87.3 10/21/2022   PLT 276 10/21/2022    BMET    Component Value Date/Time   NA 131 (L) 08/30/2022 1430   K 3.5 08/30/2022 1430   CL 97 (L) 08/30/2022 1430   CO2 18 (L) 08/30/2022 1430   GLUCOSE 114 (H) 08/30/2022 1430   BUN 13 08/30/2022 1430   CREATININE 0.85 08/30/2022 1430   CALCIUM 9.6 08/30/2022 1430   GFRNONAA >60 08/30/2022 1430   GFRAA >60 05/01/2020 1102   CrCl cannot be calculated (Patient's most recent lab result is older than the maximum 21 days allowed.).  COAG Lab Results  Component Value Date   INR 1.1 10/05/2019   INR 1.0 10/04/2019    Radiology VAS US LOWER  EXTREMITY VENOUS (DVT)  Result Date: 12/19/2022  Lower Venous DVT Study Patient Name:  Terrill MohrSARA Henner    Date of Exam:   12/07/2022 Medical Rec #: [id number]   Accession #:    [id number] Date of Birth: October 12, 1945  Patient Gender: F Patient Age:   [age] years Exam Location:  [facility] Procedure:      VAS US LOWER EXTREMITY VENOUS (DVT) Referring Phys: Sheppard PlumberFALLON BROWN --------------------------------------------------------------------------------  Indications: Pain, and behind right knee s/p sclerotherapy.  Performing Technologist: Salvadore Farbererry Knight RVT  Examination Guidelines: A complete evaluation includes B-mode imaging, spectral Doppler, color Doppler, and power Doppler as needed of all accessible portions of each vessel. Bilateral testing is considered an integral part of a complete examination. Limited examinations for reoccurring indications may be performed as noted. The reflux portion of the exam is performed with the patient in reverse Trendelenburg.  +---------+---------------+---------+-----------+----------+--------------+ RIGHT    CompressibilityPhasicitySpontaneityPropertiesThrombus Aging +---------+---------------+---------+-----------+----------+--------------+ CFV      Full           Yes      Yes                                 +---------+---------------+---------+-----------+----------+--------------+ SFJ      Full                                                        +---------+---------------+---------+-----------+----------+--------------+ FV Prox  Full                                                        +---------+---------------+---------+-----------+----------+--------------+ FV Mid   Full           Yes      Yes                                 +---------+---------------+---------+-----------+----------+--------------+  FV DistalFull                                                         +---------+---------------+---------+-----------+----------+--------------+ PFV      Full           Yes      Yes                                 +---------+---------------+---------+-----------+----------+--------------+ POP      Full           Yes      Yes                                 +---------+---------------+---------+-----------+----------+--------------+ PTV      Full           Yes      Yes                                 +---------+---------------+---------+-----------+----------+--------------+ PERO     Full           Yes      Yes                                 +---------+---------------+---------+-----------+----------+--------------+ Gastroc  Full           Yes      Yes                                 +---------+---------------+---------+-----------+----------+--------------+ SSV      Full           Yes      Yes                                 +---------+---------------+---------+-----------+----------+--------------+     Summary: RIGHT: - No evidence of deep vein thrombosis in the lower extremity. No indirect evidence of obstruction proximal to the inguinal ligament. - Right GSV closed by ablation many years ago   *See table(s) above for measurements and observations. Electronically signed by [name] MD on 12/19/2022 at 4:17:25 PM.    Final    MR LUMBAR SPINE WO CONTRAST  Result Date: 12/12/2022 CLINICAL DATA:  Back and leg pain with numbness and weakness on and off for 2-3 weeks. No known injury. EXAM: MRI LUMBAR SPINE WITHOUT CONTRAST TECHNIQUE: Multiplanar, multisequence MR imaging of the lumbar spine was performed. No intravenous contrast was administered. COMPARISON:  None Available. FINDINGS: Segmentation:  Standard. Alignment:  Physiologic. Vertebrae:  No fracture, evidence of discitis, or bone lesion. Conus medullaris and cauda equina: Conus extends to the L2 level. Conus and cauda equina appear normal. Paraspinal and other soft tissues:  Left hydronephrosis not seen on renal stone CT 08/27/2021. Disc levels: T12- L1: Small central disc protrusion. L1-L2: Unremarkable. L2-L3: Unremarkable. L3-L4: Unremarkable. L4-L5: Mild disc bulging with posterior annular fissure. Mild facet spurring. L5-S1:Small central protrusion at an annular fissure. IMPRESSION: 1. 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 2. Left hydronephrosis from uncertain cause. Electronically Signed   By: Tiburcio Pea M.D.   On: 12/12/2022 10:51     Assessment/Plan 1. Varicose veins with pain Recommend:  The patient has had successful ablation of the previously incompetent saphenous venous system but still has persistent symptoms of pain and swelling that are having a negative impact on daily life and daily activities.  Patient should undergo injection sclerotherapy to treat the residual varicosities.  The risks, benefits and alternative therapies were reviewed in detail with the patient.  All questions were answered.  The patient agrees to proceed with sclerotherapy at their convenience.  The patient will continue wearing the graduated compression stockings and using the over-the-counter pain medications to treat her symptoms.   2. History of pulmonary embolism Recommend:   No surgery or intervention at this point in time.  IVC filter is not indicated at present.  The patient is on anticoagulation, Eliquis 2.5mg  bid.   Elevation was stressed, such as the use of a recliner.  I have discussed  DVT and post phlebitic changes such as swelling and why it  causes symptoms such as pain.  The patient should wear graduated compression stockings beginning after three full days of anticoagulation.  The compression should be worn on a daily basis. The patient should wearing the stockings first thing in the morning and removing them in the evening. The patient should not to sleep in the stockings.  In addition, behavioral modification  including elevation during the day and avoidance of prolonged dependency will be initiated.    The patient will continue anticoagulation for now as there have not been any problems or complications at this point.   3. Primary osteoarthritis of right knee Continue NSAID medications as already ordered, these medications have been reviewed and there are no changes at this time.  Continued activity and therapy was stressed.     [name], MD  12/28/2022 12:17 PM

## 2022-12-29 ENCOUNTER — Encounter: Payer: Self-pay | Admitting: Oncology

## 2022-12-29 ENCOUNTER — Ambulatory Visit (INDEPENDENT_AMBULATORY_CARE_PROVIDER_SITE_OTHER): Payer: Medicare Other | Admitting: Vascular Surgery

## 2022-12-29 ENCOUNTER — Encounter (INDEPENDENT_AMBULATORY_CARE_PROVIDER_SITE_OTHER): Payer: Self-pay | Admitting: Vascular Surgery

## 2022-12-29 VITALS — BP 91/54 | HR 64 | Resp 16 | Wt 170.0 lb

## 2022-12-29 DIAGNOSIS — I83819 Varicose veins of unspecified lower extremities with pain: Secondary | ICD-10-CM | POA: Diagnosis not present

## 2022-12-29 DIAGNOSIS — M1711 Unilateral primary osteoarthritis, right knee: Secondary | ICD-10-CM

## 2022-12-29 DIAGNOSIS — Z86711 Personal history of pulmonary embolism: Secondary | ICD-10-CM

## 2022-12-29 DIAGNOSIS — Z86718 Personal history of other venous thrombosis and embolism: Secondary | ICD-10-CM

## 2022-12-29 LAB — CULTURE, URINE COMPREHENSIVE

## 2023-01-01 ENCOUNTER — Encounter (INDEPENDENT_AMBULATORY_CARE_PROVIDER_SITE_OTHER): Payer: Self-pay | Admitting: Vascular Surgery

## 2023-01-01 DIAGNOSIS — I83819 Varicose veins of unspecified lower extremities with pain: Secondary | ICD-10-CM | POA: Insufficient documentation

## 2023-01-25 ENCOUNTER — Inpatient Hospital Stay: Payer: Medicare Other | Attending: Oncology

## 2023-01-25 DIAGNOSIS — Z7901 Long term (current) use of anticoagulants: Secondary | ICD-10-CM | POA: Insufficient documentation

## 2023-01-25 DIAGNOSIS — Z87440 Personal history of urinary (tract) infections: Secondary | ICD-10-CM | POA: Insufficient documentation

## 2023-01-25 DIAGNOSIS — D509 Iron deficiency anemia, unspecified: Secondary | ICD-10-CM | POA: Diagnosis present

## 2023-01-25 DIAGNOSIS — Z86718 Personal history of other venous thrombosis and embolism: Secondary | ICD-10-CM | POA: Insufficient documentation

## 2023-01-25 DIAGNOSIS — Z90710 Acquired absence of both cervix and uterus: Secondary | ICD-10-CM | POA: Insufficient documentation

## 2023-01-25 DIAGNOSIS — D508 Other iron deficiency anemias: Secondary | ICD-10-CM

## 2023-01-25 DIAGNOSIS — R7303 Prediabetes: Secondary | ICD-10-CM

## 2023-01-25 DIAGNOSIS — Z79899 Other long term (current) drug therapy: Secondary | ICD-10-CM | POA: Diagnosis not present

## 2023-01-25 DIAGNOSIS — Z801 Family history of malignant neoplasm of trachea, bronchus and lung: Secondary | ICD-10-CM | POA: Diagnosis not present

## 2023-01-25 DIAGNOSIS — R109 Unspecified abdominal pain: Secondary | ICD-10-CM | POA: Diagnosis not present

## 2023-01-25 DIAGNOSIS — Z86711 Personal history of pulmonary embolism: Secondary | ICD-10-CM | POA: Diagnosis not present

## 2023-01-25 DIAGNOSIS — Z8614 Personal history of Methicillin resistant Staphylococcus aureus infection: Secondary | ICD-10-CM | POA: Diagnosis not present

## 2023-01-25 DIAGNOSIS — R3915 Urgency of urination: Secondary | ICD-10-CM | POA: Diagnosis not present

## 2023-01-25 DIAGNOSIS — Z803 Family history of malignant neoplasm of breast: Secondary | ICD-10-CM | POA: Diagnosis not present

## 2023-01-25 DIAGNOSIS — Z800 Family history of malignant neoplasm of digestive organs: Secondary | ICD-10-CM | POA: Diagnosis not present

## 2023-01-25 DIAGNOSIS — R0789 Other chest pain: Secondary | ICD-10-CM

## 2023-01-25 LAB — CMP (CANCER CENTER ONLY)
ALT: 12 U/L (ref 0–44)
AST: 15 U/L (ref 15–41)
Albumin: 3.9 g/dL (ref 3.5–5.0)
Alkaline Phosphatase: 59 U/L (ref 38–126)
Anion gap: 6 (ref 5–15)
BUN: 17 mg/dL (ref 8–23)
CO2: 25 mmol/L (ref 22–32)
Calcium: 8.8 mg/dL — ABNORMAL LOW (ref 8.9–10.3)
Chloride: 102 mmol/L (ref 98–111)
Creatinine: 0.58 mg/dL (ref 0.44–1.00)
GFR, Estimated: >60 mL/min (ref >60)
Glucose, Bld: 93 mg/dL (ref 70–99)
Potassium: 3.7 mmol/L (ref 3.5–5.1)
Sodium: 133 mmol/L — ABNORMAL LOW (ref 135–145)
Total Bilirubin: 0.9 mg/dL (ref 0.3–1.2)
Total Protein: 6.7 g/dL (ref 6.5–8.1)

## 2023-01-25 LAB — CBC WITH DIFFERENTIAL/PLATELET
Abs Immature Granulocytes: 0.01 10*3/uL (ref 0.00–0.07)
Basophils Absolute: 0 10*3/uL (ref 0.0–0.1)
Basophils Relative: 0 %
Eosinophils Absolute: 0.1 10*3/uL (ref 0.0–0.5)
Eosinophils Relative: 1 %
HCT: 35.4 % — ABNORMAL LOW (ref 36.0–46.0)
Hemoglobin: 11.4 g/dL — ABNORMAL LOW (ref 12.0–15.0)
Immature Granulocytes: 0 %
Lymphocytes Relative: 31 %
Lymphs Abs: 1.8 10*3/uL (ref 0.7–4.0)
MCH: 28.5 pg (ref 26.0–34.0)
MCHC: 32.2 g/dL (ref 30.0–36.0)
MCV: 88.5 fL (ref 80.0–100.0)
Monocytes Absolute: 0.4 10*3/uL (ref 0.1–1.0)
Monocytes Relative: 7 %
Neutro Abs: 3.5 10*3/uL (ref 1.7–7.7)
Neutrophils Relative %: 61 %
Platelets: 241 10*3/uL (ref 150–400)
RBC: 4 MIL/uL (ref 3.87–5.11)
RDW: 13.8 % (ref 11.5–15.5)
WBC: 5.7 10*3/uL (ref 4.0–10.5)
nRBC: 0 % (ref 0.0–0.2)

## 2023-01-25 LAB — LIPID PANEL
Cholesterol: 201 mg/dL — ABNORMAL HIGH (ref 0–200)
HDL: 71 mg/dL (ref >40)
LDL Cholesterol: 117 mg/dL — ABNORMAL HIGH (ref 0–99)
Total CHOL/HDL Ratio: 2.8 RATIO
Triglycerides: 64 mg/dL (ref <150)
VLDL: 13 mg/dL (ref 0–40)

## 2023-01-25 LAB — HEMOGLOBIN A1C
Hgb A1c MFr Bld: 5.7 % — ABNORMAL HIGH (ref 4.8–5.6)
Mean Plasma Glucose: 116.89 mg/dL

## 2023-01-25 LAB — URINALYSIS, COMPLETE (UACMP) WITH MICROSCOPIC
Bacteria, UA: NONE SEEN
Bilirubin Urine: NEGATIVE
Glucose, UA: NEGATIVE mg/dL
Hgb urine dipstick: NEGATIVE
Ketones, ur: NEGATIVE mg/dL
Nitrite: NEGATIVE
Protein, ur: NEGATIVE mg/dL
Specific Gravity, Urine: 1.013 (ref 1.005–1.030)
pH: 5 (ref 5.0–8.0)

## 2023-01-25 LAB — IRON AND TIBC
Iron: 106 ug/dL (ref 28–170)
Saturation Ratios: 36 % — ABNORMAL HIGH (ref 10.4–31.8)
TIBC: 297 ug/dL (ref 250–450)
UIBC: 191 ug/dL

## 2023-01-25 LAB — FERRITIN: Ferritin: 70 ng/mL (ref 11–307)

## 2023-01-25 LAB — TSH: TSH: 0.855 u[IU]/mL (ref 0.350–4.500)

## 2023-01-26 ENCOUNTER — Inpatient Hospital Stay: Payer: Medicare Other

## 2023-01-30 ENCOUNTER — Ambulatory Visit: Payer: Medicare Other | Admitting: Physician Assistant

## 2023-02-01 ENCOUNTER — Inpatient Hospital Stay: Payer: Medicare Other | Admitting: Oncology

## 2023-02-01 ENCOUNTER — Inpatient Hospital Stay: Payer: Medicare Other

## 2023-02-10 ENCOUNTER — Encounter: Payer: Self-pay | Admitting: Oncology

## 2023-02-14 ENCOUNTER — Encounter: Payer: Self-pay | Admitting: Oncology

## 2023-02-15 ENCOUNTER — Ambulatory Visit
Admission: RE | Admit: 2023-02-15 | Discharge: 2023-02-15 | Disposition: A | Discharge status: Home / Self Care | Payer: Medicare Other | Source: Ambulatory Visit | Attending: Urology | Admitting: Urology

## 2023-02-15 DIAGNOSIS — N1330 Unspecified hydronephrosis: Secondary | ICD-10-CM | POA: Diagnosis present

## 2023-02-15 MED ORDER — IOHEXOL 300 MG/ML  SOLN
100.0000 mL | Freq: Once | INTRAMUSCULAR | Status: AC | PRN
  Administered 2023-02-15: 100 mL via INTRAVENOUS

## 2023-02-17 ENCOUNTER — Encounter: Payer: Self-pay | Admitting: Physician Assistant

## 2023-02-17 ENCOUNTER — Ambulatory Visit (INDEPENDENT_AMBULATORY_CARE_PROVIDER_SITE_OTHER): Payer: Medicare Other | Admitting: Physician Assistant

## 2023-02-17 ENCOUNTER — Encounter: Payer: Self-pay | Admitting: Oncology

## 2023-02-17 VITALS — BP 99/63 | HR 63 | Ht 66.0 in | Wt 170.0 lb

## 2023-02-17 DIAGNOSIS — N130 Hydronephrosis with ureteropelvic junction obstruction: Secondary | ICD-10-CM

## 2023-02-17 DIAGNOSIS — Q6211 Congenital occlusion of ureteropelvic junction: Secondary | ICD-10-CM

## 2023-02-17 MED FILL — Iron Sucrose Inj 20 MG/ML (Fe Equiv): INTRAVENOUS | Qty: 10 | Status: AC

## 2023-02-17 NOTE — Progress Notes (Signed)
02/17/2023 10:51 AM   Dana Bautista 1946-05-19 [id number]  CC: Chief Complaint  Patient presents with   Follow-up    1 month follow up, review CT results   HPI: Dana Bautista is a 77 y.o. female with PMH nocturia previously on Myrbetriq, recurrent UTI, and POP who presents today for CT results after renal ultrasound at [facility] showed mild hydronephrosis.   CTAP with and without contrast dated 02/15/2023 revealed minimal left calyectasis without hydroureter, felt consistent with normal variation or mild UPJ obstruction.  On review of prior CT stone study dated 08/27/2021, she did have some similar-appearing fullness of the left renal collecting system.  Her creatinine has been stable around 0.5-0.6.  Today she reports occasional soreness in her back, however this is not limited to the left flank and she tends to assume it is musculoskeletal in origin.  She has not noticed any correlation between this and her hydration status.  PMH: Past Medical History:  Diagnosis Date   Anemia    Arthritis    osteoarthriist   Cancer (HCC)    skin   Carotid stenosis, asymptomatic, bilateral    Cellulitis    Complication of anesthesia    nausea   DVT (deep venous thrombosis) (HCC) 09/2019   Heart murmur    History of methicillin resistant staphylococcus aureus (MRSA) 2020   Mitral valve regurgitation    Pericardial effusion    Pleural effusion    PONV (postoperative nausea and vomiting)    Pre-diabetes    Pulmonary embolism (HCC) 2020   Sleep apnea    uses cpap   Tendonitis    outer aspect of right foot   Thyroid nodule    UTI (lower urinary tract infection)    Vitamin B 12 deficiency     Surgical History: Past Surgical History:  Procedure Laterality Date   ABDOMINAL HYSTERECTOMY     APPENDECTOMY     COLONOSCOPY     FRACTURE SURGERY     left wrist   FRACTURE SURGERY Left    wrist   INCISION AND DRAINAGE Right 06/03/2021   Procedure: INCISION AND DRAINAGE-Right Index Finger;  Surgeon:  Christena Flake, MD;  Location: [facility] ORS;  Service: Orthopedics;  Laterality: Right;   IVC FILTER INSERTION N/A 03/15/2022   Procedure: IVC FILTER INSERTION;  Surgeon: Renford Dills, MD;  Location: [facility] INVASIVE CV LAB;  Service: Cardiovascular;  Laterality: N/A;   IVC FILTER REMOVAL N/A 05/31/2022   Procedure: IVC FILTER REMOVAL;  Surgeon: Renford Dills, MD;  Location: [facility] INVASIVE CV LAB;  Service: Cardiovascular;  Laterality: N/A;   KNEE SURGERY Left    debridement   TONSILLECTOMY     TOTAL HIP ARTHROPLASTY Right 03/22/2022   Procedure: TOTAL HIP ARTHROPLASTY;  Surgeon: Christena Flake, MD;  Location: [facility] ORS;  Service: Orthopedics;  Laterality: Right;    Home Medications:  Allergies as of 02/17/2023       Reactions   Codeine Nausea And Vomiting   Elemental Sulfur Other (See Comments)   Lip swelling, difficulty breathing   Gabapentin Nausea And Vomiting   Levaquin [levofloxacin] Hives   Penicillins Hives, Other (See Comments)   Did it involve swelling of the face/tongue/throat, SOB, or low BP? Unknown Did it involve sudden or severe rash/hives, skin peeling, or any reaction on the inside of your mouth or nose? Yes Did you need to seek medical attention at a hospital or doctor's office? Yes When did it last happen? many years If all  above answers are "NO", may proceed with cephalosporin use.   Sulfa Antibiotics Other (See Comments)   Other reaction(s): lip swelling   Latex Rash   Peeling skin   Sulfur Other (See Comments), Swelling   Lip swelling    Lip swelling, difficulty breathing        Medication List        Accurate as of Feb 17, 2023 10:51 AM. If you have any questions, ask your nurse or doctor.          acetaminophen 500 MG tablet Commonly known as: TYLENOL Take 500-1,000 mg by mouth every 6 (six) hours as needed for mild pain or moderate pain.   clobetasol cream 0.05 % Commonly known as: TEMOVATE Apply topically.   cyanocobalamin 1000 MCG/ML  injection Commonly known as: VITAMIN B12 Inject 1,000 mcg into the muscle every 30 (thirty) days.   D-3-5 125 MCG (5000 UT) capsule Generic drug: Cholecalciferol Take 5,000 Units by mouth daily.   diclofenac Sodium 1 % Gel Commonly known as: VOLTAREN Apply 2 g topically 4 (four) times daily.   docusate sodium 100 MG capsule Commonly known as: COLACE Take 100 mg by mouth daily.   Eliquis 2.5 MG Tabs tablet Generic drug: apixaban TAKE 1 TABLET TWICE A DAY   ferrous sulfate 325 (65 FE) MG tablet Take 325 mg by mouth daily with breakfast.   multivitamin with minerals tablet Take 2 tablets by mouth daily. Gummy   potassium chloride 10 MEQ tablet Commonly known as: KLOR-CON M Take 10 mEq by mouth 2 (two) times daily.   predniSONE 10 MG (21) Tbpk tablet Commonly known as: STERAPRED UNI-PAK 21 TAB Take by mouth as directed.   tiZANidine 2 MG tablet Commonly known as: ZANAFLEX Take 2 mg by mouth 3 (three) times daily.   traMADol 50 MG tablet Commonly known as: ULTRAM Take by mouth.   vitamin C 100 MG tablet Take 100 mg by mouth daily.        Allergies:  Allergies  Allergen Reactions   Codeine Nausea And Vomiting   Elemental Sulfur Other (See Comments)    Lip swelling, difficulty breathing   Gabapentin Nausea And Vomiting   Levaquin [Levofloxacin] Hives   Penicillins Hives and Other (See Comments)    Did it involve swelling of the face/tongue/throat, SOB, or low BP? Unknown Did it involve sudden or severe rash/hives, skin peeling, or any reaction on the inside of your mouth or nose? Yes Did you need to seek medical attention at a hospital or doctor's office? Yes When did it last happen? many years If all above answers are "NO", may proceed with cephalosporin use.    Sulfa Antibiotics Other (See Comments)    Other reaction(s): lip swelling   Latex Rash    Peeling skin   Sulfur Other (See Comments) and Swelling    Lip swelling    Lip swelling, difficulty  breathing    Family History: Family History  Problem Relation Age of Onset   Breast cancer Mother    Asthma Mother    Lung cancer Father    Heart disease Brother    Heart disease Maternal Grandfather    Throat cancer Maternal Grandfather     Social History:   reports that she has never smoked. She has never been exposed to tobacco smoke. She has never used smokeless tobacco. She reports that she does not drink alcohol and does not use drugs.  Physical Exam: BP 99/63   Pulse 63  Ht 5\' 6"  (1.676 m)   Wt 170 lb (77.1 kg)   BMI 27.44 kg/m   Constitutional:  Alert and oriented, no acute distress, nontoxic appearing HEENT: [place], AT Cardiovascular: No clubbing, cyanosis, or edema Respiratory: Normal respiratory effort, no increased work of breathing GU: No CVA tenderness Skin: No rashes, bruises or suspicious lesions Neurologic: Grossly intact, no focal deficits, moving all 4 extremities Psychiatric: Normal mood and affect  Pertinent Imaging: CTAP with and without contrast, 02/15/2023: CLINICAL DATA:  Left flank pain and urinary urgency. Evaluate hydronephrosis.   EXAM: CT ABDOMEN AND PELVIS WITHOUT AND WITH CONTRAST   TECHNIQUE: Multidetector CT imaging of the abdomen and pelvis was performed following the standard protocol before and following the bolus administration of intravenous contrast.   RADIATION DOSE REDUCTION: This exam was performed according to the departmental dose-optimization program which includes automated exposure control, adjustment of the mA and/or kV according to patient size and/or use of iterative reconstruction technique.   CONTRAST:  OMNIPAQUE IOHEXOL 300 MG/ML  SOLN   COMPARISON:  Stone study of 08/27/2021   FINDINGS: Lower chest: Left base scarring. Mild cardiomegaly, without pleural fluid. Trace pericardial fluid is likely physiologic. Tiny hiatal hernia.   Hepatobiliary: Mild hepatomegaly at 18.8 cm craniocaudal.  Normal gallbladder, without biliary ductal dilatation.   Pancreas: Normal, without mass or ductal dilatation.   Spleen: Normal in size, without focal abnormality.   Adrenals/Urinary Tract: Normal adrenal glands. No renal or ureteric calculi. Degraded evaluation of the pelvis, secondary to beam hardening artifact from right hip arthroplasty. No gross bladder calculi.   No renal mass on post-contrast imaging. Minimal left-sided caliectasis. Mildly prominent left extrarenal pelvis.   The renal collecting systems are moderately well opacified on delayed images. The ureters are not opacified. Delayed images were not performed through the pelvis. No enhancing bladder mass.   Stomach/Bowel: Normal stomach, without wall thickening. Colonic stool burden suggests constipation. Normal terminal ileum. Normal small bowel.   Vascular/Lymphatic: Aortic atherosclerosis. Retroaortic left renal vein. No abdominopelvic adenopathy.   Reproductive: Hysterectomy.  No adnexal mass.   Other: No significant free fluid.  No free intraperitoneal air.   Musculoskeletal: Right hip arthroplasty.   IMPRESSION: 1. Minimal left-sided caliectasis without hydroureter or cause identified. This could be within normal variation or represent a mild UPJ obstruction. 2.  No acute abdominal process. 3. Degraded evaluation of the pelvis, secondary to beam hardening artifact from right hip arthroplasty 4.  Possible constipation. 5. Mild hepatomegaly Tiny hiatal hernia.     Electronically Signed   By: Jeronimo Greaves M.D.   On: 02/15/2023 18:28  I personally reviewed the images referenced above and note rather stable left collecting system fullness dating back to CT stone study from 2022.  Assessment & Plan:   1. Hydronephrosis with ureteropelvic junction (UPJ) obstruction CT shows some fullness of the left renal collecting system with normal caliber left ureter consistent with likely mild UPJ obstruction.  Her  renal function has been stable and I do not think that her occasional back pain represents true Dietl's crisis and is more likely musculoskeletal in origin.  No evidence of urologic malignancy on CT.  I recommended follow-up next year with BMP and renal ultrasound.  She is in agreement with this plan.  We discussed that if she has progressive hydronephrosis, left flank pain, or worsening renal function, then we may need to consider stent placement versus consideration of pyeloplasty in the future.  I do not see any indication for  these interventions at this time. - Basic metabolic panel; Future - Ultrasound renal complete; Future  Return in about 1 year (around 02/17/2024) for Annual f/u with BMP, RUS prior with Dr. Apolinar Junes.  Carman Ching, PA-C  [facility] [address], [address] [place], [place] [address] [phone]

## 2023-02-21 ENCOUNTER — Inpatient Hospital Stay (HOSPITAL_BASED_OUTPATIENT_CLINIC_OR_DEPARTMENT_OTHER): Payer: Medicare Other | Admitting: Oncology

## 2023-02-21 ENCOUNTER — Inpatient Hospital Stay: Payer: Medicare Other

## 2023-02-21 ENCOUNTER — Encounter: Payer: Self-pay | Admitting: Oncology

## 2023-02-21 VITALS — BP 100/61 | HR 62 | Temp 97.3°F | Resp 18 | Wt 176.0 lb

## 2023-02-21 DIAGNOSIS — Z86718 Personal history of other venous thrombosis and embolism: Secondary | ICD-10-CM | POA: Diagnosis not present

## 2023-02-21 DIAGNOSIS — D508 Other iron deficiency anemias: Secondary | ICD-10-CM

## 2023-02-21 DIAGNOSIS — D509 Iron deficiency anemia, unspecified: Secondary | ICD-10-CM | POA: Diagnosis not present

## 2023-02-21 MED ORDER — APIXABAN 2.5 MG PO TABS: 2.5000 mg | ORAL_TABLET | Freq: Two times a day (BID) | ORAL | 1 refills | Status: DC

## 2023-02-21 NOTE — Assessment & Plan Note (Signed)
Labs are reviewed and discussed with patient.  Continue Eliquis 2.5 mg twice daily.  Stable hemoglobin. 

## 2023-02-21 NOTE — Assessment & Plan Note (Addendum)
Hemoglobin is slightly decreased, likely due to blood loss form her hip surgery.  Labs are reviewed and discussed with patient. Lab Results  Component Value Date   HGB 11.4 (L) 01/25/2023   TIBC 297 01/25/2023   IRONPCTSAT 36 (H) 01/25/2023   FERRITIN 70 01/25/2023    No need for additional IV Venfoer.

## 2023-02-21 NOTE — Progress Notes (Signed)
Hematology/Oncology Progress note [phone] [phone]      Patient Care Team: Patrice Paradise, MD as PCP - General (Physician Assistant) Rickard Patience, MD as Consulting Physician (Oncology)  CHIEF COMPLAINTS/REASON FOR VISIT:  Follow up for pulmonary  embolism, anemia.    ASSESSMENT & PLAN:   History of deep vein thrombosis (DVT) of lower extremity Labs are reviewed and discussed with patient.  Continue Eliquis 2.5 mg twice daily.  Stable hemoglobin.  IDA (iron deficiency anemia) Hemoglobin is slightly decreased, likely due to blood loss form her hip surgery.  Labs are reviewed and discussed with patient. Lab Results  Component Value Date   HGB 11.4 (L) 01/25/2023   TIBC 297 01/25/2023   IRONPCTSAT 36 (H) 01/25/2023   FERRITIN 70 01/25/2023    No need for additional IV Venfoer.   Orders Placed This Encounter  Procedures   CBC with Differential (Cancer Center Only)    Standing Status:   Future    Standing Expiration Date:   02/21/2024   Iron and TIBC    Standing Status:   Future    Standing Expiration Date:   02/21/2024   Ferritin    Standing Status:   Future    Standing Expiration Date:   02/21/2024   Retic Panel    Standing Status:   Future    Standing Expiration Date:   02/21/2024   Follow-up in 6 months. All questions were answered. The patient knows to call the clinic with any problems, questions or concerns.  Rickard Patience, MD, PhD [facility]ology Oncology 02/21/2023     HISTORY OF PRESENTING ILLNESS:   Dana Bautista is a  77 y.o.  female with PMH listed below was seen in consultation at the request of  Patrice Paradise, MD  for evaluation of pulmonary embolism  Patient was admitted from 10/04/2019-10/05/2019 due to acute bilateral pulmonary embolism and DVT.  Patient denies any immobilization factors prior to the events.  Her initial symptom was sudden onset of left-sided chest pain. 10/04/2019, CT chest angiogram showed acute  bilateral lower lobe pulmonary embolism.  No right heart strain.  Small left pleural effusion. 10/05/2019 bilateral lower extremity ultrasound showed right calf posterior calf occlusive DVT.  Very low thrombus burden.  Patient was started on anticoagulation and discharged on Eliquis. She has been on anticoagulation for slightly more than 6 months. She was referred to hematology for evaluation management. Patient reports that the left chest pain has completely resolved.  She was less active since the diagnosis of pulmonary embolism and has been deconditioned.  She has tried to exercise more and her exercise endurance has improved. Intermittently she experienced left lower extremity swelling. She denies any constitutional symptoms.  July 2021 -switched to Eliquis 2.5mg  BID  INTERVAL HISTORY Dana Bautista is a 77 y.o. female who has above history reviewed by me today presents for follow up visit for management of history of thrombosis.  Patient has been on Eliquis 2.5 mg twice daily.  No bleeding events. No new complains today. She gets monthly B12 injections at her PCP's office.   Review of Systems  Constitutional:  Negative for appetite change, chills, fatigue and fever.  HENT:   Negative for hearing loss and voice change.   Eyes:  Negative for eye problems.  Respiratory:  Negative for chest tightness and cough.   Cardiovascular:  Negative for chest pain.  Gastrointestinal:  Negative for abdominal distention, abdominal pain and blood in stool.  Endocrine: Negative for hot  flashes.  Genitourinary:  Negative for difficulty urinating and frequency.   Musculoskeletal:  Negative for arthralgias.  Skin:  Negative for itching and rash.  Neurological:  Negative for extremity weakness.  Hematological:  Negative for adenopathy.  Psychiatric/Behavioral:  Negative for confusion.     MEDICAL HISTORY:  Past Medical History:  Diagnosis Date   Anemia    Arthritis    osteoarthriist   Cancer (HCC)     skin   Carotid stenosis, asymptomatic, bilateral    Cellulitis    Complication of anesthesia    nausea   DVT (deep venous thrombosis) (HCC) 09/2019   Heart murmur    History of methicillin resistant staphylococcus aureus (MRSA) 2020   Mitral valve regurgitation    Pericardial effusion    Pleural effusion    PONV (postoperative nausea and vomiting)    Pre-diabetes    Pulmonary embolism (HCC) 2020   Sleep apnea    uses cpap   Tendonitis    outer aspect of right foot   Thyroid nodule    UTI (lower urinary tract infection)    Vitamin B 12 deficiency     SURGICAL HISTORY: Past Surgical History:  Procedure Laterality Date   ABDOMINAL HYSTERECTOMY     APPENDECTOMY     COLONOSCOPY     FRACTURE SURGERY     left wrist   FRACTURE SURGERY Left    wrist   INCISION AND DRAINAGE Right 06/03/2021   Procedure: INCISION AND DRAINAGE-Right Index Finger;  Surgeon: Christena Flake, MD;  Location: [facility] ORS;  Service: Orthopedics;  Laterality: Right;   IVC FILTER INSERTION N/A 03/15/2022   Procedure: IVC FILTER INSERTION;  Surgeon: Renford Dills, MD;  Location: [facility] INVASIVE CV LAB;  Service: Cardiovascular;  Laterality: N/A;   IVC FILTER REMOVAL N/A 05/31/2022   Procedure: IVC FILTER REMOVAL;  Surgeon: Renford Dills, MD;  Location: [facility] INVAS[facility] CV LAB;  Service: Cardiovascular;  Laterality: N/A;   KNEE SURGERY Left    debridement   TONSILLECTOMY     TOTAL HIP ARTHROPLASTY Right 03/22/2022   Procedure: TOTAL HIP ARTHROPLASTY;  Surgeon: Christena Flake, MD;  Location: [facility] ORS;  Service: Orthopedics;  Laterality: Right;    SOCIAL HISTORY: Social History   Socioeconomic History   Marital status: Married    Spouse name: Not on file   Number of children: Not on file   Years of education: Not on file   Highest education level: Not on file  Occupational History   Not on file  Tobacco Use   Smoking status: Never    Passive exposure: Never   Smokeless tobacco: Never  Vaping Use    Vaping Use: Never used  Substance and Sexual Activity   Alcohol use: No   Drug use: No   Sexual activity: Not on file  Other Topics Concern   Not on file  Social History Narrative   Not on file   Social Determinants of Health   Financial Resource Strain: Not on file  Food Insecurity: Not on file  Transportation Needs: Not on file  Physical Activity: Not on file  Stress: Not on file  Social Connections: Not on file  Intimate Partner Violence: Not on file    FAMILY HISTORY: Family History  Problem Relation Age of Onset   Breast cancer Mother    Asthma Mother    Lung cancer Father    Heart disease Brother    Heart disease Maternal Grandfather    Throat cancer Maternal  Grandfather     ALLERGIES:  is allergic to codeine, elemental sulfur, gabapentin, levaquin [levofloxacin], penicillins, sulfa antibiotics, latex, and sulfur.  MEDICATIONS:  Current Outpatient Medications  Medication Sig Dispense Refill   acetaminophen (TYLENOL) 500 MG tablet Take 500-1,000 mg by mouth every 6 (six) hours as needed for mild pain or moderate pain.     Ascorbic Acid (VITAMIN C) 100 MG tablet Take 100 mg by mouth daily.     Cholecalciferol (D-3-5) 125 MCG (5000 UT) capsule Take 5,000 Units by mouth daily.     clobetasol cream (TEMOVATE) 0.05 % Apply topically.     cyanocobalamin (,VITAMIN B-12,) 1000 MCG/ML injection Inject 1,000 mcg into the muscle every 30 (thirty) days.     diclofenac Sodium (VOLTAREN) 1 % GEL Apply 2 g topically 4 (four) times daily. 50 g 0   docusate sodium (COLACE) 100 MG capsule Take 100 mg by mouth daily.     ferrous sulfate 325 (65 FE) MG tablet Take 325 mg by mouth daily with breakfast.     Multiple Vitamins-Minerals (MULTIVITAMIN WITH MINERALS) tablet Take 2 tablets by mouth daily. Gummy     potassium chloride (KLOR-CON M) 10 MEQ tablet Take 10 mEq by mouth 2 (two) times daily.     tiZANidine (ZANAFLEX) 2 MG tablet Take 2 mg by mouth 3 (three) times daily.      apixaban (ELIQUIS) 2.5 MG TABS tablet Take 1 tablet (2.5 mg total) by mouth 2 (two) times daily. 180 tablet 1   traMADol (ULTRAM) 50 MG tablet Take by mouth. (Patient not taking: Reported on 02/21/2023)     No current facility-administered medications for this visit.     PHYSICAL EXAMINATION: ECOG PERFORMANCE STATUS: 1 - Symptomatic but completely ambulatory Vitals:   02/21/23 1310  BP: 100/61  Pulse: 62  Resp: 18  Temp: (!) 97.3 F (36.3 C)   Filed Weights   02/21/23 1310  Weight: 176 lb (79.8 kg)    Physical Exam Constitutional:      General: She is not in acute distress. HENT:     Head: Normocephalic and atraumatic.  Eyes:     General: No scleral icterus. Cardiovascular:     Rate and Rhythm: Normal rate and regular rhythm.     Heart sounds: Normal heart sounds.  Pulmonary:     Effort: Pulmonary effort is normal. No respiratory distress.     Breath sounds: No wheezing.  Abdominal:     General: Bowel sounds are normal. There is no distension.     Palpations: Abdomen is soft.  Musculoskeletal:        General: No deformity. Normal range of motion.     Cervical back: Normal range of motion and neck supple.     Comments: Trace edema, bilateral lower extremities.  varicose vein bilaterally.   Skin:    General: Skin is warm and dry.     Findings: No erythema or rash.  Neurological:     Mental Status: She is alert and oriented to person, place, and time. Mental status is at baseline.     Cranial Nerves: No cranial nerve deficit.     Coordination: Coordination normal.  Psychiatric:        Mood and Affect: Mood normal.     LABORATORY DATA:  I have reviewed the data as listed Lab Results  Component Value Date   WBC 5.7 01/25/2023   HGB 11.4 (L) 01/25/2023   HCT 35.4 (L) 01/25/2023   MCV 88.5 01/25/2023  PLT 241 01/25/2023   Recent Labs    06/20/22 1055 08/30/22 1430 01/25/23 1000  NA 137 131* 133*  K 4.2 3.5 3.7  CL 109 97* 102  CO2 23 18* 25  GLUCOSE  134* 114* 93  BUN 22 13 17   CREATININE 0.65 0.85 0.58  CALCIUM 9.0 9.6 8.8*  GFRNONAA >60 >60 >60  PROT 6.6  --  6.7  ALBUMIN 3.7  --  3.9  AST 20  --  15  ALT 9  --  12  ALKPHOS 60  --  59  BILITOT 0.7  --  0.9    Iron/TIBC/Ferritin/ %Sat    Component Value Date/Time   IRON 106 01/25/2023 1000   TIBC 297 01/25/2023 1000   FERRITIN 70 01/25/2023 1000   IRONPCTSAT 36 (H) 01/25/2023 1000    hypercoagulable state work up.  Negative prothrombin gene mutation, Factor V leiden mutation.  Negative anticardiolipin IgM, indeterminate level of anticardiolipin IgG, not meeting antiphospholipid syndrome diagnosis criteria.   RADIOGRAPHIC STUDIES: I have personally reviewed the radiological images as listed and agreed with the findings in the report. CT Abdomen Pelvis W Wo Contrast  Result Date: 02/15/2023 CLINICAL DATA:  Left flank pain and urinary urgency. Evaluate hydronephrosis. EXAM: CT ABDOMEN AND PELVIS WITHOUT AND WITH CONTRAST TECHNIQUE: Multidetector CT imaging of the abdomen and pelvis was performed following the standard protocol before and following the bolus administration of intravenous contrast. RADIATION DOSE REDUCTION: This exam was performed according to the departmental dose-optimization program which includes automated exposure control, adjustment of the mA and/or kV according to patient size and/or use of iterative reconstruction technique. CONTRAST:  OMNIPAQUE IOHEXOL 300 MG/ML  SOLN COMPARISON:  Stone study of 08/27/2021 FINDINGS: Lower chest: Left base scarring. Mild cardiomegaly, without pleural fluid. Trace pericardial fluid is likely physiologic. Tiny hiatal hernia. Hepatobiliary: Mild hepatomegaly at 18.8 cm craniocaudal. Normal gallbladder, without biliary ductal dilatation. Pancreas: Normal, without mass or ductal dilatation. Spleen: Normal in size, without focal abnormality. Adrenals/Urinary Tract: Normal adrenal glands. No renal or ureteric calculi. Degraded  evaluation of the pelvis, secondary to beam hardening artifact from right hip arthroplasty. No gross bladder calculi. No renal mass on post-contrast imaging. Minimal left-sided caliectasis. Mildly prominent left extrarenal pelvis. The renal collecting systems are moderately well opacified on delayed images. The ureters are not opacified. Delayed images were not performed through the pelvis. No enhancing bladder mass. Stomach/Bowel: Normal stomach, without wall thickening. Colonic stool burden suggests constipation. Normal terminal ileum. Normal small bowel. Vascular/Lymphatic: Aortic atherosclerosis. Retroaortic left renal vein. No abdominopelvic adenopathy. Reproductive: Hysterectomy.  No adnexal mass. Other: No significant free fluid.  No free intraperitoneal air. Musculoskeletal: Right hip arthroplasty. IMPRESSION: 1. Minimal left-sided caliectasis without hydroureter or cause identified. This could be within normal variation or represent a mild UPJ obstruction. 2.  No acute abdominal process. 3. Degraded evaluation of the pelvis, secondary to beam hardening artifact from right hip arthroplasty 4.  Possible constipation. 5. Mild hepatomegaly Tiny hiatal hernia. Electronically Signed   By: Jeronimo Greaves M.D.   On: 02/15/2023 18:28

## 2023-03-08 ENCOUNTER — Telehealth (INDEPENDENT_AMBULATORY_CARE_PROVIDER_SITE_OTHER): Payer: Self-pay | Admitting: Nurse Practitioner

## 2023-03-08 NOTE — Telephone Encounter (Signed)
Spoke with pt regarding scheduling more sclero appts. I again stated that the PA is only for medically necessity NOT for any financial obligations. I advised pt to call Medicare and check if there are lifetime limits on her plan for CPT 36471 and DX I83.819. I gave these codes to pt and advised that they may want this. Pt acknowledged and stated that she will call us back to schedule.   right leg SALINE sclero. no prior auth req (Medicare)

## 2023-03-31 ENCOUNTER — Other Ambulatory Visit: Payer: Self-pay | Admitting: Orthopedic Surgery

## 2023-03-31 DIAGNOSIS — M1711 Unilateral primary osteoarthritis, right knee: Secondary | ICD-10-CM

## 2023-04-12 ENCOUNTER — Encounter: Payer: Self-pay | Admitting: Oncology

## 2023-04-14 ENCOUNTER — Other Ambulatory Visit: Payer: Self-pay

## 2023-04-14 MED ORDER — APIXABAN 2.5 MG PO TABS: 2.5000 mg | ORAL_TABLET | Freq: Two times a day (BID) | ORAL | 1 refills | Status: DC

## 2023-04-15 ENCOUNTER — Ambulatory Visit
Admission: RE | Admit: 2023-04-15 | Discharge: 2023-04-15 | Disposition: A | Discharge status: Home / Self Care | Payer: Medicare Other | Source: Ambulatory Visit | Attending: Orthopedic Surgery | Admitting: Orthopedic Surgery

## 2023-04-15 DIAGNOSIS — M1711 Unilateral primary osteoarthritis, right knee: Secondary | ICD-10-CM

## 2023-05-12 ENCOUNTER — Encounter: Payer: Self-pay | Admitting: Oncology

## 2023-05-12 ENCOUNTER — Encounter (INDEPENDENT_AMBULATORY_CARE_PROVIDER_SITE_OTHER): Payer: Self-pay | Admitting: Nurse Practitioner

## 2023-05-12 ENCOUNTER — Ambulatory Visit (INDEPENDENT_AMBULATORY_CARE_PROVIDER_SITE_OTHER): Payer: Medicare Other | Admitting: Nurse Practitioner

## 2023-05-12 VITALS — BP 96/54 | HR 67 | Resp 16 | Wt 176.0 lb

## 2023-05-12 DIAGNOSIS — I83813 Varicose veins of bilateral lower extremities with pain: Secondary | ICD-10-CM | POA: Diagnosis not present

## 2023-05-12 DIAGNOSIS — I83819 Varicose veins of unspecified lower extremities with pain: Secondary | ICD-10-CM

## 2023-05-14 ENCOUNTER — Encounter (INDEPENDENT_AMBULATORY_CARE_PROVIDER_SITE_OTHER): Payer: Self-pay | Admitting: Nurse Practitioner

## 2023-05-14 NOTE — Progress Notes (Signed)
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   Procedure: Sclerotherapy using hypertonic saline mixed with 1% Lidocaine was performed on the bilateral lower extremity. Compression wraps were placed. The patient tolerated the procedure well.  Discussed with patient that her upcoming knee surgery would also require an IVC filter.  Patient has a large varicosities that if they do not show improvement after this sclerotherapy we will likely need to repeat a reflux study to determine if she may need to undergo laser ablation versus foam sclerotherapy.

## 2023-06-12 ENCOUNTER — Encounter (INDEPENDENT_AMBULATORY_CARE_PROVIDER_SITE_OTHER): Payer: Self-pay | Admitting: Nurse Practitioner

## 2023-06-12 ENCOUNTER — Ambulatory Visit (INDEPENDENT_AMBULATORY_CARE_PROVIDER_SITE_OTHER): Payer: Medicare Other | Admitting: Nurse Practitioner

## 2023-06-12 ENCOUNTER — Telehealth: Payer: Self-pay | Admitting: Oncology

## 2023-06-12 VITALS — BP 117/76 | HR 70 | Resp 16 | Wt 176.4 lb

## 2023-06-12 DIAGNOSIS — I83811 Varicose veins of right lower extremities with pain: Secondary | ICD-10-CM | POA: Diagnosis not present

## 2023-06-12 DIAGNOSIS — I83819 Varicose veins of unspecified lower extremities with pain: Secondary | ICD-10-CM

## 2023-06-12 NOTE — Progress Notes (Signed)
Varicose veins of right  lower extremity with inflammation (454.1  I83.10) Current Plans   Indication: Patient presents with symptomatic varicose veins of the right  lower extremity.   Procedure: Sclerotherapy using hypertonic saline mixed with 1% Lidocaine was performed on the right lower extremity. Compression wraps were placed. The patient tolerated the procedure well.  The patient has a large varicosity on the right lower extremity which came up relatively sudden.  Sclerotherapy did not treat very much recently.  We will have the patient return for reflux study to ensure that there is no significant reflux and the patient may not need more extensive intervention to treat this area.  In addition the patient is to have been to have an upcoming knee surgery on the right lower extremity she will contact her office for scheduling of her IVC filter.

## 2023-06-12 NOTE — Telephone Encounter (Signed)
Patient states she has a knee replacement surgery on 10/31 and wants to know if she needs to reschedule her lab 11/18, and MD /Iron on 11/20. Please advise. Thanks!

## 2023-06-13 ENCOUNTER — Telehealth (INDEPENDENT_AMBULATORY_CARE_PROVIDER_SITE_OTHER): Payer: Self-pay

## 2023-06-13 NOTE — Telephone Encounter (Signed)
Patient called and left a message regarding be scheduled for a IVC filter placement. We have seen the patient for sclerotherapy but not for filter placement. I looked in the workqueue for a referral for a IVC filter placement and did not see one. I attempted to contact the patient to ask about this and a message was left for a return call. The only thing I found was a note from Sheppard Plumber NP stating the patient is the have upcoming knee surgery and should contact her office for scheduling of the filter. This was added to a sclerotherapy note.

## 2023-06-13 NOTE — Telephone Encounter (Signed)
I have attempted to contact the patient to schedule a IVC filter placement and a message has been left for a return call.

## 2023-06-15 ENCOUNTER — Telehealth (INDEPENDENT_AMBULATORY_CARE_PROVIDER_SITE_OTHER): Payer: Self-pay | Admitting: Nurse Practitioner

## 2023-06-15 ENCOUNTER — Telehealth (INDEPENDENT_AMBULATORY_CARE_PROVIDER_SITE_OTHER): Payer: Self-pay

## 2023-06-15 NOTE — Telephone Encounter (Signed)
I would just do the [place] and wait on the schlero for the simple fact that the surgery may undo some of our work and sometimes when ortho sees wounds they are not comfortable proceeding with Surgery.  So for now, just the [place]

## 2023-06-15 NOTE — Telephone Encounter (Signed)
I have attempted to contact the patient on multiple occasions to schedule her for a IVC filter placement per Sheppard Plumber NP. A message was left for a return call.

## 2023-06-15 NOTE — Telephone Encounter (Signed)
Patient came into office wanting to ask a question but only feels comfortable of NP Sheppard Plumber calls her with the answer. She is getting a [place] and sclero done on 07/12/2023 and is also supposed to  having a surgery on 07/27/2023. She is wanting to know is it safe to have the injections and get the surgery or should she wait and just get the [place] and come back and do sclero later. What should she do      Please call and advise

## 2023-06-20 ENCOUNTER — Telehealth (INDEPENDENT_AMBULATORY_CARE_PROVIDER_SITE_OTHER): Payer: Self-pay

## 2023-06-20 NOTE — Telephone Encounter (Signed)
I have attempted to contact the patient on multiple occasions and a message left each time for a return call to schedule a IVC filter placement.

## 2023-06-23 ENCOUNTER — Telehealth (INDEPENDENT_AMBULATORY_CARE_PROVIDER_SITE_OTHER): Payer: Self-pay

## 2023-06-23 ENCOUNTER — Encounter (INDEPENDENT_AMBULATORY_CARE_PROVIDER_SITE_OTHER): Payer: Self-pay

## 2023-06-23 NOTE — Telephone Encounter (Signed)
I attempted to contact the patient and a message was left for a return call. 

## 2023-06-26 ENCOUNTER — Telehealth (INDEPENDENT_AMBULATORY_CARE_PROVIDER_SITE_OTHER): Payer: Self-pay

## 2023-06-26 ENCOUNTER — Telehealth (INDEPENDENT_AMBULATORY_CARE_PROVIDER_SITE_OTHER): Payer: Self-pay | Admitting: Nurse Practitioner

## 2023-06-26 NOTE — Telephone Encounter (Signed)
Patient has been scheduled with Dr. Wyn Quaker for a IVC filter placement on 07/17/23 with a 6:45 am arrival time to the [facility]. Pre-procedure instructions will be sent to Mychart and mailed.

## 2023-06-26 NOTE — Telephone Encounter (Signed)
I again have attempted to contact he patient to schedule a IVC filter placement with Dr. Wyn Quaker. A message was left on her voicemail as before for a return call.

## 2023-06-28 ENCOUNTER — Telehealth (INDEPENDENT_AMBULATORY_CARE_PROVIDER_SITE_OTHER): Payer: Self-pay

## 2023-06-28 NOTE — Telephone Encounter (Signed)
Patient called back and she was informed that her IVC filter is scheduled with Dr. Gilda Crease for 07/18/23 with a 6:45 am arrival time to the [facility]. Patient was initially scheduled with Dr. Wyn Quaker. Pre-procedure instructions will be sent to Mychart.

## 2023-06-28 NOTE — Telephone Encounter (Signed)
Patient called and left two messages wanting a callback to be scheduled for a IVC filter placement. I have attempted again to contact and the call goes straight to voicemail.

## 2023-06-28 NOTE — Telephone Encounter (Signed)
I have also on multiple occasions left messages on the spouse mobile phone. Both of their phones go straight to voicemail.

## 2023-06-30 ENCOUNTER — Telehealth: Payer: Self-pay

## 2023-06-30 MED ORDER — MIRABEGRON ER 50 MG PO TB24: 50.0000 mg | ORAL_TABLET | Freq: Every day | ORAL | 11 refills | Status: DC

## 2023-06-30 NOTE — Telephone Encounter (Signed)
Spoke with patient and advised results rx sent to pharmacy by e-script  

## 2023-06-30 NOTE — Telephone Encounter (Signed)
Patient called in and states that she would like to restart Mybetriq, per MacDiarmid's note in 04/24. Patient would call if wanting to resume. States that she has having much more frequency, especially at night. Please let patient know if able to send in medication.

## 2023-07-04 ENCOUNTER — Telehealth: Payer: Self-pay | Admitting: Urology

## 2023-07-04 ENCOUNTER — Telehealth: Payer: Self-pay

## 2023-07-04 NOTE — Telephone Encounter (Signed)
Dr Sherron Monday gave pt samples of Myrbetriq 50 mg.  We sent in a prescription, pt said insurance didn't approve it.  They said they sent a message to dr office.

## 2023-07-04 NOTE — Telephone Encounter (Addendum)
Pt left message on the triage line, states her insurance won't cover myrbetriq, so she is wondering if she can come by and pick up samples. Please advise.

## 2023-07-04 NOTE — Telephone Encounter (Signed)
Please advise on what pt can switch to.

## 2023-07-05 NOTE — Telephone Encounter (Signed)
.  left message to have patient return my call.  

## 2023-07-06 ENCOUNTER — Other Ambulatory Visit (INDEPENDENT_AMBULATORY_CARE_PROVIDER_SITE_OTHER): Payer: Self-pay | Admitting: Nurse Practitioner

## 2023-07-06 DIAGNOSIS — I83819 Varicose veins of unspecified lower extremities with pain: Secondary | ICD-10-CM

## 2023-07-06 MED ORDER — TOLTERODINE TARTRATE ER 4 MG PO CP24: 4.0000 mg | ORAL_CAPSULE | Freq: Every day | ORAL | 11 refills | Status: DC

## 2023-07-06 NOTE — Telephone Encounter (Signed)
Spoke with patient and advised results rx sent to pharmacy by e-script  

## 2023-07-12 ENCOUNTER — Ambulatory Visit (INDEPENDENT_AMBULATORY_CARE_PROVIDER_SITE_OTHER): Payer: Medicare Other

## 2023-07-12 ENCOUNTER — Ambulatory Visit (INDEPENDENT_AMBULATORY_CARE_PROVIDER_SITE_OTHER): Payer: Medicare Other | Admitting: Nurse Practitioner

## 2023-07-12 DIAGNOSIS — I83811 Varicose veins of right lower extremities with pain: Secondary | ICD-10-CM | POA: Diagnosis not present

## 2023-07-12 DIAGNOSIS — I83819 Varicose veins of unspecified lower extremities with pain: Secondary | ICD-10-CM

## 2023-07-17 DIAGNOSIS — I82409 Acute embolism and thrombosis of unspecified deep veins of unspecified lower extremity: Secondary | ICD-10-CM

## 2023-07-18 ENCOUNTER — Ambulatory Visit
Admission: RE | Admit: 2023-07-18 | Discharge: 2023-07-18 | Disposition: A | Discharge status: Home / Self Care | Payer: Medicare Other | Attending: Vascular Surgery | Admitting: Vascular Surgery

## 2023-07-18 ENCOUNTER — Encounter
Admission: RE | Disposition: A | Discharge status: Home / Self Care | Payer: Self-pay | Source: Home / Self Care | Attending: Vascular Surgery

## 2023-07-18 ENCOUNTER — Encounter: Payer: Self-pay | Admitting: Vascular Surgery

## 2023-07-18 ENCOUNTER — Other Ambulatory Visit: Payer: Self-pay

## 2023-07-18 DIAGNOSIS — Z86711 Personal history of pulmonary embolism: Secondary | ICD-10-CM | POA: Insufficient documentation

## 2023-07-18 DIAGNOSIS — M179 Osteoarthritis of knee, unspecified: Secondary | ICD-10-CM

## 2023-07-18 DIAGNOSIS — D6869 Other thrombophilia: Secondary | ICD-10-CM | POA: Diagnosis not present

## 2023-07-18 DIAGNOSIS — Z408 Encounter for other prophylactic surgery: Secondary | ICD-10-CM | POA: Insufficient documentation

## 2023-07-18 DIAGNOSIS — M79606 Pain in leg, unspecified: Secondary | ICD-10-CM | POA: Insufficient documentation

## 2023-07-18 DIAGNOSIS — Z9889 Other specified postprocedural states: Secondary | ICD-10-CM

## 2023-07-18 DIAGNOSIS — Z7901 Long term (current) use of anticoagulants: Secondary | ICD-10-CM

## 2023-07-18 DIAGNOSIS — D6859 Other primary thrombophilia: Secondary | ICD-10-CM | POA: Diagnosis not present

## 2023-07-18 DIAGNOSIS — Z86718 Personal history of other venous thrombosis and embolism: Secondary | ICD-10-CM | POA: Diagnosis not present

## 2023-07-18 DIAGNOSIS — K219 Gastro-esophageal reflux disease without esophagitis: Secondary | ICD-10-CM

## 2023-07-18 DIAGNOSIS — I82409 Acute embolism and thrombosis of unspecified deep veins of unspecified lower extremity: Secondary | ICD-10-CM

## 2023-07-18 DIAGNOSIS — M1990 Unspecified osteoarthritis, unspecified site: Secondary | ICD-10-CM | POA: Insufficient documentation

## 2023-07-18 HISTORY — PX: IVC FILTER INSERTION: CATH118245

## 2023-07-18 SURGERY — IVC FILTER INSERTION
Anesthesia: Moderate Sedation

## 2023-07-18 MED ORDER — MIDAZOLAM HCL 2 MG/2ML IJ SOLN
INTRAMUSCULAR | Status: AC
  Filled 2023-07-18: qty 2

## 2023-07-18 MED ORDER — IODIXANOL 320 MG/ML IV SOLN
INTRAVENOUS | Status: DC | PRN
  Administered 2023-07-18: 15 mL

## 2023-07-18 MED ORDER — CEFAZOLIN SODIUM-DEXTROSE 2-4 GM/100ML-% IV SOLN
INTRAVENOUS | Status: AC
Start: 2023-07-18 — End: ?
  Filled 2023-07-18: qty 100

## 2023-07-18 MED ORDER — MIDAZOLAM HCL 2 MG/2ML IJ SOLN
INTRAMUSCULAR | Status: DC | PRN
  Administered 2023-07-18: 1 mg via INTRAVENOUS
  Administered 2023-07-18: .5 mg via INTRAVENOUS
  Administered 2023-07-18: 2 mg via INTRAVENOUS

## 2023-07-18 MED ORDER — ONDANSETRON HCL 4 MG/2ML IJ SOLN: 4.0000 mg | Freq: Four times a day (QID) | INTRAMUSCULAR | Status: DC | PRN

## 2023-07-18 MED ORDER — HYDROMORPHONE HCL 1 MG/ML IJ SOLN: 1.0000 mg | Freq: Once | INTRAMUSCULAR | Status: DC | PRN

## 2023-07-18 MED ORDER — METHYLPREDNISOLONE SODIUM SUCC 125 MG IJ SOLR: 125.0000 mg | Freq: Once | INTRAMUSCULAR | Status: DC | PRN

## 2023-07-18 MED ORDER — FENTANYL CITRATE (PF) 100 MCG/2ML IJ SOLN
INTRAMUSCULAR | Status: AC
  Filled 2023-07-18: qty 2

## 2023-07-18 MED ORDER — LIDOCAINE HCL (PF) 1 % IJ SOLN
INTRAMUSCULAR | Status: DC | PRN
  Administered 2023-07-18: 10 mL

## 2023-07-18 MED ORDER — FAMOTIDINE 20 MG PO TABS: 40.0000 mg | ORAL_TABLET | Freq: Once | ORAL | Status: DC | PRN

## 2023-07-18 MED ORDER — HEPARIN (PORCINE) IN NACL 1000-0.9 UT/500ML-% IV SOLN
INTRAVENOUS | Status: DC | PRN
  Administered 2023-07-18: 500 mL

## 2023-07-18 MED ORDER — FENTANYL CITRATE (PF) 100 MCG/2ML IJ SOLN
INTRAMUSCULAR | Status: DC | PRN
  Administered 2023-07-18: 25 ug via INTRAVENOUS
  Administered 2023-07-18: 50 ug via INTRAVENOUS
  Administered 2023-07-18: 25 ug via INTRAVENOUS

## 2023-07-18 MED ORDER — DIPHENHYDRAMINE HCL 50 MG/ML IJ SOLN: 50.0000 mg | Freq: Once | INTRAMUSCULAR | Status: DC | PRN

## 2023-07-18 MED ORDER — SODIUM CHLORIDE 0.9 % IV SOLN: INTRAVENOUS | Status: DC

## 2023-07-18 MED ORDER — MIDAZOLAM HCL 2 MG/ML PO SYRP: 8.0000 mg | ORAL_SOLUTION | Freq: Once | ORAL | Status: DC | PRN

## 2023-07-18 MED ORDER — CEFAZOLIN SODIUM-DEXTROSE 2-4 GM/100ML-% IV SOLN
2.0000 g | INTRAVENOUS | Status: AC
  Administered 2023-07-18: 2 g via INTRAVENOUS

## 2023-07-18 SURGICAL SUPPLY — 7 items
COVER PROBE ULTRASOUND 5X96 (MISCELLANEOUS) IMPLANT
KIT FEM OPTION ELITE FILTER (Filter) IMPLANT
NDL ENTRY 21GA 7CM ECHOTIP (NEEDLE) IMPLANT
NEEDLE ENTRY 21GA 7CM ECHOTIP (NEEDLE) ×1
PACK ANGIOGRAPHY (CUSTOM PROCEDURE TRAY) ×1 IMPLANT
SET INTRO CAPELLA COAXIAL (SET/KITS/TRAYS/PACK) IMPLANT
WIRE SUPRACORE 190CM (WIRE) IMPLANT

## 2023-07-18 NOTE — H&P (View-Only) (Signed)
MRN : [id number]  Dana Bautista is a 77 y.o. (1946/07/15) female who presents with chief complaint of legs hurt and swell.  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  DVT was identified years ago and was treated with anticoagulation.  The presenting symptoms were pain and swelling in the lower extremity.  Patient has a history of IVC filter placement prior to a hip surgery which was subsequently removed.  The patient notes minimal edema in the morning which increases to some degree throughout the day.    The patient has not been using compression therapy at this point.  No SOB or pleuritic chest pains.  No cough or hemoptysis.  The patient is currently on half dose Eliquis anticoagulation. No blood per rectum or blood in any sputum.  No excessive bruising per the patient.    No recent shortening of the patient's walking distance or new symptoms consistent with claudication.  No history of rest pain symptoms. No new ulcers or wounds of the lower extremities have occurred.  The patient denies amaurosis fugax or recent TIA symptoms. There are no recent neurological changes noted. No recent episodes of angina or shortness of breath documented.   Current Meds  Medication Sig   acetaminophen (TYLENOL) 500 MG tablet Take 500-1,000 mg by mouth every 6 (six) hours as needed for mild pain or moderate pain.   alendronate (FOSAMAX) 70 MG/75ML solution Take 70 mg by mouth every 7 (seven) days. Take with a full glass of water on an empty stomach.   Cholecalciferol (D-3-5) 125 MCG (5000 UT) capsule Take 5,000 Units by mouth daily.   cyanocobalamin (,VITAMIN B-12,) 1000 MCG/ML injection Inject 1,000 mcg into the muscle every 30 (thirty) days.   Multiple Vitamins-Minerals (MULTIVITAMIN WITH MINERALS) tablet Take 2 tablets by mouth daily. Gummy   nitrofurantoin, macrocrystal-monohydrate,  (MACROBID) 100 MG capsule Take 500 mg by mouth 2 (two) times daily.   tiZANidine (ZANAFLEX) 2 MG tablet Take 2 mg by mouth 3 (three) times daily.   tolterodine (DETROL LA) 4 MG 24 hr capsule Take 1 capsule (4 mg total) by mouth daily.    Past Medical History:  Diagnosis Date   Anemia    Arthritis    osteoarthriist   Cancer (HCC)    skin   Carotid stenosis, asymptomatic, bilateral    Cellulitis    Complication of anesthesia    nausea   DVT (deep venous thrombosis) (HCC) 09/2019   Heart murmur    History of methicillin resistant staphylococcus aureus (MRSA) 2020   Mitral valve regurgitation    Pericardial effusion    Pleural effusion    PONV (postoperative nausea and vomiting)    Pre-diabetes    Pulmonary embolism (HCC) 2020   Sleep apnea    uses cpap   Tendonitis    outer aspect of right foot   Thyroid nodule    UTI (lower urinary tract infection)    Vitamin B 12 deficiency     Past Surgical History:  Procedure Laterality Date   ABDOMINAL HYSTERECTOMY     APPENDECTOMY     COLONOSCOPY     FRACTURE SURGERY     left wrist   FRACTURE SURGERY Left    wrist   INCISION AND DRAINAGE Right 06/03/2021   Procedure: INCISION AND DRAINAGE-Right Index Finger;  Surgeon: Christena Flake, MD;  Location: [facility]  ORS;  Service: Orthopedics;  Laterality: Right;   IVC FILTER INSERTION N/A 03/15/2022   Procedure: IVC FILTER INSERTION;  Surgeon: Renford Dills, MD;  Location: [facility] INVASIVE CV LAB;  Service: Cardiovascular;  Laterality: N/A;   IVC FILTER REMOVAL N/A 05/31/2022   Procedure: IVC FILTER REMOVAL;  Surgeon: Renford Dills, MD;  Location: [facility] INVASIVE CV LAB;  Service: Cardiovascular;  Laterality: N/A;   KNEE SURGERY Left    debridement   TONSILLECTOMY     TOTAL HIP ARTHROPLASTY Right 03/22/2022   Procedure: TOTAL HIP ARTHROPLASTY;  Surgeon: Christena Flake, MD;  Location: [facility] ORS;  Service: Orthopedics;  Laterality: Right;    Social History Social History   Tobacco Use    Smoking status: Never    Passive exposure: Never   Smokeless tobacco: Never  Vaping Use   Vaping status: Never Used  Substance Use Topics   Alcohol use: No   Drug use: No    Family History Family History  Problem Relation Age of Onset   Breast cancer Mother    Asthma Mother    Lung cancer Father    Heart disease Brother    Heart disease Maternal Grandfather    Throat cancer Maternal Grandfather     Allergies  Allergen Reactions   Codeine Nausea And Vomiting   Elemental Sulfur Other (See Comments)    Lip swelling, difficulty breathing   Gabapentin Nausea And Vomiting   Levaquin [Levofloxacin] Hives   Penicillins Hives and Other (See Comments)    Did it involve swelling of the face/tongue/throat, SOB, or low BP? Unknown Did it involve sudden or severe rash/hives, skin peeling, or any reaction on the inside of your mouth or nose? Yes Did you need to seek medical attention at a hospital or doctor's office? Yes When did it last happen? many years If all above answers are "NO", may proceed with cephalosporin use.    Sulfa Antibiotics Other (See Comments)    Other reaction(s): lip swelling   Latex Rash    Peeling skin   Sulfur Other (See Comments) and Swelling    Lip swelling    Lip swelling, difficulty breathing     REVIEW OF SYSTEMS (Negative unless checked)  Constitutional: [] Weight loss  [] Fever  [] Chills Cardiac: [] Chest pain   [] Chest pressure   [] Palpitations   [] Shortness of breath when laying flat   [] Shortness of breath with exertion. Vascular:  [] Pain in legs with walking   [x] Pain in legs at rest  [] History of DVT   [] Phlebitis   [x] Swelling in legs   [] Varicose veins   [] Non-healing ulcers Pulmonary:   [] Uses home oxygen   [] Productive cough   [] Hemoptysis   [] Wheeze  [] COPD   [] Asthma Neurologic:  [] Dizziness   [] Seizures   [] History of stroke   [] History of TIA  [] Aphasia   [] Vissual changes   [] Weakness or numbness in arm   [] Weakness or numbness in  leg Musculoskeletal:   [] Joint swelling   [] Joint pain   [] Low back pain Hematologic:  [] Easy bruising  [] Easy bleeding   [] Hypercoagulable state   [] Anemic Gastrointestinal:  [] Diarrhea   [] Vomiting  [] Gastroesophageal reflux/heartburn   [] Difficulty swallowing. Genitourinary:  [] Chronic kidney disease   [] Difficult urination  [] Frequent urination   [] Blood in urine Skin:  [] Rashes   [] Ulcers  Psychological:  [] History of anxiety   []  History of major depression.  Physical Examination  There were no vitals filed for this visit. There is no height or  weight on file to calculate BMI. Gen: WD/WN, NAD Head: [place]/AT, No temporalis wasting.  Ear/Nose/Throat: Hearing grossly intact, nares w/o erythema or drainage, pinna without lesions Eyes: PER, EOMI, sclera nonicteric.  Neck: Supple, no gross masses.  No JVD.  Pulmonary:  Good air movement, no audible wheezing, no use of accessory muscles.  Cardiac: RRR, precordium not hyperdynamic. Vascular:  scattered varicosities present bilaterally.  Moderate venous stasis changes to the legs bilaterally.  1+ soft pitting edema. CEAP [id number]   Vessel Right Left  Radial Palpable Palpable  Gastrointestinal: soft, non-distended. No guarding/no peritoneal signs.  Musculoskeletal: M/S 5/5 throughout.  No deformity.  Neurologic: CN 2-12 intact. Pain and light touch intact in extremities.  Symmetrical.  Speech is fluent. Motor exam as listed above. Psychiatric: Judgment intact, Mood & affect appropriate for pt's clinical situation. Dermatologic: Venous rashes no ulcers noted.  No changes consistent with cellulitis. Lymph : No lichenification or skin changes of chronic lymphedema.  CBC Lab Results  Component Value Date   WBC 5.7 01/25/2023   HGB 11.4 (L) 01/25/2023   HCT 35.4 (L) 01/25/2023   MCV 88.5 01/25/2023   PLT 241 01/25/2023    BMET    Component Value Date/Time   NA 133 (L) 01/25/2023 1000   K 3.7 01/25/2023 1000   CL 102 01/25/2023 1000    CO2 25 01/25/2023 1000   GLUCOSE 93 01/25/2023 1000   BUN 17 01/25/2023 1000   CREATININE 0.58 01/25/2023 1000   CALCIUM 8.8 (L) 01/25/2023 1000   GFRNONAA >60 01/25/2023 1000   GFRAA >60 05/01/2020 1102   CrCl cannot be calculated (Patient's most recent lab result is older than the maximum 21 days allowed.).  COAG Lab Results  Component Value Date   INR 1.1 10/05/2019   INR 1.0 10/04/2019    Radiology VAS [place] LOWER EXTREMITY VENOUS REFLUX  Result Date: 07/12/2023  Lower Venous Reflux Study Patient Name:  Dana Bautista    Date of Exam:   07/12/2023 Medical Rec #: [id number]   Accession #:    [id number] Date of Birth: 1946-07-25  Patient Gender: F Patient Age:   73 years Exam Location:  [facility] Procedure:      VAS [place] LOWER EXTREMITY VENOUS REFLUX Referring Phys: Sheppard Plumber --------------------------------------------------------------------------------  Indications: Varicosities. Other Indications: Bilat ablations, multiple sclero treatments. Performing Technologist: Salvadore Farber RVT  Examination Guidelines: A complete evaluation includes B-mode imaging, spectral Doppler, color Doppler, and power Doppler as needed of all accessible portions of each vessel. Bilateral testing is considered an integral part of a complete examination. Limited examinations for reoccurring indications may be performed as noted. The reflux portion of the exam is performed with the patient in reverse Trendelenburg. Significant venous reflux is defined as >500 ms in the superficial venous system, and >1 second in the deep venous system.   Summary: Right: - No evidence of deep vein thrombosis seen in the right lower extremity, from the common femoral through the popliteal veins. - No evidence of superficial venous thrombosis in the right lower extremity. - There is no evidence of venous reflux seen in the right lower extremity. - No evidence of superficial venous reflux seen in the right greater saphenous  vein. - No evidence of superficial venous reflux seen in the right short saphenous vein. - GSV ablated  *See table(s) above for measurements and observations. Electronically signed by Festus Barren MD on 07/12/2023 at 9:58:43 AM.    Final      Assessment/Plan  1.  Hypercoagulable state with history of DVT:  Recommend:  The patient will continue anticoagulation for now as there have not been any problems or complications secondary to this therapy at this point.  IVC filter insertion is indicated prior to high risk orthopedic surgery.  This is especially true given the history of PE associated with the past DVT.  IVC filter placement will be done the week for surgery. Risk and benefits were reviewed with the patient.  Indications for the procedure were discussed.  All questions were answered, the patient agrees to proceed with IVC filter insertion.   I have reviewed with the patient DVT and post phlebitic changes such as swelling and why it  causes symptoms such as pain.  I recommended to the patient to wear graduated compression stockings, beginning after three full days of anticoagulation.  Graduated compression should be worn on a daily basis. The patient should wear compression beginning first thing in the morning and removing them in the evening. The patient is instructed specifically not to sleep in the stockings.  In addition, behavioral modification including elevation during the day and avoidance of prolonged dependency will be initiated.    The patient will follow-up with me in 1 month after the joint replacement surgery to discuss removal (this was also discussed today and the patient agrees with the plan to have the filter removed).    2.  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  IVC filter will be placed prior  to the knee replacement surgery.  Plans will be for removal approximately 30 days postop.  Continued activity and therapy was stressed.  3.  Gastroesophageal GL reflux disease: Continue PPI as already ordered, this medication has been reviewed and there are no changes at this time.  Avoidence of caffeine and alcohol  Moderate elevation of the head of the bed    [name], MD  07/18/2023 7:57 AM

## 2023-07-18 NOTE — Op Note (Signed)
[facility]   OPERATIVE NOTE    PRE-OPERATIVE DIAGNOSIS: DVT with PE  POST-OPERATIVE DIAGNOSIS: Same  PROCEDURE: 1.   Ultrasound guidance for vascular access to the right common femoral vein 2.   Catheter placement into the inferior vena cava 3.   Inferior venacavogram 4.   Placement of a option Elite IVC filter  SURGEON: [name]  ASSISTANT(S): None  ANESTHESIA: Conscious sedation was administered by the interventional radiology RN under my direct supervision. IV Versed plus fentanyl were utilized. Continuous ECG, pulse oximetry and blood pressure was monitored throughout the entire procedure. Conscious sedation was for a total of 20 minutes.  ESTIMATED BLOOD LOSS: minimal  FINDING(S): 1.  Patent IVC  SPECIMEN(S):  none  INDICATIONS:   Dana Bautista is a 77 y.o. y.o. female who presents with upcoming total knee replacement surgery.  She has a history of DVT in the past as well as documented hypercoagulable state.  She will require cessation of her anticoagulation therapy prior to knee surgery.  Inferior vena cava filter is indicated for this reason.  Risks and benefits including filter thrombosis, migration, fracture, bleeding, and infection were all discussed.  We discussed that all IVC filters that we place can be removed if desired from the patient once the need for the filter has passed.    DESCRIPTION: After obtaining full informed written consent, the patient was brought back to the vascular suite. The skin was sterilely prepped and draped in a sterile surgical field was created. Ultrasound was placed in a sterile sleeve. The right common femoral vein was echolucent and compressible indicating patency. Image was recorded for the permanent record. The puncture was made under continuous real-time ultrasound guidance.  The right common femoral vein was accessed under direct ultrasound guidance without difficulty with a micropuncture needle. Microwire was  then advanced under fluoroscopic guidance without difficulty. Micro-sheath was then inserted and a J-wire was then placed. The dilator is passed over the wire and the delivery sheath was placed into the inferior vena cava.  Inferior venacavogram was performed. This demonstrated a patent IVC with the level of the renal veins at L1-L2 level.  Vena cava is measured and it is 21 mm in diameter the filter was then deployed into the inferior vena cava at the level of L1-L2 disc space just below the renal veins. The delivery sheath was then removed. Pressure was held. Sterile dressings were placed. The patient tolerated the procedure well and was taken to the recovery room in stable condition.  Interpretation: IVC is widely patent.  It measures 21 mm in diameter.  IVC filter is deployed in good orientation  COMPLICATIONS: None  CONDITION: Stable  [name]  07/18/2023, 8:42 AM

## 2023-07-18 NOTE — Interval H&P Note (Signed)
History and Physical Interval Note:  07/18/2023 8:12 AM  Dana Bautista  has presented today for surgery, with the diagnosis of IVC filter insertion   DVT.  The various methods of treatment have been discussed with the patient and family. After consideration of risks, benefits and other options for treatment, the patient has consented to  Procedure(s): IVC FILTER INSERTION (N/A) as a surgical intervention.  The patient's history has been reviewed, patient examined, no change in status, stable for surgery.  I have reviewed the patient's chart and labs.  Questions were answered to the patient's satisfaction.     [name]

## 2023-07-18 NOTE — Progress Notes (Signed)
MRN : [id number]  Dana Bautista is a 77 y.o. (1946/07/15) female who presents with chief complaint of legs hurt and swell.  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  DVT was identified years ago and was treated with anticoagulation.  The presenting symptoms were pain and swelling in the lower extremity.  Patient has a history of IVC filter placement prior to a hip surgery which was subsequently removed.  The patient notes minimal edema in the morning which increases to some degree throughout the day.    The patient has not been using compression therapy at this point.  No SOB or pleuritic chest pains.  No cough or hemoptysis.  The patient is currently on half dose Eliquis anticoagulation. No blood per rectum or blood in any sputum.  No excessive bruising per the patient.    No recent shortening of the patient's walking distance or new symptoms consistent with claudication.  No history of rest pain symptoms. No new ulcers or wounds of the lower extremities have occurred.  The patient denies amaurosis fugax or recent TIA symptoms. There are no recent neurological changes noted. No recent episodes of angina or shortness of breath documented.   Current Meds  Medication Sig   acetaminophen (TYLENOL) 500 MG tablet Take 500-1,000 mg by mouth every 6 (six) hours as needed for mild pain or moderate pain.   alendronate (FOSAMAX) 70 MG/75ML solution Take 70 mg by mouth every 7 (seven) days. Take with a full glass of water on an empty stomach.   Cholecalciferol (D-3-5) 125 MCG (5000 UT) capsule Take 5,000 Units by mouth daily.   cyanocobalamin (,VITAMIN B-12,) 1000 MCG/ML injection Inject 1,000 mcg into the muscle every 30 (thirty) days.   Multiple Vitamins-Minerals (MULTIVITAMIN WITH MINERALS) tablet Take 2 tablets by mouth daily. Gummy   nitrofurantoin, macrocrystal-monohydrate,  (MACROBID) 100 MG capsule Take 500 mg by mouth 2 (two) times daily.   tiZANidine (ZANAFLEX) 2 MG tablet Take 2 mg by mouth 3 (three) times daily.   tolterodine (DETROL LA) 4 MG 24 hr capsule Take 1 capsule (4 mg total) by mouth daily.    Past Medical History:  Diagnosis Date   Anemia    Arthritis    osteoarthriist   Cancer (HCC)    skin   Carotid stenosis, asymptomatic, bilateral    Cellulitis    Complication of anesthesia    nausea   DVT (deep venous thrombosis) (HCC) 09/2019   Heart murmur    History of methicillin resistant staphylococcus aureus (MRSA) 2020   Mitral valve regurgitation    Pericardial effusion    Pleural effusion    PONV (postoperative nausea and vomiting)    Pre-diabetes    Pulmonary embolism (HCC) 2020   Sleep apnea    uses cpap   Tendonitis    outer aspect of right foot   Thyroid nodule    UTI (lower urinary tract infection)    Vitamin B 12 deficiency     Past Surgical History:  Procedure Laterality Date   ABDOMINAL HYSTERECTOMY     APPENDECTOMY     COLONOSCOPY     FRACTURE SURGERY     left wrist   FRACTURE SURGERY Left    wrist   INCISION AND DRAINAGE Right 06/03/2021   Procedure: INCISION AND DRAINAGE-Right Index Finger;  Surgeon: Christena Flake, MD;  Location: [facility]  ORS;  Service: Orthopedics;  Laterality: Right;   IVC FILTER INSERTION N/A 03/15/2022   Procedure: IVC FILTER INSERTION;  Surgeon: Renford Dills, MD;  Location: [facility] INVASIVE CV LAB;  Service: Cardiovascular;  Laterality: N/A;   IVC FILTER REMOVAL N/A 05/31/2022   Procedure: IVC FILTER REMOVAL;  Surgeon: Renford Dills, MD;  Location: [facility] INVASIVE CV LAB;  Service: Cardiovascular;  Laterality: N/A;   KNEE SURGERY Left    debridement   TONSILLECTOMY     TOTAL HIP ARTHROPLASTY Right 03/22/2022   Procedure: TOTAL HIP ARTHROPLASTY;  Surgeon: Christena Flake, MD;  Location: [facility] ORS;  Service: Orthopedics;  Laterality: Right;    Social History Social History   Tobacco Use    Smoking status: Never    Passive exposure: Never   Smokeless tobacco: Never  Vaping Use   Vaping status: Never Used  Substance Use Topics   Alcohol use: No   Drug use: No    Family History Family History  Problem Relation Age of Onset   Breast cancer Mother    Asthma Mother    Lung cancer Father    Heart disease Brother    Heart disease Maternal Grandfather    Throat cancer Maternal Grandfather     Allergies  Allergen Reactions   Codeine Nausea And Vomiting   Elemental Sulfur Other (See Comments)    Lip swelling, difficulty breathing   Gabapentin Nausea And Vomiting   Levaquin [Levofloxacin] Hives   Penicillins Hives and Other (See Comments)    Did it involve swelling of the face/tongue/throat, SOB, or low BP? Unknown Did it involve sudden or severe rash/hives, skin peeling, or any reaction on the inside of your mouth or nose? Yes Did you need to seek medical attention at a hospital or doctor's office? Yes When did it last happen? many years If all above answers are "NO", may proceed with cephalosporin use.    Sulfa Antibiotics Other (See Comments)    Other reaction(s): lip swelling   Latex Rash    Peeling skin   Sulfur Other (See Comments) and Swelling    Lip swelling    Lip swelling, difficulty breathing     REVIEW OF SYSTEMS (Negative unless checked)  Constitutional: [] Weight loss  [] Fever  [] Chills Cardiac: [] Chest pain   [] Chest pressure   [] Palpitations   [] Shortness of breath when laying flat   [] Shortness of breath with exertion. Vascular:  [] Pain in legs with walking   [x] Pain in legs at rest  [] History of DVT   [] Phlebitis   [x] Swelling in legs   [] Varicose veins   [] Non-healing ulcers Pulmonary:   [] Uses home oxygen   [] Productive cough   [] Hemoptysis   [] Wheeze  [] COPD   [] Asthma Neurologic:  [] Dizziness   [] Seizures   [] History of stroke   [] History of TIA  [] Aphasia   [] Vissual changes   [] Weakness or numbness in arm   [] Weakness or numbness in  leg Musculoskeletal:   [] Joint swelling   [] Joint pain   [] Low back pain Hematologic:  [] Easy bruising  [] Easy bleeding   [] Hypercoagulable state   [] Anemic Gastrointestinal:  [] Diarrhea   [] Vomiting  [] Gastroesophageal reflux/heartburn   [] Difficulty swallowing. Genitourinary:  [] Chronic kidney disease   [] Difficult urination  [] Frequent urination   [] Blood in urine Skin:  [] Rashes   [] Ulcers  Psychological:  [] History of anxiety   []  History of major depression.  Physical Examination  There were no vitals filed for this visit. There is no height or  weight on file to calculate BMI. Gen: WD/WN, NAD Head: [place]/AT, No temporalis wasting.  Ear/Nose/Throat: Hearing grossly intact, nares w/o erythema or drainage, pinna without lesions Eyes: PER, EOMI, sclera nonicteric.  Neck: Supple, no gross masses.  No JVD.  Pulmonary:  Good air movement, no audible wheezing, no use of accessory muscles.  Cardiac: RRR, precordium not hyperdynamic. Vascular:  scattered varicosities present bilaterally.  Moderate venous stasis changes to the legs bilaterally.  1+ soft pitting edema. CEAP [id number]   Vessel Right Left  Radial Palpable Palpable  Gastrointestinal: soft, non-distended. No guarding/no peritoneal signs.  Musculoskeletal: M/S 5/5 throughout.  No deformity.  Neurologic: CN 2-12 intact. Pain and light touch intact in extremities.  Symmetrical.  Speech is fluent. Motor exam as listed above. Psychiatric: Judgment intact, Mood & affect appropriate for pt's clinical situation. Dermatologic: Venous rashes no ulcers noted.  No changes consistent with cellulitis. Lymph : No lichenification or skin changes of chronic lymphedema.  CBC Lab Results  Component Value Date   WBC 5.7 01/25/2023   HGB 11.4 (L) 01/25/2023   HCT 35.4 (L) 01/25/2023   MCV 88.5 01/25/2023   PLT 241 01/25/2023    BMET    Component Value Date/Time   NA 133 (L) 01/25/2023 1000   K 3.7 01/25/2023 1000   CL 102 01/25/2023 1000    CO2 25 01/25/2023 1000   GLUCOSE 93 01/25/2023 1000   BUN 17 01/25/2023 1000   CREATININE 0.58 01/25/2023 1000   CALCIUM 8.8 (L) 01/25/2023 1000   GFRNONAA >60 01/25/2023 1000   GFRAA >60 05/01/2020 1102   CrCl cannot be calculated (Patient's most recent lab result is older than the maximum 21 days allowed.).  COAG Lab Results  Component Value Date   INR 1.1 10/05/2019   INR 1.0 10/04/2019    Radiology VAS [place] LOWER EXTREMITY VENOUS REFLUX  Result Date: 07/12/2023  Lower Venous Reflux Study Patient Name:  HADASA DELLAROCCO    Date of Exam:   07/12/2023 Medical Rec #: [id number]   Accession #:    [id number] Date of Birth: 1946-07-25  Patient Gender: F Patient Age:   73 years Exam Location:  [facility] Procedure:      VAS [place] LOWER EXTREMITY VENOUS REFLUX Referring Phys: Sheppard Plumber --------------------------------------------------------------------------------  Indications: Varicosities. Other Indications: Bilat ablations, multiple sclero treatments. Performing Technologist: Salvadore Farber RVT  Examination Guidelines: A complete evaluation includes B-mode imaging, spectral Doppler, color Doppler, and power Doppler as needed of all accessible portions of each vessel. Bilateral testing is considered an integral part of a complete examination. Limited examinations for reoccurring indications may be performed as noted. The reflux portion of the exam is performed with the patient in reverse Trendelenburg. Significant venous reflux is defined as >500 ms in the superficial venous system, and >1 second in the deep venous system.   Summary: Right: - No evidence of deep vein thrombosis seen in the right lower extremity, from the common femoral through the popliteal veins. - No evidence of superficial venous thrombosis in the right lower extremity. - There is no evidence of venous reflux seen in the right lower extremity. - No evidence of superficial venous reflux seen in the right greater saphenous  vein. - No evidence of superficial venous reflux seen in the right short saphenous vein. - GSV ablated  *See table(s) above for measurements and observations. Electronically signed by Festus Barren MD on 07/12/2023 at 9:58:43 AM.    Final      Assessment/Plan  1.  Hypercoagulable state with history of DVT:  Recommend:  The patient will continue anticoagulation for now as there have not been any problems or complications secondary to this therapy at this point.  IVC filter insertion is indicated prior to high risk orthopedic surgery.  This is especially true given the history of PE associated with the past DVT.  IVC filter placement will be done the week for surgery. Risk and benefits were reviewed with the patient.  Indications for the procedure were discussed.  All questions were answered, the patient agrees to proceed with IVC filter insertion.   I have reviewed with the patient DVT and post phlebitic changes such as swelling and why it  causes symptoms such as pain.  I recommended to the patient to wear graduated compression stockings, beginning after three full days of anticoagulation.  Graduated compression should be worn on a daily basis. The patient should wear compression beginning first thing in the morning and removing them in the evening. The patient is instructed specifically not to sleep in the stockings.  In addition, behavioral modification including elevation during the day and avoidance of prolonged dependency will be initiated.    The patient will follow-up with me in 1 month after the joint replacement surgery to discuss removal (this was also discussed today and the patient agrees with the plan to have the filter removed).    2.  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  IVC filter will be placed prior  to the knee replacement surgery.  Plans will be for removal approximately 30 days postop.  Continued activity and therapy was stressed.  3.  Gastroesophageal GL reflux disease: Continue PPI as already ordered, this medication has been reviewed and there are no changes at this time.  Avoidence of caffeine and alcohol  Moderate elevation of the head of the bed    [name], MD  07/18/2023 7:57 AM

## 2023-07-20 ENCOUNTER — Encounter
Admission: RE | Admit: 2023-07-20 | Discharge: 2023-07-20 | Disposition: A | Discharge status: Home / Self Care | Payer: Medicare Other | Source: Ambulatory Visit | Attending: Surgery | Admitting: Surgery

## 2023-07-20 ENCOUNTER — Other Ambulatory Visit: Payer: Self-pay | Admitting: Surgery

## 2023-07-20 ENCOUNTER — Other Ambulatory Visit: Payer: Medicare Other

## 2023-07-20 ENCOUNTER — Encounter: Payer: Self-pay | Admitting: Oncology

## 2023-07-20 VITALS — BP 86/52 | HR 71 | Temp 97.9°F | Resp 20

## 2023-07-20 DIAGNOSIS — M1711 Unilateral primary osteoarthritis, right knee: Secondary | ICD-10-CM | POA: Insufficient documentation

## 2023-07-20 DIAGNOSIS — Z01818 Encounter for other preprocedural examination: Secondary | ICD-10-CM | POA: Insufficient documentation

## 2023-07-20 DIAGNOSIS — Z01812 Encounter for preprocedural laboratory examination: Secondary | ICD-10-CM

## 2023-07-20 HISTORY — DX: Nonrheumatic mitral (valve) prolapse: I34.1

## 2023-07-20 HISTORY — DX: Unilateral primary osteoarthritis, right knee: M17.11

## 2023-07-20 LAB — COMPREHENSIVE METABOLIC PANEL
ALT: 12 U/L (ref 0–44)
AST: 17 U/L (ref 15–41)
Albumin: 4.3 g/dL (ref 3.5–5.0)
Alkaline Phosphatase: 47 U/L (ref 38–126)
Anion gap: 9 (ref 5–15)
BUN: 16 mg/dL (ref 8–23)
CO2: 25 mmol/L (ref 22–32)
Calcium: 9.2 mg/dL (ref 8.9–10.3)
Chloride: 101 mmol/L (ref 98–111)
Creatinine, Ser: 0.59 mg/dL (ref 0.44–1.00)
GFR, Estimated: >60 mL/min (ref >60)
Glucose, Bld: 82 mg/dL (ref 70–99)
Potassium: 4.1 mmol/L (ref 3.5–5.1)
Sodium: 135 mmol/L (ref 135–145)
Total Bilirubin: 1 mg/dL (ref 0.3–1.2)
Total Protein: 7 g/dL (ref 6.5–8.1)

## 2023-07-20 LAB — URINALYSIS, ROUTINE W REFLEX MICROSCOPIC
Bacteria, UA: NONE SEEN
Bilirubin Urine: NEGATIVE
Glucose, UA: NEGATIVE mg/dL
Hgb urine dipstick: NEGATIVE
Ketones, ur: NEGATIVE mg/dL
Nitrite: NEGATIVE
Protein, ur: NEGATIVE mg/dL
Specific Gravity, Urine: 1.012 (ref 1.005–1.030)
pH: 5 (ref 5.0–8.0)

## 2023-07-20 LAB — CBC WITH DIFFERENTIAL/PLATELET
Abs Immature Granulocytes: 0.02 10*3/uL (ref 0.00–0.07)
Basophils Absolute: 0 10*3/uL (ref 0.0–0.1)
Basophils Relative: 1 %
Eosinophils Absolute: 0.1 10*3/uL (ref 0.0–0.5)
Eosinophils Relative: 1 %
HCT: 36.1 % (ref 36.0–46.0)
Hemoglobin: 11.9 g/dL — ABNORMAL LOW (ref 12.0–15.0)
Immature Granulocytes: 0 %
Lymphocytes Relative: 40 %
Lymphs Abs: 2.3 10*3/uL (ref 0.7–4.0)
MCH: 28.8 pg (ref 26.0–34.0)
MCHC: 33 g/dL (ref 30.0–36.0)
MCV: 87.4 fL (ref 80.0–100.0)
Monocytes Absolute: 0.5 10*3/uL (ref 0.1–1.0)
Monocytes Relative: 9 %
Neutro Abs: 2.8 10*3/uL (ref 1.7–7.7)
Neutrophils Relative %: 49 %
Platelets: 293 10*3/uL (ref 150–400)
RBC: 4.13 MIL/uL (ref 3.87–5.11)
RDW: 12.8 % (ref 11.5–15.5)
WBC: 5.8 10*3/uL (ref 4.0–10.5)
nRBC: 0 % (ref 0.0–0.2)

## 2023-07-20 LAB — SURGICAL PCR SCREEN
MRSA, PCR: NEGATIVE
Staphylococcus aureus: NEGATIVE

## 2023-07-20 NOTE — Patient Instructions (Addendum)
Your procedure is scheduled on: 07/27/2023 Thursday  Report to the Registration Desk on the 1st floor of the [facility]. To find out your arrival time, please call [phone] between 1PM - 3PM on: 10/30/ 2024  If your arrival time is 6:00 am, do not arrive before that time as the [facility] entrance doors do not open until 6:00 am.  REMEMBER: Instructions that are not followed completely may result in serious medical risk, up to and including death; or upon the discretion of your surgeon and anesthesiologist your surgery may need to be rescheduled.  Do not eat food after midnight the night before surgery.  No gum chewing or hard candies.  You may however, drink CLEAR liquids up to 2 hours before you are scheduled to arrive for your surgery. Do not drink anything within 2 hours of your scheduled arrival time.  Clear liquids include: - water  - apple juice without pulp - gatorade (not RED colors) - black coffee or tea (Do NOT add milk or creamers to the coffee or tea) Do NOT drink anything that is not on this list.    In addition, your doctor has ordered for you to drink the provided:  Ensure Pre-Surgery Clear Carbohydrate Drink   Drinking this carbohydrate drink up to two hours before surgery helps to reduce insulin resistance and improve patient outcomes. Please complete drinking 2 hours before scheduled arrival time.  One week prior to surgery: Stop Anti-inflammatories (NSAIDS) such as Advil, Aleve, Ibuprofen, Motrin, Naproxen, Naprosyn and Aspirin based products such as Excedrin, Goody's Powder, BC Powder. Stop ANY OVER THE COUNTER supplements until after surgery.  You may however, continue to take Tylenol if needed for pain up until the day of surgery.   Continue taking all of your other prescription medications up until the day of surgery.  ON THE DAY OF SURGERY ONLY TAKE THESE MEDICATIONS WITH SIPS OF WATER:  Detrol   No Alcohol for 24 hours before or after  surgery.  No Smoking including e-cigarettes for 24 hours before surgery.  No chewable tobacco products for at least 6 hours before surgery.  No nicotine patches on the day of surgery.  Do not use any "recreational" drugs for at least a week (preferably 2 weeks) before your surgery.  Please be advised that the combination of cocaine and anesthesia may have negative outcomes, up to and including death. If you test positive for cocaine, your surgery will be cancelled.  On the morning of surgery brush your teeth with toothpaste and water, you may rinse your mouth with mouthwash if you wish. Do not swallow any toothpaste or mouthwash.  Use CHG Soap or wipes as directed on instruction sheet.  Do not wear jewelry, make-up, hairpins, clips or nail polish.  For welded (permanent) jewelry: bracelets, anklets, waist bands, etc.  Please have this removed prior to surgery.  If it is not removed, there is a chance that hospital personnel will need to cut it off on the day of surgery.  Do not wear lotions, powders, or perfumes.   Do not shave body hair from the neck down 48 hours before surgery.  Contact lenses, hearing aids and dentures may not be worn into surgery.  Do not bring valuables to the hospital. [facility] is not responsible for any missing/lost belongings or valuables.     Notify your doctor if there is any change in your medical condition (cold, fever, infection).  Wear comfortable clothing (specific to your surgery type) to  the hospital.  After surgery, you can help prevent lung complications by doing breathing exercises.  Take deep breaths and cough every 1-2 hours. Your doctor may order a device called an Incentive Spirometer to help you take deep breaths.   If you are being admitted to the hospital overnight, leave your suitcase in the car. After surgery it may be brought to your room.  In case of increased patient census, it may be necessary for you, the patient, to  continue your postoperative care in the Same Day Surgery department.  If you are being discharged the day of surgery, you will not be allowed to drive home. You will need a responsible individual to drive you home and stay with you for 24 hours after surgery.     Please call the Pre-admissions Testing Dept. at [phone] if you have any questions about these instructions.  Surgery Visitation Policy:  Patients having surgery or a procedure may have two visitors.  Children under the age of 42 must have an adult with them who is not the patient.  Inpatient Visitation:    Visiting hours are 7 a.m. to 8 p.m. Up to four visitors are allowed at one time in a patient room. The visitors may rotate out with other people during the day.  One visitor age 70 or older may stay with the patient overnight and must be in the room by 8 p.m.      Pre-operative 5 CHG Bath Instructions   You can play a key role in reducing the risk of infection after surgery. Your skin needs to be as free of germs as possible. You can reduce the number of germs on your skin by washing with CHG (chlorhexidine gluconate) soap before surgery. CHG is an antiseptic soap that kills germs and continues to kill germs even after washing.   DO NOT use if you have an allergy to chlorhexidine/CHG or antibacterial soaps. If your skin becomes reddened or irritated, stop using the CHG and notify one of our RNs at [phone].   Please shower with the CHG soap starting 4 days before surgery using the following schedule:     Please keep in mind the following:  DO NOT shave, including legs and underarms, starting the day of your first shower.   You may shave your face at any point before/day of surgery.  Place clean sheets on your bed the day you start using CHG soap. Use a clean washcloth (not used since being washed) for each shower. DO NOT sleep with pets once you start using the CHG.   CHG Shower Instructions:  If you  choose to wash your hair and private area, wash first with your normal shampoo/soap.  After you use shampoo/soap, rinse your hair and body thoroughly to remove shampoo/soap residue.  Turn the water OFF and apply about 3 tablespoons (45 ml) of CHG soap to a CLEAN washcloth.  Apply CHG soap ONLY FROM YOUR NECK DOWN TO YOUR TOES (washing for 3-5 minutes)  DO NOT use CHG soap on face, private areas, open wounds, or sores.  Pay special attention to the area where your surgery is being performed.  If you are having back surgery, having someone wash your back for you may be helpful. Wait 2 minutes after CHG soap is applied, then you may rinse off the CHG soap.  Pat dry with a clean towel  Put on clean clothes/pajamas   If you choose to wear lotion, please use ONLY the CHG-compatible  lotions on the back of this paper.     Additional instructions for the day of surgery: DO NOT APPLY any lotions, deodorants, cologne, or perfumes.   Put on clean/comfortable clothes.  Brush your teeth.  Ask your nurse before applying any prescription medications to the skin.      CHG Compatible Lotions   Aveeno Moisturizing lotion  Cetaphil Moisturizing Cream  Cetaphil Moisturizing Lotion  Clairol Herbal Essence Moisturizing Lotion, Dry Skin  Clairol Herbal Essence Moisturizing Lotion, Extra Dry Skin  Clairol Herbal Essence Moisturizing Lotion, Normal Skin  Curel Age Defying Therapeutic Moisturizing Lotion with Alpha Hydroxy  Curel Extreme Care Body Lotion  Curel Soothing Hands Moisturizing Hand Lotion  Curel Therapeutic Moisturizing Cream, Fragrance-Free  Curel Therapeutic Moisturizing Lotion, Fragrance-Free  Curel Therapeutic Moisturizing Lotion, Original Formula  Eucerin Daily Replenishing Lotion  Eucerin Dry Skin Therapy Plus Alpha Hydroxy Crme  Eucerin Dry Skin Therapy Plus Alpha Hydroxy Lotion  Eucerin Original Crme  Eucerin Original Lotion  Eucerin Plus Crme Eucerin Plus Lotion  Eucerin  TriLipid Replenishing Lotion  Keri Anti-Bacterial Hand Lotion  Keri Deep Conditioning Original Lotion Dry Skin Formula Softly Scented  Keri Deep Conditioning Original Lotion, Fragrance Free Sensitive Skin Formula  Keri Lotion Fast Absorbing Fragrance Free Sensitive Skin Formula  Keri Lotion Fast Absorbing Softly Scented Dry Skin Formula  Keri Original Lotion  Keri Skin Renewal Lotion Keri Silky Smooth Lotion  Keri Silky Smooth Sensitive Skin Lotion  Nivea Body Creamy Conditioning Oil  Nivea Body Extra Enriched Lotion  Nivea Body Original Lotion  Nivea Body Sheer Moisturizing Lotion Nivea Crme  Nivea Skin Firming Lotion  NutraDerm 30 Skin Lotion  NutraDerm Skin Lotion  NutraDerm Therapeutic Skin Cream  NutraDerm Therapeutic Skin Lotion  ProShield Protective Hand Cream  Provon moisturizing lotion     Preoperative Educational Videos for Total Hip, Knee and Shoulder Replacements  To better prepare for surgery, please view our videos that explain the physical activity and discharge planning required to have the best surgical recovery at [facility].  [email or web address]  Questions? Call [phone] or email [email or web address]      How to Use an Incentive Spirometer  An incentive spirometer is a tool that measures how well you are filling your lungs with each breath. Learning to take long, deep breaths using this tool can help you keep your lungs clear and active. This may help to reverse or lessen your chance of developing breathing (pulmonary) problems, especially infection. You may be asked to use a spirometer: After a surgery. If you have a lung problem or a history of smoking. After a long period of time when you have been unable to move or be active. If the spirometer includes an indicator to show the highest number that you have reached, your health care provider or respiratory therapist will help you set a goal. Keep a log  of your progress as told by your health care provider. What are the risks? Breathing too quickly may cause dizziness or cause you to pass out. Take your time so you do not get dizzy or light-headed. If you are in pain, you may need to take pain medicine before doing incentive spirometry. It is harder to take a deep breath if you are having pain. How to use your incentive spirometer  Sit up on the edge of your bed or on a chair. Hold the incentive spirometer so that it is in an upright position. Before you use the spirometer, breathe out normally. Place the  mouthpiece in your mouth. Make sure your lips are closed tightly around it. Breathe in slowly and as deeply as you can through your mouth, causing the piston or the ball to rise toward the top of the chamber. Hold your breath for 3-5 seconds, or for as long as possible. If the spirometer includes a coach indicator, use this to guide you in breathing. Slow down your breathing if the indicator goes above the marked areas. Remove the mouthpiece from your mouth and breathe out normally. The piston or ball will return to the bottom of the chamber. Rest for a few seconds, then repeat the steps 10 or more times. Take your time and take a few normal breaths between deep breaths so that you do not get dizzy or light-headed. Do this every 1-2 hours when you are awake. If the spirometer includes a goal marker to show the highest number you have reached (best effort), use this as a goal to work toward during each repetition. After each set of 10 deep breaths, cough a few times. This will help to make sure that your lungs are clear. If you have an incision on your chest or abdomen from surgery, place a pillow or a rolled-up towel firmly against the incision when you cough. This can help to reduce pain while taking deep breaths and coughing. General tips When you are able to get out of bed: Walk around often. Continue to take deep breaths and cough in  order to clear your lungs. Keep using the incentive spirometer until your health care provider says it is okay to stop using it. If you have been in the hospital, you may be told to keep using the spirometer at home. Contact a health care provider if: You are having difficulty using the spirometer. You have trouble using the spirometer as often as instructed. Your pain medicine is not giving enough relief for you to use the spirometer as told. You have a fever. Get help right away if: You develop shortness of breath. You develop a cough with bloody mucus from the lungs. You have fluid or blood coming from an incision site after you cough. Summary An incentive spirometer is a tool that can help you learn to take long, deep breaths to keep your lungs clear and active. You may be asked to use a spirometer after a surgery, if you have a lung problem or a history of smoking, or if you have been inactive for a long period of time. Use your incentive spirometer as instructed every 1-2 hours while you are awake. If you have an incision on your chest or abdomen, place a pillow or a rolled-up towel firmly against your incision when you cough. This will help to reduce pain. Get help right away if you have shortness of breath, you cough up bloody mucus, or blood comes from your incision when you cough. This information is not intended to replace advice given to you by your health care provider. Make sure you discuss any questions you have with your health care provider. Document Revised: 12/02/2019 Document Reviewed: 12/02/2019 Elsevier Patient Education  2023 [employer].

## 2023-07-27 ENCOUNTER — Ambulatory Visit: Payer: Medicare Other | Admitting: Certified Registered"

## 2023-07-27 ENCOUNTER — Other Ambulatory Visit: Payer: Self-pay

## 2023-07-27 ENCOUNTER — Encounter: Payer: Self-pay | Admitting: Surgery

## 2023-07-27 ENCOUNTER — Encounter
Admission: RE | Disposition: A | Discharge status: Home / Self Care | Payer: Self-pay | Source: Ambulatory Visit | Attending: Surgery

## 2023-07-27 ENCOUNTER — Ambulatory Visit
Admission: RE | Admit: 2023-07-27 | Discharge: 2023-07-27 | Disposition: A | Discharge status: Home / Self Care | Payer: Medicare Other | Source: Ambulatory Visit | Attending: Surgery | Admitting: Surgery

## 2023-07-27 ENCOUNTER — Ambulatory Visit: Payer: Medicare Other

## 2023-07-27 DIAGNOSIS — I341 Nonrheumatic mitral (valve) prolapse: Secondary | ICD-10-CM | POA: Insufficient documentation

## 2023-07-27 DIAGNOSIS — M1711 Unilateral primary osteoarthritis, right knee: Secondary | ICD-10-CM | POA: Insufficient documentation

## 2023-07-27 DIAGNOSIS — Z8614 Personal history of Methicillin resistant Staphylococcus aureus infection: Secondary | ICD-10-CM | POA: Diagnosis not present

## 2023-07-27 DIAGNOSIS — Z86718 Personal history of other venous thrombosis and embolism: Secondary | ICD-10-CM | POA: Diagnosis not present

## 2023-07-27 DIAGNOSIS — S83281A Other tear of lateral meniscus, current injury, right knee, initial encounter: Secondary | ICD-10-CM | POA: Insufficient documentation

## 2023-07-27 DIAGNOSIS — Z7901 Long term (current) use of anticoagulants: Secondary | ICD-10-CM | POA: Insufficient documentation

## 2023-07-27 DIAGNOSIS — Z86711 Personal history of pulmonary embolism: Secondary | ICD-10-CM | POA: Diagnosis not present

## 2023-07-27 DIAGNOSIS — X58XXXA Exposure to other specified factors, initial encounter: Secondary | ICD-10-CM | POA: Diagnosis not present

## 2023-07-27 HISTORY — PX: TOTAL KNEE ARTHROPLASTY: SHX125

## 2023-07-27 SURGERY — ARTHROPLASTY, KNEE, TOTAL
Anesthesia: General | Site: Knee | Laterality: Right

## 2023-07-27 MED ORDER — EPINEPHRINE PF 1 MG/ML IJ SOLN
INTRAMUSCULAR | Status: AC
  Filled 2023-07-27: qty 1

## 2023-07-27 MED ORDER — FENTANYL CITRATE (PF) 100 MCG/2ML IJ SOLN
INTRAMUSCULAR | Status: AC
  Filled 2023-07-27: qty 2

## 2023-07-27 MED ORDER — APIXABAN 5 MG PO TABS: 5.0000 mg | ORAL_TABLET | Freq: Two times a day (BID) | ORAL | 0 refills | Status: DC

## 2023-07-27 MED ORDER — MIDAZOLAM HCL 2 MG/2ML IJ SOLN
INTRAMUSCULAR | Status: AC
  Filled 2023-07-27: qty 2

## 2023-07-27 MED ORDER — DEXAMETHASONE SODIUM PHOSPHATE 10 MG/ML IJ SOLN
INTRAMUSCULAR | Status: AC
  Filled 2023-07-27: qty 1

## 2023-07-27 MED ORDER — CEFAZOLIN SODIUM-DEXTROSE 2-4 GM/100ML-% IV SOLN
INTRAVENOUS | Status: AC
  Filled 2023-07-27: qty 100

## 2023-07-27 MED ORDER — ROCURONIUM BROMIDE 100 MG/10ML IV SOLN
INTRAVENOUS | Status: DC | PRN
  Administered 2023-07-27: 50 mg via INTRAVENOUS

## 2023-07-27 MED ORDER — OXYCODONE HCL 5 MG PO TABS: 5.0000 mg | ORAL_TABLET | ORAL | Status: DC | PRN

## 2023-07-27 MED ORDER — HYDROMORPHONE HCL 1 MG/ML IJ SOLN
0.2500 mg | INTRAMUSCULAR | Status: DC | PRN
  Administered 2023-07-27 (×4): 0.5 mg via INTRAVENOUS

## 2023-07-27 MED ORDER — HYDROMORPHONE HCL 1 MG/ML IJ SOLN
INTRAMUSCULAR | Status: AC
  Filled 2023-07-27: qty 1

## 2023-07-27 MED ORDER — ACETAMINOPHEN 10 MG/ML IV SOLN
INTRAVENOUS | Status: AC
  Filled 2023-07-27: qty 100

## 2023-07-27 MED ORDER — PROPOFOL 1000 MG/100ML IV EMUL
INTRAVENOUS | Status: AC
  Filled 2023-07-27: qty 100

## 2023-07-27 MED ORDER — ACETAMINOPHEN 10 MG/ML IV SOLN
INTRAVENOUS | Status: DC | PRN
  Administered 2023-07-27: 1000 mg via INTRAVENOUS

## 2023-07-27 MED ORDER — CEFAZOLIN SODIUM-DEXTROSE 2-4 GM/100ML-% IV SOLN
2.0000 g | Freq: Four times a day (QID) | INTRAVENOUS | Status: DC
Start: 2023-07-27 — End: 2023-07-27
  Administered 2023-07-27: 2 g via INTRAVENOUS

## 2023-07-27 MED ORDER — OXYCODONE HCL 5 MG PO TABS
ORAL_TABLET | ORAL | Status: AC
  Filled 2023-07-27: qty 1

## 2023-07-27 MED ORDER — LIDOCAINE HCL (CARDIAC) PF 100 MG/5ML IV SOSY
PREFILLED_SYRINGE | INTRAVENOUS | Status: DC | PRN
  Administered 2023-07-27: 50 mg via INTRAVENOUS

## 2023-07-27 MED ORDER — ONDANSETRON HCL 4 MG/2ML IJ SOLN
INTRAMUSCULAR | Status: AC
  Filled 2023-07-27: qty 2

## 2023-07-27 MED ORDER — HYDROMORPHONE HCL 1 MG/ML IJ SOLN
INTRAMUSCULAR | Status: DC | PRN
  Administered 2023-07-27 (×3): .2 mg via INTRAVENOUS
  Administered 2023-07-27: .4 mg via INTRAVENOUS

## 2023-07-27 MED ORDER — BUPIVACAINE LIPOSOME 1.3 % IJ SUSP
INTRAMUSCULAR | Status: AC
  Filled 2023-07-27: qty 20

## 2023-07-27 MED ORDER — CEFAZOLIN SODIUM-DEXTROSE 2-4 GM/100ML-% IV SOLN
2.0000 g | INTRAVENOUS | Status: AC
  Administered 2023-07-27: 2 g via INTRAVENOUS

## 2023-07-27 MED ORDER — TRIAMCINOLONE ACETONIDE 40 MG/ML IJ SUSP
INTRAMUSCULAR | Status: DC | PRN
  Administered 2023-07-27: 92.5 mL

## 2023-07-27 MED ORDER — OXYCODONE HCL 5 MG PO TABS
5.0000 mg | ORAL_TABLET | Freq: Once | ORAL | Status: AC | PRN
  Administered 2023-07-27: 5 mg via ORAL

## 2023-07-27 MED ORDER — SODIUM CHLORIDE 0.9 % IV SOLN
12.5000 mg | Freq: Once | INTRAVENOUS | Status: DC
  Filled 2023-07-27: qty 0.5

## 2023-07-27 MED ORDER — TRANEXAMIC ACID-NACL 1000-0.7 MG/100ML-% IV SOLN
INTRAVENOUS | Status: AC
  Filled 2023-07-27: qty 100

## 2023-07-27 MED ORDER — FENTANYL CITRATE (PF) 100 MCG/2ML IJ SOLN
INTRAMUSCULAR | Status: DC | PRN
  Administered 2023-07-27 (×4): 50 ug via INTRAVENOUS

## 2023-07-27 MED ORDER — PROPOFOL 10 MG/ML IV BOLUS
INTRAVENOUS | Status: AC
  Filled 2023-07-27: qty 20

## 2023-07-27 MED ORDER — SUGAMMADEX SODIUM 200 MG/2ML IV SOLN
INTRAVENOUS | Status: DC | PRN
  Administered 2023-07-27: 160 mg via INTRAVENOUS

## 2023-07-27 MED ORDER — CHLORHEXIDINE GLUCONATE 0.12 % MT SOLN
OROMUCOSAL | Status: AC
  Filled 2023-07-27: qty 15

## 2023-07-27 MED ORDER — LACTATED RINGERS IV SOLN: INTRAVENOUS | Status: DC

## 2023-07-27 MED ORDER — CHLORHEXIDINE GLUCONATE 0.12 % MT SOLN
15.0000 mL | Freq: Once | OROMUCOSAL | Status: AC
  Administered 2023-07-27: 15 mL via OROMUCOSAL

## 2023-07-27 MED ORDER — ONDANSETRON HCL 4 MG PO TABS: 4.0000 mg | ORAL_TABLET | Freq: Four times a day (QID) | ORAL | Status: DC | PRN

## 2023-07-27 MED ORDER — TRANEXAMIC ACID-NACL 1000-0.7 MG/100ML-% IV SOLN
1000.0000 mg | INTRAVENOUS | Status: AC
  Administered 2023-07-27 (×2): 1000 mg via INTRAVENOUS

## 2023-07-27 MED ORDER — METOCLOPRAMIDE HCL 10 MG PO TABS: 5.0000 mg | ORAL_TABLET | Freq: Three times a day (TID) | ORAL | Status: DC | PRN

## 2023-07-27 MED ORDER — ONDANSETRON 4 MG PO TBDP: 4.0000 mg | ORAL_TABLET | Freq: Three times a day (TID) | ORAL | 1 refills | Status: DC | PRN

## 2023-07-27 MED ORDER — METOCLOPRAMIDE HCL 5 MG/ML IJ SOLN
5.0000 mg | Freq: Three times a day (TID) | INTRAMUSCULAR | Status: DC | PRN
  Administered 2023-07-27: 10 mg via INTRAVENOUS

## 2023-07-27 MED ORDER — TRIAMCINOLONE ACETONIDE 40 MG/ML IJ SUSP
INTRAMUSCULAR | Status: AC
  Filled 2023-07-27: qty 2

## 2023-07-27 MED ORDER — METOCLOPRAMIDE HCL 5 MG/ML IJ SOLN
INTRAMUSCULAR | Status: AC
  Filled 2023-07-27: qty 2

## 2023-07-27 MED ORDER — KETOROLAC TROMETHAMINE 15 MG/ML IJ SOLN
INTRAMUSCULAR | Status: AC
  Filled 2023-07-27: qty 1

## 2023-07-27 MED ORDER — DEXMEDETOMIDINE HCL IN NACL 80 MCG/20ML IV SOLN
INTRAVENOUS | Status: DC | PRN
  Administered 2023-07-27 (×2): 4 ug via INTRAVENOUS

## 2023-07-27 MED ORDER — ACETAMINOPHEN 325 MG PO TABS: 325.0000 mg | ORAL_TABLET | Freq: Four times a day (QID) | ORAL | Status: DC | PRN

## 2023-07-27 MED ORDER — DEXAMETHASONE SODIUM PHOSPHATE 10 MG/ML IJ SOLN
INTRAMUSCULAR | Status: DC | PRN
  Administered 2023-07-27: 8 mg via INTRAVENOUS

## 2023-07-27 MED ORDER — PROPOFOL 10 MG/ML IV BOLUS
INTRAVENOUS | Status: DC | PRN
  Administered 2023-07-27: 130 mg via INTRAVENOUS

## 2023-07-27 MED ORDER — ONDANSETRON HCL 4 MG/2ML IJ SOLN
INTRAMUSCULAR | Status: DC | PRN
  Administered 2023-07-27: 4 mg via INTRAVENOUS

## 2023-07-27 MED ORDER — OXYCODONE HCL 5 MG/5ML PO SOLN: 5.0000 mg | Freq: Once | ORAL | Status: AC | PRN

## 2023-07-27 MED ORDER — OXYCODONE HCL 5 MG PO TABS: 5.0000 mg | ORAL_TABLET | ORAL | 0 refills | Status: DC | PRN

## 2023-07-27 MED ORDER — ONDANSETRON HCL 4 MG/2ML IJ SOLN
4.0000 mg | Freq: Four times a day (QID) | INTRAMUSCULAR | Status: DC | PRN
  Administered 2023-07-27: 4 mg via INTRAVENOUS

## 2023-07-27 MED ORDER — ORAL CARE MOUTH RINSE: 15.0000 mL | Freq: Once | OROMUCOSAL | Status: AC

## 2023-07-27 MED ORDER — KETOROLAC TROMETHAMINE 15 MG/ML IJ SOLN
15.0000 mg | Freq: Once | INTRAMUSCULAR | Status: AC
  Administered 2023-07-27: 15 mg via INTRAVENOUS

## 2023-07-27 MED ORDER — BUPIVACAINE HCL (PF) 0.5 % IJ SOLN
INTRAMUSCULAR | Status: AC
  Filled 2023-07-27: qty 30

## 2023-07-27 MED ORDER — SODIUM CHLORIDE 0.9 % IR SOLN
Status: DC | PRN
  Administered 2023-07-27: 3000 mL

## 2023-07-27 MED ORDER — OXYCODONE HCL 5 MG PO TABS
ORAL_TABLET | ORAL | Status: AC
  Filled 2023-07-27: qty 2

## 2023-07-27 SURGICAL SUPPLY — 65 items
APL PRP STRL LF DISP 70% ISPRP (MISCELLANEOUS) ×1
BLADE SAW 90X13X1.19 OSCILLAT (BLADE) ×1 IMPLANT
BLADE SAW SAG 25X90X1.19 (BLADE) ×1 IMPLANT
BLADE SURG SZ20 CARB STEEL (BLADE) ×1 IMPLANT
BNDG CMPR STD VLCR NS LF 5.8X6 (GAUZE/BANDAGES/DRESSINGS) ×1
BNDG ELASTIC 6X5.8 VLCR NS LF (GAUZE/BANDAGES/DRESSINGS) ×1 IMPLANT
CEMENT BONE R 1X40 (Cement) ×2 IMPLANT
CEMENT VACUUM MIXING SYSTEM (MISCELLANEOUS) ×1 IMPLANT
CHLORAPREP W/TINT 26 (MISCELLANEOUS) ×1 IMPLANT
COMP TIB PS KNEE E 0D RT (Joint) ×1 IMPLANT
COMPONET TIB PS KNEE E 0D RT (Joint) IMPLANT
COOLER POLAR GLACIER W/PUMP (MISCELLANEOUS) ×1 IMPLANT
COVER MAYO STAND STRL (DRAPES) ×1 IMPLANT
CUFF TOURN SGL QUICK 24 (TOURNIQUET CUFF)
CUFF TOURN SGL QUICK 34 (TOURNIQUET CUFF)
CUFF TRNQT CYL 24X4X16.5-23 (TOURNIQUET CUFF) IMPLANT
CUFF TRNQT CYL 34X4.125X (TOURNIQUET CUFF) IMPLANT
DRAPE IMP U-DRAPE 54X76 (DRAPES) ×1 IMPLANT
DRAPE SHEET LG 3/4 BI-LAMINATE (DRAPES) ×1 IMPLANT
DRAPE U-SHAPE 47X51 STRL (DRAPES) ×1 IMPLANT
DRSG MEPILEX SACRM 8.7X9.8 (GAUZE/BANDAGES/DRESSINGS) IMPLANT
DRSG OPSITE POSTOP 4X10 (GAUZE/BANDAGES/DRESSINGS) ×1 IMPLANT
DRSG OPSITE POSTOP 4X8 (GAUZE/BANDAGES/DRESSINGS) ×1 IMPLANT
ELECT CAUTERY BLADE 6.4 (BLADE) ×1 IMPLANT
ELECT REM PT RETURN 9FT ADLT (ELECTROSURGICAL) ×1
ELECTRODE REM PT RTRN 9FT ADLT (ELECTROSURGICAL) ×1 IMPLANT
FEMUR CMT CR STD SZ 8 RT KNEE (Joint) ×1 IMPLANT
FEMUR CMTD CR STD SZ 8 RT KNEE (Joint) IMPLANT
GAUZE XEROFORM 1X8 LF (GAUZE/BANDAGES/DRESSINGS) ×1 IMPLANT
GLOVE BIO SURGEON STRL SZ7.5 (GLOVE) ×4 IMPLANT
GLOVE BIO SURGEON STRL SZ8 (GLOVE) ×4 IMPLANT
GLOVE BIOGEL PI IND STRL 8 (GLOVE) ×1 IMPLANT
GLOVE INDICATOR 8.0 STRL GRN (GLOVE) ×1 IMPLANT
GOWN STRL REUS W/ TWL LRG LVL3 (GOWN DISPOSABLE) ×1 IMPLANT
GOWN STRL REUS W/ TWL XL LVL3 (GOWN DISPOSABLE) ×1 IMPLANT
GOWN STRL REUS W/TWL LRG LVL3 (GOWN DISPOSABLE) ×1
GOWN STRL REUS W/TWL XL LVL3 (GOWN DISPOSABLE) ×1
HANDLE YANKAUER SUCT OPEN TIP (MISCELLANEOUS) ×1 IMPLANT
HOOD PEEL AWAY T7 (MISCELLANEOUS) ×3 IMPLANT
INSERT TIBIA KNEE RIGHT 10 (Joint) IMPLANT
IV NS IRRIG 3000ML ARTHROMATIC (IV SOLUTION) ×1 IMPLANT
KIT TURNOVER KIT A (KITS) ×1 IMPLANT
MANIFOLD NEPTUNE II (INSTRUMENTS) ×1 IMPLANT
NDL SPNL 20GX3.5 QUINCKE YW (NEEDLE) ×1 IMPLANT
NEEDLE SPNL 20GX3.5 QUINCKE YW (NEEDLE) ×1
NS IRRIG 1000ML POUR BTL (IV SOLUTION) ×1 IMPLANT
PACK TOTAL KNEE (MISCELLANEOUS) ×1 IMPLANT
PAD WRAPON POLAR KNEE (MISCELLANEOUS) ×1 IMPLANT
PENCIL SMOKE EVACUATOR (MISCELLANEOUS) ×1 IMPLANT
PIN DRILL HDLS TROCAR 75 4PK (PIN) IMPLANT
PULSAVAC PLUS IRRIG FAN TIP (DISPOSABLE) ×1
SCREW FEMALE HEX FIX 25X2.5 (ORTHOPEDIC DISPOSABLE SUPPLIES) IMPLANT
STAPLER SKIN PROX 35W (STAPLE) ×1 IMPLANT
STEM POLY PAT PLY 35M KNEE (Knees) IMPLANT
SUCTION TUBE FRAZIER 10FR DISP (SUCTIONS) ×1 IMPLANT
SUT VIC AB 0 CT1 36 (SUTURE) ×3 IMPLANT
SUT VIC AB 2-0 CT1 27 (SUTURE) ×3
SUT VIC AB 2-0 CT1 TAPERPNT 27 (SUTURE) ×3 IMPLANT
SYR 10ML LL (SYRINGE) ×1 IMPLANT
SYR 20ML LL LF (SYRINGE) ×1 IMPLANT
SYR 30ML LL (SYRINGE) IMPLANT
TIP FAN IRRIG PULSAVAC PLUS (DISPOSABLE) ×1 IMPLANT
TRAP FLUID SMOKE EVACUATOR (MISCELLANEOUS) ×2 IMPLANT
WATER STERILE IRR 500ML POUR (IV SOLUTION) ×1 IMPLANT
WRAPON POLAR PAD KNEE (MISCELLANEOUS) ×1

## 2023-07-27 NOTE — Discharge Instructions (Addendum)
Orthopedic discharge instructions: May shower with intact OpSite dressing. Apply ice frequently to knee or use Polar Care. Start Eliquis 1 tablet (5 mg) twice daily on Friday, 07/28/2023, for 4 weeks, then resume Eliquis 2.5 mg twice daily. Take pain medication as prescribed when needed.  May supplement with ES Tylenol if necessary. May weight-bear as tolerated on right leg - use walker for balance and support. Follow-up in 10-14 days or as scheduled.    AMBULATORY SURGERY  DISCHARGE INSTRUCTIONS  The drugs that you were given will stay in your system until tomorrow so for the next 24 hours you should not:  Drive an automobile Make any legal decisions Drink any alcoholic beverage  You may resume regular meals tomorrow.  Today it is better to start with liquids and gradually work up to solid foods.  You may eat anything you prefer, but it is better to start with liquids, then soup and crackers, and gradually work up to solid foods.  Please notify your doctor immediately if you have any unusual bleeding, trouble breathing, redness and pain at the surgery site, drainage, fever, or pain not relieved by medication.  Additional Instructions:  Leave teal bracelet on for 4 days  Please contact your physician with any problems or Same Day Surgery at [phone], Monday through Friday 6 am to 4 pm, or [place] at [facility] number at [phone].

## 2023-07-27 NOTE — TOC CM/SW Note (Signed)
[facility] confirmed with [place].

## 2023-07-27 NOTE — Anesthesia Preprocedure Evaluation (Addendum)
Anesthesia Evaluation  Patient identified by MRN, date of birth, ID band Patient awake    Reviewed: Allergy & Precautions, NPO status , Patient's Chart, lab work & pertinent test results  History of Anesthesia Complications (+) PONV and history of anesthetic complications  Airway Mallampati: II  TM Distance: >3 FB Neck ROM: full    Dental no notable dental hx.    Pulmonary sleep apnea    Pulmonary exam normal        Cardiovascular + Peripheral Vascular Disease and + DVT (s/p IVC filter)  Normal cardiovascular exam+ Valvular Problems/Murmurs MR and MVP      Neuro/Psych negative neurological ROS  negative psych ROS   GI/Hepatic negative GI ROS, Neg liver ROS,,,  Endo/Other  negative endocrine ROS    Renal/GU      Musculoskeletal  (+) Arthritis ,    Abdominal   Peds  Hematology  (+) Blood dyscrasia, anemia   Anesthesia Other Findings Past Medical History: No date: Anemia No date: Arthritis     Comment:  osteoarthriist No date: Cancer [facility])     Comment:  skin No date: Carotid stenosis, asymptomatic, bilateral No date: Cellulitis No date: Complication of anesthesia     Comment:  nausea 09/2019: DVT (deep venous thrombosis) (HCC) No date: Heart murmur 2020: History of methicillin resistant staphylococcus aureus (MRSA) No date: Mitral valve prolapse No date: Mitral valve regurgitation No date: Pericardial effusion No date: Pleural effusion No date: PONV (postoperative nausea and vomiting) No date: Pre-diabetes No date: Primary osteoarthritis of right knee 2020: Pulmonary embolism (HCC) No date: Sleep apnea     Comment:  uses cpap No date: Tendonitis     Comment:  outer aspect of right foot No date: Thyroid nodule No date: UTI (lower urinary tract infection) No date: Vitamin B 12 deficiency  Past Surgical History: No date: ABDOMINAL HYSTERECTOMY No date: APPENDECTOMY No date: COLONOSCOPY No date:  FRACTURE SURGERY     Comment:  left wrist No date: FRACTURE SURGERY; Left     Comment:  wrist 06/03/2021: INCISION AND DRAINAGE; Right     Comment:  Procedure: INCISION AND DRAINAGE-Right Index Finger;                Surgeon: Christena Flake, MD;  Location: [facility] ORS;                Service: Orthopedics;  Laterality: Right; 03/15/2022: IVC FILTER INSERTION; N/A     Comment:  Procedure: IVC FILTER INSERTION;  Surgeon: Renford Dills, MD;  Location: [facility] INVASIVE CV LAB;  Service:              Cardiovascular;  Laterality: N/A; 07/18/2023: IVC FILTER INSERTION; N/A     Comment:  Procedure: IVC FILTER INSERTION;  Surgeon: Renford Dills, MD;  Location: [facility] INVASIVE CV LAB;  Service:              Cardiovascular;  Laterality: N/A; 05/31/2022: IVC FILTER REMOVAL; N/A     Comment:  Procedure: IVC FILTER REMOVAL;  Surgeon: Renford Dills, MD;  Location: [facility] INVASIVE CV LAB;  Service:              Cardiovascular;  Laterality: N/A; No date: KNEE SURGERY; Left  Comment:  debridement No date: TONSILLECTOMY 03/22/2022: TOTAL HIP ARTHROPLASTY; Right     Comment:  Procedure: TOTAL HIP ARTHROPLASTY;  Surgeon: Christena Flake, MD;  Location: [facility] ORS;  Service: Orthopedics;                Laterality: Right;  BMI    Body Mass Index: 28.73 kg/m      Reproductive/Obstetrics negative OB ROS                             Anesthesia Physical Anesthesia Plan  ASA: 3  Anesthesia Plan: General ETT   Post-op Pain Management: Toradol IV (intra-op)*, Ofirmev IV (intra-op)* and Dilaudid IV   Induction: Intravenous  PONV Risk Score and Plan: 4 or greater and Ondansetron, Dexamethasone and Treatment may vary due to age or medical condition  Airway Management Planned: Oral ETT  Additional Equipment:   Intra-op Plan:   Post-operative Plan: Extubation in OR  Informed Consent: I have reviewed the patients History  and Physical, chart, labs and discussed the procedure including the risks, benefits and alternatives for the proposed anesthesia with the patient or authorized representative who has indicated his/her understanding and acceptance.     Dental Advisory Given  Plan Discussed with: Anesthesiologist, CRNA and Surgeon  Anesthesia Plan Comments: (Patient consented for risks of anesthesia including but not limited to:  - adverse reactions to medications - damage to eyes, teeth, lips or other oral mucosa - nerve damage due to positioning  - sore throat or hoarseness - Damage to heart, brain, nerves, lungs, other parts of body or loss of life  Patient voiced understanding and assent.  Patient took Eliquis 10/28, not a candidate for spinal )        Anesthesia Quick Evaluation

## 2023-07-27 NOTE — Anesthesia Procedure Notes (Signed)
Procedure Name: Intubation Date/Time: 07/27/2023 7:54 AM  Performed by: Nelle Don, CRNAPre-anesthesia Checklist: Patient identified, Emergency Drugs available, Suction available and Patient being monitored Patient Re-evaluated:Patient Re-evaluated prior to induction Oxygen Delivery Method: Circle system utilized Preoxygenation: Pre-oxygenation with 100% oxygen Induction Type: IV induction Ventilation: Mask ventilation without difficulty Laryngoscope Size: McGraph and 3 Grade View: Grade I Tube type: Oral Tube size: 7.0 mm Number of attempts: 1 Airway Equipment and Method: Stylet and Video-laryngoscopy Placement Confirmation: ETT inserted through vocal cords under direct vision, positive ETCO2 and breath sounds checked- equal and bilateral Secured at: 20 cm Tube secured with: Tape Dental Injury: Teeth and Oropharynx as per pre-operative assessment  Comments: Elective mcgraph

## 2023-07-27 NOTE — H&P (Signed)
History of Present Illness: Dana Bautista is a 77 y.o. female who presents today for her surgical history and physical for upcoming right total knee arthroplasty. Surgery is scheduled with Dr. Joice Lofts on 07/27/2023. The patient continues to report moderate aching discomfort in the right knee which is worse with prolonged periods of walking and standing. The patient has continued to ice and elevate the right knee on occasion. She does report increased discomfort when she attempts to stand up from a seated position. She denies any recent falls or trauma affecting the right knee. She denies any numbness or tingling to the right lower extremity. She denies any fevers or chills at this time. 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 The patient is currently on Eliquis and she is scheduled to undergo a IVC filter placement with vascular surgery on 07/18/2023. The patient denies any personal history of heart attack or stroke. She denies any history of asthma or COPD. She denies any personal history of diabetes.  Past Medical History: Anemia due to vitamin B12 deficiency 04/13/2017  Asymptomatic bilateral carotid artery stenosis 06/13/2016  Carotid stenosis, asymptomatic, bilateral 10/06/2016  Chronic constipation 02/25/2017  H/O mitral valve prolapse 06/13/2016  History of cataract  History of skin cancer  Hx of scabies  Osteoarthritis of knees, bilateral  Vitamin D deficiency 12/21/2015   Past Surgical History: EGD 03/04/2009 (Small Hiatal Hernia, LA Grade B Erosive Esophagitis, Gastritis)  EGD 07/06/2009 (Normal; above findings resolved with PPI)  COLONOSCOPY 07/15/2009 (Adenomatous Polyp: CBF 06/2014)  COLONOSCOPY 08/31/2017 (Poor Prep; R/S'ed 10/31/2017)  COLONOSCOPY 10/31/2017 (PH Adenomatous Polyp: 10/2022)  Irrigation and debridement of bite wounds and flexor tendon sheath, right index finger Right 06/03/2021 (Dr. 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 Joice Lofts)  APPENDECTOMY  CATARACT EXTRACTION  HYSTERECTOMY  skin cancer excised (face)  TONSILLECTOMY  wrist surgery Left   Past Family History: Asthma Mother  Lung cancer Father   Medications: acetaminophen (TYLENOL) 500 MG tablet Take by mouth 1-2 tablets as needed  alendronate (FOSAMAX) 70 MG tablet Take 1 tablet (70 mg total) by mouth every 7 (seven) days Take on an empty stomach with a full glass of water. Avoid mineral or well water. Do not eat or take other medications for at least 30 minutes after dose. Sit or stand for at least 30 minutes after dose. 4 tablet 2  ascorbic acid, vitamin C, (VITAMIN C) 100 MG tablet Take 100 mg by mouth once daily  cholecalciferol (VITAMIN D3) 1000 unit tablet Take 1,000 Units by mouth once daily  clindamycin (CLEOCIN) 150 MG capsule Take 150 mg by mouth once as needed For Dental procedures  clobetasoL (TEMOVATE) 0.05 % cream Apply topically 2 (two) times daily  ELIQUIS 2.5 mg tablet Take 2.5 mg by mouth every 12 (twelve) hours  ferrous sulfate 325 (65 FE) MG tablet Take 325 mg by mouth daily with breakfast  multivitamin tablet Take 1 tablet by mouth once daily  potassium chloride (KLOR-CON M10) 10 mEq ER tablet Take 10 mEq by mouth 2 (two) times daily  tiZANidine (ZANAFLEX) 2 MG tablet Take 1 tablet (2 mg total) by mouth 3 (three) times daily as needed for Muscle spasms 60 tablet 2  triamcinolone 0.1 % cream   Current Facility-Administered Medications  Medication Dose Route Frequency Provider Last Rate Last Admin  cyanocobalamin (VITAMIN B12) injection 1,000 mcg 1,000 mcg Intramuscular Q30 Days Leotis Shames, MD 1,000 mcg at 06/15/23 8469   Allergies: Codeine  Vomiting  Gabapentin (Nausea And Vomiting)  Levaquin [Levofloxacin] (Rash, lip swelling)  Penicillin Rash  Sulfa (Swelling, Lip swelling)  Latex Other (Rash, Skin peels) Sulfur (Bulk) Other (Lip swelling, difficulty breathing)   Review of Systems:  A comprehensive 14 point ROS  was performed, reviewed by me today, and the pertinent orthopaedic findings are documented in the HPI.  Physical Exam: BP 118/64  Ht 165.1 cm (5\' 5" )  Wt 81.5 kg (179 lb 9.6 oz)  BMI 29.89 kg/m  General/Constitutional: The patient appears to be well-nourished, well-developed, and in no acute distress. Neuro/Psych: Normal mood and affect, oriented to person, place and time. Eyes: Non-icteric. Pupils are equal, round, and reactive to light, and exhibit synchronous movement. ENT: Unremarkable. Lymphatic: No palpable adenopathy. Respiratory: Lungs clear to auscultation, Normal chest excursion, No wheezes, and Non-labored breathing Cardiovascular: Regular rate and rhythm. No murmurs. and No edema, swelling or tenderness, except as noted in detailed exam. Integumentary: No impressive skin lesions present, except as noted in detailed exam. Musculoskeletal: Unremarkable, except as noted in detailed exam.  Right knee exam: GAIT: moderate limp and uses no assistive devices. ALIGNMENT: mild valgus SKIN: unremarkable SWELLING: mild EFFUSION: small WARMTH: no warmth TENDERNESS: moderate tenderness along lateral joint line, but no medial joint line tenderness ROM: 0 to 100 degrees with mild-moderate pain in maximal flexion McMURRAY'S: equivocal PATELLOFEMORAL: normal tracking with no peri-patellar tenderness and negative apprehension sign CREPITUS: no LACHMAN'S: negative PIVOT SHIFT: Not evaluated ANTERIOR DRAWER: negative POSTERIOR DRAWER: negative VARUS/VALGUS: Mildly positive pseudolaxity to valgus stressing  She is neurovascularly intact to the right lower extremity and foot.  Knee Imaging: Recent AP weightbearing of both knees, as well as lateral and merchant views of the right knee were reviewed at today's visit. These films demonstrate moderate-severe degenerative changes, primarily involving the lateral compartment with 90% lateral joint space narrowing. Overall alignment is mild  valgus. No fractures, lytic lesions, or abnormal calcifications are noted.  Impression: 1. Primary osteoarthritis of right knee. 2. Complex tear of lateral meniscus of right knee.  Plan:  1. Treatment options were discussed today with the patient. 2. The patient is scheduled to undergo a right total knee arthroplasty with Dr. Joice Lofts on 07/27/2023. The patient is scheduled to undergo IVC filter placement on 07/18/2023. 3. The risk and benefits of surgical intervention were discussed in detail at today's visit. The patient verbalizes her understanding and wishes to proceed at this time. 4. This document will serve as a surgical history and physical for the patient. 5. The patient will follow-up per standard postop protocol. They can call the clinic they have any questions, new symptoms develop or symptoms worsen.  The procedure was discussed with the patient, as were the potential risks (including bleeding, infection, nerve and/or blood vessel injury, persistent or recurrent pain, failure of the hardware, knee instability, need for further surgery, blood clots, strokes, heart attacks and/or arhythmias, pneumonia, etc.) and benefits. The patient states her understanding and wishes to proceed.    H&P reviewed and patient re-examined. No changes.

## 2023-07-27 NOTE — Transfer of Care (Signed)
Immediate Anesthesia Transfer of Care Note  Patient: Dana Bautista  Procedure(s) Performed: TOTAL KNEE ARTHROPLASTY (Right: Knee)  Patient Location: PACU  Anesthesia Type:General  Level of Consciousness: awake, alert , and oriented  Airway & Oxygen Therapy: Patient Spontanous Breathing and Patient connected to face mask oxygen  Post-op Assessment: Report given to RN, Post -op Vital signs reviewed and stable, and Patient moving all extremities X 4  Post vital signs: Reviewed and stable  Last Vitals:  Vitals Value Taken Time  BP 120/71 07/27/23 0958  Temp    Pulse 93 07/27/23 1000  Resp 14 07/27/23 1000  SpO2 100 % 07/27/23 1000  Vitals shown include unfiled device data.  Last Pain:  Vitals:   07/27/23 0629  TempSrc: Tympanic  PainSc: 0-No pain         Complications: No notable events documented.

## 2023-07-27 NOTE — Op Note (Signed)
07/27/2023  9:49 AM  Patient:   Dana Bautista  Pre-Op Diagnosis:   Degenerative joint disease, right knee.  Post-Op Diagnosis:   Same  Procedure:   Right TKA using all-cemented Zimmer Persona system with a #8 PCR femur, a(n) E-sized  tibial tray with a 10 mm medial congruent E-poly insert, and a 9 x 35 mm all-poly 3-pegged domed patella.  Surgeon:   Maryagnes Amos, MD  Assistant:   Horris Latino, PA-C   Anesthesia:   GET  Findings:   As above  Complications:   None  EBL:   10 cc  Fluids:   600 cc crystalloid  UOP:   None  TT:   80 minutes at 300 mmHg  Drains:   None  Closure:   Staples  Implants:   As above  Brief Clinical Note:   The patient is a 77 year old female with a long history of progressively worsening right knee pain. The patient's symptoms have progressed despite medications, activity modification, injections, etc. The patient's history and examination were consistent with advanced degenerative joint disease of the right knee confirmed by plain radiographs. The patient presents at this time for a right total knee arthroplasty.  Procedure:   The patient was brought into the operating room and laid in the supine position. After adequate general endotracheal intubation and anesthesia was obtained, the right lower extremity was prepped with ChloraPrep solution and draped sterilely. Preoperative antibiotics were administered. A timeout was performed to verify the appropriate surgical site before the limb was exsanguinated with an Esmarch and the tourniquet inflated to 300 mmHg.   A standard anterior approach to the knee was made through an approximately 6-7 inch incision. The incision was carried down through the subcutaneous tissues to expose superficial retinaculum. This was split the length of the incision and the medial flap elevated sufficiently to expose the medial retinaculum. The medial retinaculum was incised, leaving a 3-4 mm cuff of tissue on the patella. This  was extended distally along the medial border of the patellar tendon and proximally through the medial third of the quadriceps tendon. A subtotal fat pad excision was performed before the soft tissues were elevated off the anteromedial and anterolateral aspects of the proximal tibia to the level of the collateral ligaments. The anterior portions of the medial and lateral menisci were removed, as was the anterior cruciate ligament. With the knee flexed to 90, the external tibial guide was positioned and the appropriate proximal tibial cut made. This piece was taken to the back table where it was measured and found to be optimally replicated by a(n) E-sized component.  Attention was directed to the distal femur. The intramedullary canal was accessed through a 3/8" drill hole. The intramedullary guide was inserted and positioned in order to obtain a neutral flexion gap. The distal cutting block was placed at 5 of valgus alignment. Using the +0 slot, the distal cut was made. The distal femur was measured and found to be optimally replicated by the #8 component. The #8 4-in-1 cutting block was positioned and first the posterior, then the posterior chamfer, the anterior chamfer, and finally the anterior cuts were made after verifying that the anterior cortex would not be notched.   At this point, the posterior portions medial and lateral menisci were removed. A trial reduction was performed using the appropriate femoral and tibial components with the 10 mm insert. This demonstrated excellent stability to varus and valgus stressing both in flexion and extension while permitting full extension.  Patellar tracking was assessed and found to be excellent. Therefore, the tibial trial position was marked on the proximal tibia. The patella thickness was measured and found to be 20 mm. Therefore, the appropriate cut was made. The patellar surface was measured and found to be optimally replicated by the 35 mm component. The  three peg holes were drilled in place before the trial button was inserted. Patella tracking was assessed and found to be excellent, passing the "no thumb test". The lug holes were drilled into the distal femur before the trial component was removed.  The tibial tray was repositioned before the keel was created using the appropriate tower, reamer, and punch.  The bony surfaces were prepared for cementing by irrigating them thoroughly with sterile saline solution via the jet lavage system. A bone plug was fashioned from some of the bone that had been removed previously and used to plug the distal femoral canal. In addition, a "cocktail" of 20 cc of Exparel, 30 cc of 0.5% Sensorcaine, 2 cc of Kenalog 40 (80 mg), and 30 mg of Toradol diluted out to 90 cc with normal saline was injected into the postero-medial and postero-lateral aspects of the knee, the medial and lateral gutter regions, and the peri-incisional tissues to help with postoperative analgesia. Meanwhile, the cement was being mixed on the back table.   When the cement was ready, the tibial tray was cemented in first. The excess cement was removed using Personal assistant. Next, the femoral component was impacted into place. Again, the excess cement was removed using Personal assistant. The 10 mm trial insert was positioned and the knee brought into extension while the cement hardened. Finally, the patella was cemented into place and secured using the patellar clamp. Again, the excess cement was removed using Personal assistant. Once the cement had hardened, the knee was placed through a range of motion with the findings as described above. Therefore, the trial insert was removed and, after verifying that no cement had been retained posteriorly, the permanent 10 mm medial congruent E-polyethylene insert was snapped into place with care taken to ensure appropriate locking of the insert. Again the knee was placed through a range of motion with the findings as  described above.  The wound was copiously irrigated with sterile saline solution using the jet lavage system before the quadriceps tendon and retinacular layer were reapproximated using #0 Vicryl interrupted sutures. The superficial retinacular layer also was closed using a running #0 Vicryl suture. The subcutaneous tissues were closed in several layers using 2-0 Vicryl interrupted sutures. The skin was closed using staples. A sterile honeycomb dressing was applied to the skin before the leg was wrapped with an Ace wrap to accommodate the Polar Care device. The patient was then awakened, extubated, and returned to the recovery room in satisfactory condition after tolerating the procedure well.

## 2023-07-27 NOTE — Evaluation (Signed)
Physical Therapy Evaluation Patient Details Name: Dana Bautista MRN: [id number] DOB: Feb 22, 1946 Today's Date: 07/27/2023  History of Present Illness  Patient is s/p R TKA.Marland Kitchen PMH includes THR in 2023.  Clinical Impression  Patient received in bed. She is agreeable to PT session. Patient requires min A for bed mobility. Has mild nausea at start of session but does not increase with mobility.  She is able to stand with cues and cga from bed and toilet. Patient ambulated 150 feet and up/down 3 steps with B Rails. We reviewed and practiced HEP and handout provided to patient. She demonstrates and verbalized understanding. Patient has met requirements for returning home today if so desired.            If plan is discharge home, recommend the following: A little help with walking and/or transfers;A little help with bathing/dressing/bathroom   Can travel by private vehicle    yes    Equipment Recommendations None recommended by PT  Recommendations for Other Services       Functional Status Assessment Patient has had a recent decline in their functional status and demonstrates the ability to make significant improvements in function in a reasonable and predictable amount of time.     Precautions / Restrictions Precautions Precautions: Fall Restrictions Weight Bearing Restrictions: Yes RLE Weight Bearing: Weight bearing as tolerated      Mobility  Bed Mobility Overal bed mobility: Needs Assistance Bed Mobility: Supine to Sit, Sit to Supine     Supine to sit: Min assist, HOB elevated Sit to supine: Min assist, HOB elevated   General bed mobility comments: Assist needed for RLE    Transfers Overall transfer level: Needs assistance Equipment used: Rolling walker (2 wheels) Transfers: Sit to/from Stand Sit to Stand: Contact guard assist                Ambulation/Gait Ambulation/Gait assistance: Contact guard assist Gait Distance (Feet): 150 Feet Assistive device: Rolling  walker (2 wheels) Gait Pattern/deviations: Step-to pattern, Decreased step length - left, Decreased stance time - right Gait velocity: decr     General Gait Details: no LOB, safe use of AD  Stairs Stairs: Yes Stairs assistance: Contact guard assist Stair Management: Two rails, Step to pattern Number of Stairs: 3 General stair comments: patient has increased pain on steps, but is generally safe with cga  Wheelchair Mobility     Tilt Bed    Modified Rankin (Stroke Patients Only)       Balance Overall balance assessment: Needs assistance Sitting-balance support: Feet supported Sitting balance-Leahy Scale: Good     Standing balance support: Bilateral upper extremity supported, During functional activity, Reliant on assistive device for balance Standing balance-Leahy Scale: Good                               Pertinent Vitals/Pain Pain Assessment Pain Assessment: Faces Faces Pain Scale: Hurts little more Pain Location: R knee Pain Descriptors / Indicators: Discomfort, Sore Pain Intervention(s): Monitored during session, Repositioned, Ice applied    Home Living Family/patient expects to be discharged to:: Private residence Living Arrangements: Spouse/significant other Available Help at Discharge: Family;Available 24 hours/day Type of Home: House Home Access: Stairs to enter   [employer] of Steps: 2 in garage   Home Layout: One level Home Equipment: Agricultural consultant (2 wheels);Shower seat      Prior Function Prior Level of Function : Independent/Modified Independent;Driving  Extremity/Trunk Assessment   Upper Extremity Assessment Upper Extremity Assessment: Overall WFL for tasks assessed    Lower Extremity Assessment Lower Extremity Assessment: RLE deficits/detail RLE Deficits / Details: ROM 0-70 RLE Coordination: decreased gross motor    Cervical / Trunk Assessment Cervical / Trunk Assessment: Normal   Communication   Communication Communication: No apparent difficulties Cueing Techniques: Verbal cues  Cognition Arousal: Alert Behavior During Therapy: WFL for tasks assessed/performed Overall Cognitive Status: Within Functional Limits for tasks assessed                                          General Comments      Exercises Total Joint Exercises Ankle Circles/Pumps: AROM, Both, 5 reps Quad Sets: AROM, Right, 5 reps Short Arc Quad: AROM, Right, 5 reps Heel Slides: AROM, Right, 5 reps Hip ABduction/ADduction: AROM, Right, 5 reps Straight Leg Raises: AAROM, Right, 5 reps Long Arc Quad: AAROM, Right, 5 reps Goniometric ROM: grossly 0-70 degrees   Assessment/Plan    PT Assessment Patient needs continued PT services  PT Problem List Decreased strength;Decreased mobility;Decreased range of motion;Decreased activity tolerance;Decreased balance;Pain       PT Treatment Interventions DME instruction;Gait training;Stair training;Functional mobility training;Therapeutic activities;Patient/family education;Balance training;Neuromuscular re-education;Therapeutic exercise    PT Goals (Current goals can be found in the Care Plan section)  Acute Rehab PT Goals Patient Stated Goal: return home PT Goal Formulation: With patient/family Time For Goal Achievement: 08/03/23 Potential to Achieve Goals: Good    Frequency Min 1X/week     Co-evaluation               AM-PAC PT "6 Clicks" Mobility  Outcome Measure Help needed turning from your back to your side while in a flat bed without using bedrails?: A Little Help needed moving from lying on your back to sitting on the side of a flat bed without using bedrails?: A Little Help needed moving to and from a bed to a chair (including a wheelchair)?: A Little Help needed standing up from a chair using your arms (e.g., wheelchair or bedside chair)?: A Little Help needed to walk in hospital room?: A Little Help needed  climbing 3-5 steps with a railing? : A Little 6 Click Score: 18    End of Session Equipment Utilized During Treatment: Gait belt Activity Tolerance: Patient tolerated treatment well Patient left: in bed;with call bell/phone within reach;with family/visitor present Nurse Communication: Mobility status PT Visit Diagnosis: Other abnormalities of gait and mobility (R26.89);Muscle weakness (generalized) (M62.81);Difficulty in walking, not elsewhere classified (R26.2);Pain Pain - Right/Left: Right Pain - part of body: Knee    Time: 1430-1501 PT Time Calculation (min) (ACUTE ONLY): 31 min   Charges:   PT Evaluation $PT Eval Moderate Complexity: 1 Mod PT Treatments $Gait Training: 8-22 mins PT General Charges $$ ACUTE PT VISIT: 1 Visit         Brieanne Mignone, PT, GCS 07/27/23,3:16 PM

## 2023-07-27 NOTE — Anesthesia Postprocedure Evaluation (Signed)
Anesthesia Post Note  Patient: Thora Aslam  Procedure(s) Performed: TOTAL KNEE ARTHROPLASTY (Right: Knee)  Patient location during evaluation: PACU Anesthesia Type: General Level of consciousness: awake and alert Pain management: pain level controlled Vital Signs Assessment: post-procedure vital signs reviewed and stable Respiratory status: spontaneous breathing, nonlabored ventilation, respiratory function stable and patient connected to nasal cannula oxygen Cardiovascular status: blood pressure returned to baseline and stable Postop Assessment: no apparent nausea or vomiting Anesthetic complications: no   No notable events documented.   Last Vitals:  Vitals:   07/27/23 1116 07/27/23 1239  BP: 135/63 127/62  Pulse: 81 70  Resp: 12 16  Temp: (!) 36.3 C 36.6 C  SpO2: 94% 93%    Last Pain:  Vitals:   07/27/23 1116  TempSrc:   PainSc: 3                  Louie Boston

## 2023-07-28 ENCOUNTER — Encounter: Payer: Self-pay | Admitting: Surgery

## 2023-08-12 ENCOUNTER — Emergency Department: Payer: Medicare Other

## 2023-08-12 ENCOUNTER — Emergency Department
Admission: EM | Admit: 2023-08-12 | Discharge: 2023-08-12 | Disposition: A | Discharge status: Home / Self Care | Payer: Medicare Other | Attending: Emergency Medicine | Admitting: Emergency Medicine

## 2023-08-12 ENCOUNTER — Other Ambulatory Visit: Payer: Self-pay

## 2023-08-12 ENCOUNTER — Ambulatory Visit
Admission: RE | Admit: 2023-08-12 | Discharge: 2023-08-12 | Disposition: A | Discharge status: Home / Self Care | Payer: Medicare Other | Source: Ambulatory Visit | Attending: Family Medicine | Admitting: Family Medicine

## 2023-08-12 VITALS — BP 142/78 | HR 122 | Temp 97.7°F | Resp 30

## 2023-08-12 DIAGNOSIS — R0602 Shortness of breath: Secondary | ICD-10-CM | POA: Diagnosis present

## 2023-08-12 DIAGNOSIS — R1030 Lower abdominal pain, unspecified: Secondary | ICD-10-CM | POA: Diagnosis not present

## 2023-08-12 DIAGNOSIS — R1084 Generalized abdominal pain: Secondary | ICD-10-CM | POA: Diagnosis not present

## 2023-08-12 DIAGNOSIS — E871 Hypo-osmolality and hyponatremia: Secondary | ICD-10-CM | POA: Insufficient documentation

## 2023-08-12 DIAGNOSIS — Z9889 Other specified postprocedural states: Secondary | ICD-10-CM

## 2023-08-12 DIAGNOSIS — R0609 Other forms of dyspnea: Secondary | ICD-10-CM | POA: Diagnosis not present

## 2023-08-12 DIAGNOSIS — R110 Nausea: Secondary | ICD-10-CM | POA: Diagnosis not present

## 2023-08-12 DIAGNOSIS — Z86718 Personal history of other venous thrombosis and embolism: Secondary | ICD-10-CM

## 2023-08-12 DIAGNOSIS — Z86711 Personal history of pulmonary embolism: Secondary | ICD-10-CM | POA: Diagnosis not present

## 2023-08-12 LAB — HEPATIC FUNCTION PANEL
ALT: 13 U/L (ref 0–44)
AST: 17 U/L (ref 15–41)
Albumin: 4.3 g/dL (ref 3.5–5.0)
Alkaline Phosphatase: 72 U/L (ref 38–126)
Bilirubin, Direct: 0.2 mg/dL (ref 0.0–0.2)
Indirect Bilirubin: 0.9 mg/dL (ref 0.3–0.9)
Total Bilirubin: 1.1 mg/dL (ref <1.2)
Total Protein: 7.1 g/dL (ref 6.5–8.1)

## 2023-08-12 LAB — BASIC METABOLIC PANEL
Anion gap: 12 (ref 5–15)
BUN: 10 mg/dL (ref 8–23)
CO2: 20 mmol/L — ABNORMAL LOW (ref 22–32)
Calcium: 9.1 mg/dL (ref 8.9–10.3)
Chloride: 95 mmol/L — ABNORMAL LOW (ref 98–111)
Creatinine, Ser: 0.62 mg/dL (ref 0.44–1.00)
GFR, Estimated: >60 mL/min (ref >60)
Glucose, Bld: 111 mg/dL — ABNORMAL HIGH (ref 70–99)
Potassium: 3.6 mmol/L (ref 3.5–5.1)
Sodium: 127 mmol/L — ABNORMAL LOW (ref 135–145)

## 2023-08-12 LAB — CBC
HCT: 36.8 % (ref 36.0–46.0)
Hemoglobin: 12.5 g/dL (ref 12.0–15.0)
MCH: 29.3 pg (ref 26.0–34.0)
MCHC: 34 g/dL (ref 30.0–36.0)
MCV: 86.2 fL (ref 80.0–100.0)
Platelets: 410 10*3/uL — ABNORMAL HIGH (ref 150–400)
RBC: 4.27 MIL/uL (ref 3.87–5.11)
RDW: 12.6 % (ref 11.5–15.5)
WBC: 8.8 10*3/uL (ref 4.0–10.5)
nRBC: 0 % (ref 0.0–0.2)

## 2023-08-12 LAB — TROPONIN I (HIGH SENSITIVITY): Troponin I (High Sensitivity): 6 ng/L (ref <18)

## 2023-08-12 MED ORDER — METOCLOPRAMIDE HCL 5 MG/ML IJ SOLN
10.0000 mg | Freq: Once | INTRAMUSCULAR | Status: AC
  Administered 2023-08-12: 10 mg via INTRAVENOUS
  Filled 2023-08-12: qty 2

## 2023-08-12 MED ORDER — IOHEXOL 350 MG/ML SOLN
100.0000 mL | Freq: Once | INTRAVENOUS | Status: AC | PRN
  Administered 2023-08-12: 100 mL via INTRAVENOUS

## 2023-08-12 MED ORDER — ONDANSETRON HCL 4 MG/2ML IJ SOLN
4.0000 mg | Freq: Once | INTRAMUSCULAR | Status: AC
  Administered 2023-08-12: 4 mg via INTRAMUSCULAR

## 2023-08-12 MED ORDER — SODIUM CHLORIDE 0.9 % IV BOLUS
1000.0000 mL | Freq: Once | INTRAVENOUS | Status: AC
  Administered 2023-08-12: 1000 mL via INTRAVENOUS

## 2023-08-12 NOTE — ED Provider Notes (Signed)
[facility]    CSN: [id number] Arrival date & time: 08/12/23  1102      History   Chief Complaint Chief Complaint  Patient presents with   Nausea    I had my 28 post total knee replacement. Check up yesterday was diagnosed with possibly impacted intestine. Doctor gave me anti-nausea medicine, following the. Bowel prep directions today so much worse need help needing injection for nausea help - Entered by patient   Shortness of Breath   elevated heart rate    HPI Dessirae Raina is a 77 y.o. female.   HPI  Nazariah here for chest pain/discomfort. Reports ***    Chest pain began ***  Pain is: *** What worsens the pain: *** What makes the pain better: *** Radiation: *** Patient believes might be causing their pain: *** Improvement with Nitroglycerine: *** Pt does *** take ASA daily.  Treatments tried ***  Follows with *** cardiology.    {Associated Symptoms:25148}  {Patient Denies:25149}  Pt reports *** history of PE, DVT, DM, HTN, cancer, recent surgery ***  Tobacco use ***    *** Fever : no  Chills: no Sore throat: no   Cough: no Sputum: no Nasal congestion : no  Appetite: normal  Hydration: normal  Abdominal pain: no Nausea: no Vomiting: no Dysuria: no  Sleep disturbance: no Back Pain: no Headache: no   Past Medical History:  Diagnosis Date   Anemia    Arthritis    osteoarthriist   Cancer (HCC)    skin   Carotid stenosis, asymptomatic, bilateral    Cellulitis    Complication of anesthesia    nausea   DVT (deep venous thrombosis) (HCC) 09/2019   Heart murmur    History of methicillin resistant staphylococcus aureus (MRSA) 2020   Mitral valve prolapse    Mitral valve regurgitation    Pericardial effusion    Pleural effusion    PONV (postoperative nausea and vomiting)    Pre-diabetes    Primary osteoarthritis of right knee    Pulmonary embolism (HCC) 2020   Sleep apnea    uses cpap   Tendonitis    outer aspect of right foot    Thyroid nodule    UTI (lower urinary tract infection)    Vitamin B 12 deficiency     Patient Active Problem List   Diagnosis Date Noted   Varicose veins with pain 01/01/2023   History of DVT (deep vein thrombosis) 12/28/2022   Dysuria 06/22/2022   IDA (iron deficiency anemia) 06/22/2022   Hypotension 06/22/2022   Status post total hip replacement, 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HCC) 02/08/2022   Primary osteoarthritis of right hip 02/08/2022   Dog bite of right hand 06/03/2021   Normocytic anemia 06/03/2021   Suppurative tenosynovitis of flexor tendon of right hand 06/03/2021   Flexor tenosynovitis of finger    Osteoporosis 03/31/2021   History of deep vein thrombosis (DVT) of lower extremity 10/20/2020   History of pulmonary embolism 10/20/2020   Lower leg DVT (deep venous thromboembolism), acute, right (HCC) 10/05/2019   Multiple subsegmental pulmonary emboli without acute cor pulmonale (HCC) 10/04/2019   Abnormal mammogram 01/25/2019   Encounter for other physical therapy 01/25/2019   Family history of malignant neoplasm of breast 01/25/2019   Gross hematuria 01/25/2019   Urethral caruncle 01/25/2019   Urinary urgency 01/25/2019   Chronic pain of both knees 10/04/2018   Primary osteoarthritis of right knee 10/04/2018   Degenerative disc disease,  cervical 09/01/2018   Neck pain 07/12/2018   OSA (obstructive sleep apnea) 06/14/2018   Apneic episode 02/23/2018   Vitamin B 12 deficiency 08/10/2017   Anemia due to vitamin B12 deficiency 04/13/2017   Chronic constipation 02/25/2017   Carotid stenosis, asymptomatic, bilateral 10/06/2016   H/O mitral valve prolapse 06/13/2016   Vitamin D deficiency, unspecified 12/21/2015   Chronic right-sided low back pain without sciatica 12/16/2015   Urge urinary incontinence 12/16/2015    Past Surgical History:  Procedure Laterality Date   ABDOMINAL HYSTERECTOMY     APPENDECTOMY     COLONOSCOPY     FRACTURE SURGERY      left wrist   FRACTURE SURGERY Left    wrist   INCISION AND DRAINAGE Right 06/03/2021   Procedure: INCISION AND DRAINAGE-Right Index Finger;  Surgeon: Christena Flake, MD;  Location: [facility];  Service: Orthopedics;  Laterality: Right;   IVC FILTER INSERTION N/A 03/15/2022   Procedure: IVC FILTER INSERTION;  Surgeon: Renford Dills, MD;  Location: [facility];  Service: Cardiovascular;  Laterality: N/A;   IVC FILTER INSERTION N/A 07/18/2023   Procedure: IVC FILTER INSERTION;  Surgeon: Renford Dills, MD;  Location: [facility];  Service: Cardiovascular;  Laterality: N/A;   IVC FILTER REMOVAL N/A 05/31/2022   Procedure: IVC FILTER REMOVAL;  Surgeon: Renford Dills, MD;  Location: [facility];  Service: Cardiovascular;  Laterality: N/A;   KNEE SURGERY Left    debridement   TONSILLECTOMY     TOTAL HIP ARTHROPLASTY Right 03/22/2022   Procedure: TOTAL HIP ARTHROPLASTY;  Surgeon: Christena Flake, MD;  Location: [facility];  Service: Orthopedics;  Laterality: Right;   TOTAL KNEE ARTHROPLASTY Right 07/27/2023   Procedure: TOTAL KNEE ARTHROPLASTY;  Surgeon: Christena Flake, MD;  Location: [facility];  Service: Orthopedics;  Laterality: Right;    OB History   No obstetric history on file.      Home Medications    Prior to Admission medications   Medication Sig Start Date End Date Taking? Authorizing Provider  acetaminophen (TYLENOL) 500 MG tablet Take 500-1,000 mg by mouth every 6 (six) hours as needed for mild pain or moderate pain.   Yes [provider]  apixaban (ELIQUIS) 5 MG TABS tablet Take 1 tablet (5 mg total) by mouth 2 (two) times daily. 07/28/23  Yes Poggi, Excell Seltzer, MD  oxyCODONE (ROXICODONE) 5 MG immediate release tablet Take 1-2 tablets (5-10 mg total) by mouth every 4 (four) hours as needed for moderate pain (pain score 4-6) or severe pain (pain score 7-10). 07/27/23  Yes Poggi, Excell Seltzer, MD  promethazine (PHENERGAN) 25 MG tablet Take by mouth.  08/10/23  Yes [provider]  tiZANidine (ZANAFLEX) 2 MG tablet Take 2 mg by mouth 3 (three) times daily. 05/06/22  Yes [provider]  alendronate (FOSAMAX) 70 MG/75ML solution Take 70 mg by mouth every 7 (seven) days. Take with a full glass of water on an empty stomach.    [provider]  Ascorbic Acid (VITAMIN C) 100 MG tablet Take 100 mg by mouth daily.    [provider]  Cholecalciferol (D-3-5) 125 MCG (5000 UT) capsule Take 5,000 Units by mouth daily.    [provider]  cyanocobalamin (,VITAMIN B-12,) 1000 MCG/ML injection Inject 1,000 mcg into the muscle every 30 (thirty) days.    [provider]  ferrous sulfate 325 (65 FE) MG tablet Take 325 mg by mouth daily with breakfast.  [provider]  Multiple Vitamins-Minerals (MULTIVITAMIN WITH MINERALS) tablet Take 2 tablets by mouth daily. Gummy    [provider]  nitrofurantoin, macrocrystal-monohydrate, (MACROBID) 100 MG capsule Take 100 mg by mouth 2 (two) times daily.    [provider]  ondansetron (ZOFRAN-ODT) 4 MG disintegrating tablet Take 1 tablet (4 mg total) by mouth every 8 (eight) hours as needed for nausea or vomiting. 07/27/23   Poggi, Excell Seltzer, MD  tolterodine (DETROL LA) 4 MG 24 hr capsule Take 1 capsule (4 mg total) by mouth daily. 07/06/23   Alfredo Martinez, MD    Family History Family History  Problem Relation Age of Onset   Breast cancer Mother    Asthma Mother    Lung cancer Father    Heart disease Brother    Heart disease Maternal Grandfather    Throat cancer Maternal Grandfather     Social History Social History   Tobacco Use   Smoking status: Never    Passive exposure: Never   Smokeless tobacco: Never  Vaping Use   Vaping status: Never Used  Substance Use Topics   Alcohol use: No   Drug use: No     Allergies   Codeine, Elemental sulfur, Gabapentin, Levaquin [levofloxacin], Penicillins, Sulfa antibiotics, Latex,  and Sulfur   Review of Systems Review of Systems :negative unless otherwise stated in HPI.      Physical Exam Triage Vital Signs ED Triage Vitals  Encounter Vitals Group     BP 08/12/23 1104 (!) 142/78     Systolic BP Percentile --      Diastolic BP Percentile --      Pulse Rate 08/12/23 1104 (!) 122     Resp 08/12/23 1104 (!) 30     Temp 08/12/23 1104 97.7 F (36.5 C)     Temp Source 08/12/23 1104 Oral     SpO2 08/12/23 1104 100 %     Weight --      Height --      Head Circumference --      Peak Flow --      Pain Score 08/12/23 1130 7     Pain Loc --      Pain Education --      Exclude from Growth Chart --    No data found.  Updated Vital Signs BP (!) 142/78 (BP Location: Left Arm)   Pulse (!) 122   Temp 97.7 F (36.5 C) (Oral)   Resp (!) 30   SpO2 100%   Visual Acuity Right Eye Distance:   Left Eye Distance:   Bilateral Distance:    Right Eye Near:   Left Eye Near:    Bilateral Near:     Physical Exam  GEN: pleasant well appearing female, in no acute distress *** CV: regular rate and rhythm,  no murmurs, rubs or gallops  appreciated,, no JVP *** CHEST WALL: *** RESP: no increased work of breathing, clear to ascultation bilaterally ABD: Bowel sounds present. Soft, non-tender, non-distended. *** MSK: no edema, or calf tenderness SKIN: warm, dry, no rash on visible skin NEURO: alert, moves all extremities appropriately PSYCH: Normal affect, appropriate speech and behavior   UC Treatments / Results  Labs (all labs ordered are listed, but only abnormal results are displayed) Labs Reviewed - No data to display  EKG  See my EKG interpretation in the MDM section  Radiology No results found.   Procedures Procedures (including critical care time)  Medications Ordered in UC Medications  ondansetron [facility]) injection 4 mg (4 mg Intramuscular Given 08/12/23 1129)    Initial Impression / Assessment and Plan / UC Course  I have reviewed the triage  vital signs and the nursing notes.  Pertinent labs & imaging results that were available during my care of the patient were reviewed by me and considered in my medical decision making (see chart for details).       Patient is a 77 y.o. female with history of ***  who presents with *** day of ***sided chest pain.  Differential diagnosis includes, but is not limited to, ACS, aortic dissection, pulmonary embolism, cardiac tamponade, pneumothorax, pneumonia, pericarditis/myocarditis, GI-related causes including esophagitis/gastritis, and musculoskeletal chest wall pain   Marcelia has ***no history of PE.  Able to [facility] pt out therefore doubt PE.  EKG obtained and is without ST elevations or concern for ischemia, ***NSR.  CBC unremarkable for anemia.  There were no electrolyte abnormalitis seen on CMP.  Chest x-ray did not show bacterial pneumonia, pleural effusions or pneumothorax. Initial hsTrop was negative.  I do not think his chest pain is cardiopulmonary related. No GERD symptoms. Not likely illicit drug induced. No other anxiety signs or symptoms. Exam not concerning for costochondritis however pain is reproducible, on exam.  This does not appear to be MSK related.     She does *** use marijuana and this could be drug-related.  -***-Initial cardiac HS troponin negative. HEART pathway score is ***, indicating that the patient has an elevated risk score and requires further evaluation.  It may anxiety. Pt was given*** Atarax 25 mg 3 times daily as needed and was advised to follow with his primary care provider, if this medication helps their chest discomfort.  Discussed MDM, treatment plan and plan for follow-up with patient/parent who agrees with plan.    *** PE: History of PE ***. Unable to [facility] her out.  Wells score ***.  Obtained STAT D-dimer.  Satting well on room air. Advised to go to the ED for CTA Chest, if elevated.  Anxiety: *** ACS: EKG obtained without ST elevations or concern for ischemia  at this time. MSK/Costochondritis: pain ***not reproducible on exam.  Anemia: ***History of fibroids/abnormal uterine bleeding.  Obtained CBC Electrolyte abnormalities: obtain CMP Thyroid dysregulation: Consider TSH with reflex T4.       Final Clinical Impressions(s) / UC Diagnoses   Final diagnoses:  DOE (dyspnea on exertion)  Nausea  Lower abdominal pain  History of pulmonary embolus (PE)  History of DVT (deep vein thrombosis)  Recent surgical procedure on lower extremity     Discharge Instructions      You have been advised to follow up immediately in the emergency department for concerning signs or symptoms as discussed during your visit. If you declined EMS transport, please have a family member take you directly to the ED at this time. Do not delay.   Based on concerns about condition, if you do not follow up in the ED, you may risk poor outcomes including worsening of condition, delayed treatment and potentially life threatening issues. If you have declined to go to the ED at this time, you should call your PCP immediately to set up a follow up appointment.     ED Prescriptions   None    PDMP not reviewed this encounter.

## 2023-08-12 NOTE — ED Provider Notes (Signed)
[facility] Provider Note    Event Date/Time   First MD Initiated Contact with Patient 08/12/23 1223     (approximate)   History   Shortness of Breath   HPI  Dana Bautista is a 77 year old female with history of PE presenting to the Emergency Department for evaluation of shortness of breath and nausea.  Had knee surgery at the end of October.  Initially was dealing with some issues with constipation and has more recently developed nausea.  Over the past few days she additionally reports she has had shortness of breath worse with exertion.  She did hold her anticoagulation for 3 days prior to her surgery, but reports she has an IVC filter in place.     Physical Exam   Triage Vital Signs: ED Triage Vitals  Encounter Vitals Group     BP 08/12/23 1209 100/68     Systolic BP Percentile --      Diastolic BP Percentile --      Pulse Rate 08/12/23 1209 96     Resp 08/12/23 1209 (!) 26     Temp 08/12/23 1206 98.1 F (36.7 C)     Temp Source 08/12/23 1206 Oral     SpO2 08/12/23 1209 95 %     Weight 08/12/23 1253 167 lb (75.8 kg)     Height 08/12/23 1253 5\' 6"  (1.676 m)     Head Circumference --      Peak Flow --      Pain Score 08/12/23 1207 3     Pain Loc --      Pain Education --      Exclude from Growth Chart --     Most recent vital signs: Vitals:   08/12/23 1209 08/12/23 1340  BP: 100/68 116/61  Pulse: 96 76  Resp: (!) 26 12  Temp:    SpO2: 95% 100%     General: Awake, interactive  CV:  Regular rate, good peripheral perfusion.  Resp:  Respirations mildly labored, but lungs clear to auscultation Abd:  Nondistended, soft, mild generalized tenderness without rebound or guarding Neuro:  Symmetric facial movement, fluid speech   ED Results / Procedures / Treatments   Labs (all labs ordered are listed, but only abnormal results are displayed) Labs Reviewed  BASIC METABOLIC PANEL - Abnormal; Notable for the following components:      Result  Value   Sodium 127 (*)    Chloride 95 (*)    CO2 20 (*)    Glucose, Bld 111 (*)    All other components within normal limits  CBC - Abnormal; Notable for the following components:   Platelets 410 (*)    All other components within normal limits  HEPATIC FUNCTION PANEL  TROPONIN I (HIGH SENSITIVITY)     EKG EKG independently reviewed interpreted by myself (ER attending) demonstrates:  EKG demonstrates sinus rhythm at a rate of 79, PR 205, QRS 84, QTc 435, no acute ST changes  RADIOLOGY Imaging independently reviewed and interpreted by myself demonstrates:  CXR without focal consolidation CTA of the chest without evidence of PE CT abdomen pelvis without bowel obstruction, radiology does not note any other acute findings  PROCEDURES:  Critical Care performed: No  Procedures   MEDICATIONS ORDERED IN ED: Medications  iohexol (OMNIPAQUE) 350 MG/ML injection 100 mL (100 mLs Intravenous Contrast Given 08/12/23 1311)  sodium chloride 0.9 % bolus 1,000 mL (1,000 mLs Intravenous New Bag/Given 08/12/23 1521)  metoCLOPramide (REGLAN) injection 10 mg (  10 mg Intravenous Given 08/12/23 1517)     IMPRESSION / MDM / ASSESSMENT AND PLAN / ED COURSE  I reviewed the triage vital signs and the nursing notes.  Differential diagnosis includes, but is not limited to, pneumonia, PE, pneumothorax, anemia, electrolyte abnormality, bowel obstruction, postoperative ileus  Patient's presentation is most consistent with acute presentation with potential threat to life or bodily function.  77 year old female presenting to the emergency department for evaluation of shortness of breath and abdominal pain.  Tachypneic on presentation, improved on reevaluation.  Labs notable for hyponatremia with a sodium of 127.  troponin negative and several days of symptoms.  Imaging fortunately overall reassuring against an emergency cause for her symptoms.  Specifically no evidence of PE, bowel obstruction, ileus.   Patient updated on the results of her workup.  Suspect possible component of hypovolemia contributing to her hyponatremia.  Will give patient a liter of fluid and trial Reglan as she reports that prior antiemetics have not been very effective.  Signed out to oncoming provider pending reevaluation following medication.  Suspect patient will be able to be discharged with outpatient follow-up if feeling improved.     FINAL CLINICAL IMPRESSION(S) / ED DIAGNOSES   Final diagnoses:  Shortness of breath  Nausea  Hyponatremia     Rx / DC Orders   ED Discharge Orders     None        Note:  This document was prepared using Dragon voice recognition software and may include unintentional dictation errors.   Trinna Post, MD 08/12/23 [phone]

## 2023-08-12 NOTE — Discharge Instructions (Signed)

## 2023-08-12 NOTE — ED Notes (Signed)
Gave water for PO challenge

## 2023-08-12 NOTE — ED Notes (Signed)
Pt walked to toilet and back to void. Back in bed.

## 2023-08-12 NOTE — ED Triage Notes (Addendum)
Pt to ED via POV from [facility]. Pt reports the last few days with increasing SOB w/ exertion and elevated HR. Pt also reports nausea. Pt states hx of Pe and DVTs and is on blood thinner. Pt had knee replacement on 10/31 and stopped blood thinner prior to surgery. Pt clenching her chest in triage.

## 2023-08-12 NOTE — ED Triage Notes (Addendum)
Hx of blood clots. Patient has knee replacement 10-31. Patient comes in with SOB-elevated heart rate.that started yesterday.  Nausea. GI sx. Diarrhea

## 2023-08-12 NOTE — ED Notes (Signed)
Patient is being discharged from the Urgent Care and sent to the [facility] Emergency Department via private vehicle with spouse . Per Dr. Rachael Darby, patient is in need of higher level of care due to post-surgical SOB, nasuea, and constipation. Patient is aware and verbalizes understanding of plan of care.  Vitals:   08/12/23 1104  BP: (!) 142/78  Pulse: (!) 122  Resp: (!) 30  Temp: 97.7 F (36.5 C)  SpO2: 100%

## 2023-08-14 ENCOUNTER — Other Ambulatory Visit: Payer: Medicare Other

## 2023-08-16 ENCOUNTER — Ambulatory Visit: Payer: Medicare Other

## 2023-08-16 ENCOUNTER — Ambulatory Visit: Payer: Medicare Other | Admitting: Oncology

## 2023-09-04 ENCOUNTER — Encounter: Payer: Self-pay | Admitting: Oncology

## 2023-09-08 ENCOUNTER — Other Ambulatory Visit: Payer: Self-pay | Admitting: Oncology

## 2023-09-11 ENCOUNTER — Inpatient Hospital Stay: Payer: Medicare Other | Attending: Oncology

## 2023-09-11 ENCOUNTER — Telehealth: Payer: Self-pay | Admitting: Oncology

## 2023-09-11 DIAGNOSIS — Z803 Family history of malignant neoplasm of breast: Secondary | ICD-10-CM | POA: Insufficient documentation

## 2023-09-11 DIAGNOSIS — Z86718 Personal history of other venous thrombosis and embolism: Secondary | ICD-10-CM | POA: Insufficient documentation

## 2023-09-11 DIAGNOSIS — D509 Iron deficiency anemia, unspecified: Secondary | ICD-10-CM | POA: Diagnosis present

## 2023-09-11 DIAGNOSIS — Z801 Family history of malignant neoplasm of trachea, bronchus and lung: Secondary | ICD-10-CM | POA: Insufficient documentation

## 2023-09-11 DIAGNOSIS — Z86711 Personal history of pulmonary embolism: Secondary | ICD-10-CM | POA: Diagnosis not present

## 2023-09-11 DIAGNOSIS — Z800 Family history of malignant neoplasm of digestive organs: Secondary | ICD-10-CM | POA: Insufficient documentation

## 2023-09-11 DIAGNOSIS — Z7901 Long term (current) use of anticoagulants: Secondary | ICD-10-CM | POA: Insufficient documentation

## 2023-09-11 DIAGNOSIS — D508 Other iron deficiency anemias: Secondary | ICD-10-CM

## 2023-09-11 LAB — CBC WITH DIFFERENTIAL (CANCER CENTER ONLY)
Abs Immature Granulocytes: 0.01 10*3/uL (ref 0.00–0.07)
Basophils Absolute: 0 10*3/uL (ref 0.0–0.1)
Basophils Relative: 0 %
Eosinophils Absolute: 0.1 10*3/uL (ref 0.0–0.5)
Eosinophils Relative: 2 %
HCT: 33.1 % — ABNORMAL LOW (ref 36.0–46.0)
Hemoglobin: 10.9 g/dL — ABNORMAL LOW (ref 12.0–15.0)
Immature Granulocytes: 0 %
Lymphocytes Relative: 34 %
Lymphs Abs: 1.6 10*3/uL (ref 0.7–4.0)
MCH: 28.9 pg (ref 26.0–34.0)
MCHC: 32.9 g/dL (ref 30.0–36.0)
MCV: 87.8 fL (ref 80.0–100.0)
Monocytes Absolute: 0.5 10*3/uL (ref 0.1–1.0)
Monocytes Relative: 10 %
Neutro Abs: 2.5 10*3/uL (ref 1.7–7.7)
Neutrophils Relative %: 54 %
Platelet Count: 289 10*3/uL (ref 150–400)
RBC: 3.77 MIL/uL — ABNORMAL LOW (ref 3.87–5.11)
RDW: 13.2 % (ref 11.5–15.5)
WBC Count: 4.6 10*3/uL (ref 4.0–10.5)
nRBC: 0 % (ref 0.0–0.2)

## 2023-09-11 LAB — RETIC PANEL
Immature Retic Fract: 5.1 % (ref 2.3–15.9)
RBC.: 3.72 MIL/uL — ABNORMAL LOW (ref 3.87–5.11)
Retic Count, Absolute: 46.1 10*3/uL (ref 19.0–186.0)
Retic Ct Pct: 1.2 % (ref 0.4–3.1)
Reticulocyte Hemoglobin: 33 pg (ref >27.9)

## 2023-09-11 LAB — IRON AND TIBC
Iron: 69 ug/dL (ref 28–170)
Saturation Ratios: 23 % (ref 10.4–31.8)
TIBC: 297 ug/dL (ref 250–450)
UIBC: 228 ug/dL

## 2023-09-11 LAB — FERRITIN: Ferritin: 128 ng/mL (ref 11–307)

## 2023-09-11 NOTE — Telephone Encounter (Signed)
On 12/18 patient has appointment with Dr. Cathie Hoops and infusion, she also has a cardiology appointment on same day and cant stay for infusion she would like to reschedule that when she is here for the MD appointment

## 2023-09-13 ENCOUNTER — Encounter: Payer: Self-pay | Admitting: Oncology

## 2023-09-13 ENCOUNTER — Inpatient Hospital Stay (HOSPITAL_BASED_OUTPATIENT_CLINIC_OR_DEPARTMENT_OTHER): Payer: Medicare Other | Admitting: Oncology

## 2023-09-13 ENCOUNTER — Ambulatory Visit: Payer: Medicare Other

## 2023-09-13 VITALS — BP 115/61 | HR 73 | Temp 97.2°F | Resp 18 | Wt 165.0 lb

## 2023-09-13 DIAGNOSIS — Z86718 Personal history of other venous thrombosis and embolism: Secondary | ICD-10-CM

## 2023-09-13 DIAGNOSIS — D509 Iron deficiency anemia, unspecified: Secondary | ICD-10-CM | POA: Diagnosis not present

## 2023-09-13 DIAGNOSIS — D508 Other iron deficiency anemias: Secondary | ICD-10-CM

## 2023-09-13 MED ORDER — APIXABAN 2.5 MG PO TABS: 2.5000 mg | ORAL_TABLET | Freq: Two times a day (BID) | ORAL | 1 refills | Status: DC

## 2023-09-13 NOTE — Assessment & Plan Note (Addendum)
Labs are reviewed and discussed with patient. She has finished post surgery course of Eliquis 5mg  BID Continue Eliquis 2.5 mg twice daily.  Stable hemoglobin.

## 2023-09-13 NOTE — Assessment & Plan Note (Addendum)
Hemoglobin is slightly decreased, likely due to blood loss form recent surgery Labs are reviewed and discussed with patient. Lab Results  Component Value Date   HGB 10.9 (L) 09/11/2023   TIBC 297 09/11/2023   IRONPCTSAT 23 09/11/2023   FERRITIN 128 09/11/2023    Iron panel is not typical for iron deficiency anemia. Iron panel may be falsely elevated due to acute inflammation.  Hold off Venofer. Close monitor counts. Repeat cbc in 4 weeks.

## 2023-09-13 NOTE — Progress Notes (Signed)
Hematology/Oncology Progress note [phone] [phone]      Patient Care Team: Patrice Paradise, MD as PCP - General (Physician Assistant) Rickard Patience, MD as Consulting Physician (Oncology)  CHIEF COMPLAINTS/REASON FOR VISIT:  Follow up for pulmonary  embolism, anemia.    ASSESSMENT & PLAN:   History of deep vein thrombosis (DVT) of lower extremity Labs are reviewed and discussed with patient. She has finished post surgery course of Eliquis 5mg  BID Continue Eliquis 2.5 mg twice daily.  Stable hemoglobin.  IDA (iron deficiency anemia) Hemoglobin is slightly decreased, likely due to blood loss form recent surgery Labs are reviewed and discussed with patient. Lab Results  Component Value Date   HGB 10.9 (L) 09/11/2023   TIBC 297 09/11/2023   IRONPCTSAT 23 09/11/2023   FERRITIN 128 09/11/2023    Iron panel is not typical for iron deficiency anemia. Iron panel may be falsely elevated due to acute inflammation.  Hold off Venofer. Close monitor counts. Repeat cbc in 4 weeks.   Orders Placed This Encounter  Procedures   CBC with Differential/Platelet    Standing Status:   Future    Expected Date:   10/11/2023    Expiration Date:   09/12/2024   Retic Panel    Standing Status:   Future    Expected Date:   10/11/2023    Expiration Date:   09/12/2024   Iron and TIBC    Standing Status:   Future    Expected Date:   10/11/2023    Expiration Date:   09/12/2024   Ferritin    Standing Status:   Future    Expected Date:   10/11/2023    Expiration Date:   03/13/2024   Vitamin B12    Standing Status:   Future    Expected Date:   10/11/2023    Expiration Date:   09/12/2024   Folate    Standing Status:   Future    Expected Date:   10/11/2023    Expiration Date:   09/12/2024   Protein electrophoresis, serum    Standing Status:   Future    Expected Date:   10/11/2023    Expiration Date:   09/12/2024   CMP (Cancer Center only)    Standing Status:   Future     Expected Date:   10/11/2023    Expiration Date:   09/12/2024   Follow-up TBD All questions were answered. The patient knows to call the clinic with any problems, questions or concerns.  Rickard Patience, MD, PhD [facility] Hematology Oncology 09/13/2023     HISTORY OF PRESENTING ILLNESS:   Dana Bautista is a  77 y.o.  female with PMH listed below was seen in consultation at the request of  Patrice Paradise, MD  for evaluation of pulmonary embolism  Patient was admitted from 10/04/2019-10/05/2019 due to acute bilateral pulmonary embolism and DVT.  Patient denies any immobilization factors prior to the events.  Her initial symptom was sudden onset of left-sided chest pain. 10/04/2019, CT chest angiogram showed acute bilateral lower lobe pulmonary embolism.  No right heart strain.  Small left pleural effusion. 10/05/2019 bilateral lower extremity ultrasound showed right calf posterior calf occlusive DVT.  Very low thrombus burden.  Patient was started on anticoagulation and discharged on Eliquis. She has been on anticoagulation for slightly more than 6 months. She was referred to hematology for evaluation management. Patient reports that the left chest pain has completely resolved.  She was less active since  the diagnosis of pulmonary embolism and has been deconditioned.  She has tried to exercise more and her exercise endurance has improved. Intermittently she experienced left lower extremity swelling. She denies any constitutional symptoms.  July 2021 -switched to Eliquis 2.5mg  BID  INTERVAL HISTORY Dana Bautista is a 77 y.o. female who has above history reviewed by me today presents for follow up visit for management of history of thrombosis.  07/27/23 S/p right TKA. She finished course of Eliquis 5mg  BID. Now she takes Eliquis 2.5 mg twice daily.  No bleeding events. No new complains today. She gets monthly B12 injections at her PCP's office.   Review of Systems  Constitutional:  Positive for fatigue.  Negative for appetite change, chills and fever.  HENT:   Negative for hearing loss and voice change.   Eyes:  Negative for eye problems.  Respiratory:  Negative for chest tightness and cough.   Cardiovascular:  Negative for chest pain.  Gastrointestinal:  Negative for abdominal distention, abdominal pain and blood in stool.  Endocrine: Negative for hot flashes.  Genitourinary:  Negative for difficulty urinating and frequency.   Musculoskeletal:  Negative for arthralgias.  Skin:  Negative for itching and rash.  Neurological:  Negative for extremity weakness.  Hematological:  Negative for adenopathy.  Psychiatric/Behavioral:  Negative for confusion.     MEDICAL HISTORY:  Past Medical History:  Diagnosis Date   Anemia    Arthritis    osteoarthriist   Cancer (HCC)    skin   Carotid stenosis, asymptomatic, bilateral    Cellulitis    Complication of anesthesia    nausea   DVT (deep venous thrombosis) (HCC) 09/2019   Heart murmur    History of methicillin resistant staphylococcus aureus (MRSA) 2020   Mitral valve prolapse    Mitral valve regurgitation    Pericardial effusion    Pleural effusion    PONV (postoperative nausea and vomiting)    Pre-diabetes    Primary osteoarthritis of right knee    Pulmonary embolism (HCC) 2020   Sleep apnea    uses cpap   Tendonitis    outer aspect of right foot   Thyroid nodule    UTI (lower urinary tract infection)    Vitamin B 12 deficiency     SURGICAL HISTORY: Past Surgical History:  Procedure Laterality Date   ABDOMINAL HYSTERECTOMY     APPENDECTOMY     COLONOSCOPY     FRACTURE SURGERY     left wrist   FRACTURE SURGERY Left    wrist   INCISION AND DRAINAGE Right 06/03/2021   Procedure: INCISION AND DRAINAGE-Right Index Finger;  Surgeon: Christena Flake, MD;  Location: [facility];  Service: Orthopedics;  Laterality: Right;   IVC FILTER INSERTION N/A 03/15/2022   Procedure: IVC FILTER INSERTION;  Surgeon: Renford Dills, MD;   Location: [facility];  Service: Cardiovascular;  Laterality: N/A;   IVC FILTER INSERTION N/A 07/18/2023   Procedure: IVC FILTER INSERTION;  Surgeon: Renford Dills, MD;  Location: [facility];  Service: Cardiovascular;  Laterality: N/A;   IVC FILTER REMOVAL N/A 05/31/2022   Procedure: IVC FILTER REMOVAL;  Surgeon: Renford Dills, MD;  Location: [facility];  Service: Cardiovascular;  Laterality: N/A;   KNEE SURGERY Left    debridement   TONSILLECTOMY     TOTAL HIP ARTHROPLASTY Right 03/22/2022   Procedure: TOTAL HIP ARTHROPLASTY;  Surgeon: Christena Flake, MD;  Location: [facility];  Service: Orthopedics;  Laterality: Right;   TOTAL KNEE ARTHROPLASTY Right 07/27/2023   Procedure: TOTAL KNEE ARTHROPLASTY;  Surgeon: Christena Flake, MD;  Location: [facility];  Service: Orthopedics;  Laterality: Right;    SOCIAL HISTORY: Social History   Socioeconomic History   Marital status: Married    Spouse name: Not on file   Number of children: Not on file   Years of education: Not on file   Highest education level: Not on file  Occupational History   Not on file  Tobacco Use   Smoking status: Never    Passive exposure: Never   Smokeless tobacco: Never  Vaping Use   Vaping status: Never Used  Substance and Sexual Activity   Alcohol use: No   Drug use: No   Sexual activity: Not on file  Other Topics Concern   Not on file  Social History Narrative   Lives at home with husband. Still teaches English online .   Social Drivers of Corporate investment banker Strain: Low Risk  (08/23/2023)   Received [facility]   Overall Financial Resource Strain (CARDIA)    Difficulty of Paying Living Expenses: Not hard at all  Food Insecurity: No Food Insecurity (08/23/2023)   Received from [facility]   Hunger Vital Sign    Worried About Running Out of Food in the Last Year: Never true    Ran Out of Food in the Last Year: Never true   Transportation Needs: No Transportation Needs (08/23/2023)   Received from [facility] - Transportation    In the past 12 months, has lack of transportation kept you from medical appointments or from getting medications?: No    Lack of Transportation (Non-Medical): No  Physical Activity: Not on file  Stress: Not on file  Social Connections: Not on file  Intimate Partner Violence: Not on file    FAMILY HISTORY: Family History  Problem Relation Age of Onset   Breast cancer Mother    Asthma Mother    Lung cancer Father    Heart disease Brother    Heart disease Maternal Grandfather    Throat cancer Maternal Grandfather     ALLERGIES:  is allergic to codeine, elemental sulfur, gabapentin, levaquin [levofloxacin], penicillins, sulfa antibiotics, latex, and sulfur.  MEDICATIONS:  Current Outpatient Medications  Medication Sig Dispense Refill   acetaminophen (TYLENOL) 500 MG tablet Take 500-1,000 mg by mouth every 6 (six) hours as needed for mild pain or moderate pain.     alendronate (FOSAMAX) 70 MG/75ML solution Take 70 mg by mouth every 7 (seven) days. Take with a full glass of water on an empty stomach.     Cholecalciferol (D-3-5) 125 MCG (5000 UT) capsule Take 5,000 Units by mouth daily.     cyanocobalamin (,VITAMIN B-12,) 1000 MCG/ML injection Inject 1,000 mcg into the muscle every 30 (thirty) days.     ferrous sulfate 325 (65 FE) MG tablet Take 325 mg by mouth daily with breakfast.     Multiple Vitamins-Minerals (MULTIVITAMIN WITH MINERALS) tablet Take 2 tablets by mouth daily. Gummy     tiZANidine (ZANAFLEX) 2 MG tablet Take 2 mg by mouth 3 (three) times daily.     tolterodine (DETROL LA) 4 MG 24 hr capsule Take 1 capsule (4 mg total) by mouth daily. 30 capsule 11   apixaban (ELIQUIS) 2.5 MG TABS tablet Take 1 tablet (2.5 mg total) by mouth 2 (two) times daily. 180 tablet 1  No current facility-administered medications for this visit.      PHYSICAL EXAMINATION: ECOG PERFORMANCE STATUS: 1 - Symptomatic but completely ambulatory Vitals:   09/13/23 1512  BP: 115/61  Pulse: 73  Resp: 18  Temp: (!) 97.2 F (36.2 C)  SpO2: 100%   Filed Weights   09/13/23 1512  Weight: 165 lb (74.8 kg)    Physical Exam Constitutional:      General: She is not in acute distress. HENT:     Head: Normocephalic and atraumatic.  Eyes:     General: No scleral icterus. Cardiovascular:     Rate and Rhythm: Normal rate and regular rhythm.  Pulmonary:     Effort: Pulmonary effort is normal. No respiratory distress.     Breath sounds: No wheezing.  Abdominal:     General: Bowel sounds are normal.     Palpations: Abdomen is soft.  Musculoskeletal:        General: No deformity. Normal range of motion.     Cervical back: Normal range of motion and neck supple.     Comments: Trace edema, bilateral lower extremities.  varicose vein bilaterally.   Skin:    General: Skin is warm and dry.     Findings: No erythema or rash.  Neurological:     Mental Status: She is alert and oriented to person, place, and time. Mental status is at baseline.  Psychiatric:        Mood and Affect: Mood normal.     LABORATORY DATA:  I have reviewed the data as listed Lab Results  Component Value Date   WBC 4.6 09/11/2023   HGB 10.9 (L) 09/11/2023   HCT 33.1 (L) 09/11/2023   MCV 87.8 09/11/2023   PLT 289 09/11/2023   Recent Labs    01/25/23 1000 07/20/23 1227 08/12/23 1210  NA 133* 135 127*  K 3.7 4.1 3.6  CL 102 101 95*  CO2 25 25 20*  GLUCOSE 93 82 111*  BUN 17 16 10   CREATININE 0.58 0.59 0.62  CALCIUM 8.8* 9.2 9.1  GFRNONAA >60 >60 >60  PROT 6.7 7.0 7.1  ALBUMIN 3.9 4.3 4.3  AST 15 17 17   ALT 12 12 13   ALKPHOS 59 47 72  BILITOT 0.9 1.0 1.1  BILIDIR  --   --  0.2  IBILI  --   --  0.9   Iron/TIBC/Ferritin/ %Sat    Component Value Date/Time   IRON 69 09/11/2023 0910   TIBC 297 09/11/2023 0910   FERRITIN 128 09/11/2023 0910    IRONPCTSAT 23 09/11/2023 0910    hypercoagulable state work up.  Negative prothrombin gene mutation, Factor V leiden mutation.  Negative anticardiolipin IgM, indeterminate level of anticardiolipin IgG, not meeting antiphospholipid syndrome diagnosis criteria.   RADIOGRAPHIC STUDIES: I have personally reviewed the radiological images as listed and agreed with the findings in the report. No results found.

## 2023-09-26 ENCOUNTER — Telehealth (INDEPENDENT_AMBULATORY_CARE_PROVIDER_SITE_OTHER): Payer: Self-pay

## 2023-09-26 NOTE — Telephone Encounter (Signed)
 Patient left a message stating she wanted to have a IVC filter removed. I returned the call and left a voicemail for a callback.

## 2023-09-29 ENCOUNTER — Telehealth (INDEPENDENT_AMBULATORY_CARE_PROVIDER_SITE_OTHER): Payer: Self-pay

## 2023-09-29 NOTE — Telephone Encounter (Signed)
I attempted to contact the patient to schedule a IVC filter removal with Dr. Gilda Crease. A message was left for a return call.

## 2023-10-05 ENCOUNTER — Telehealth (INDEPENDENT_AMBULATORY_CARE_PROVIDER_SITE_OTHER): Payer: Self-pay

## 2023-10-05 NOTE — Telephone Encounter (Signed)
 Spoke with the patient and she is scheduled with Dr. Gilda Crease for a IVC filer removal at the [facility], with a 6:45 am arrival time. Pre-procedure instructions were discussed and will be sent to Mychart and mailed.

## 2023-10-11 ENCOUNTER — Inpatient Hospital Stay: Payer: Medicare Other | Attending: Oncology

## 2023-10-11 DIAGNOSIS — Z86718 Personal history of other venous thrombosis and embolism: Secondary | ICD-10-CM | POA: Insufficient documentation

## 2023-10-11 DIAGNOSIS — D508 Other iron deficiency anemias: Secondary | ICD-10-CM

## 2023-10-11 DIAGNOSIS — D509 Iron deficiency anemia, unspecified: Secondary | ICD-10-CM | POA: Diagnosis present

## 2023-10-11 DIAGNOSIS — Z7901 Long term (current) use of anticoagulants: Secondary | ICD-10-CM | POA: Diagnosis not present

## 2023-10-11 LAB — CBC WITH DIFFERENTIAL/PLATELET
Abs Immature Granulocytes: 0.05 10*3/uL (ref 0.00–0.07)
Basophils Absolute: 0 10*3/uL (ref 0.0–0.1)
Basophils Relative: 1 %
Eosinophils Absolute: 0.1 10*3/uL (ref 0.0–0.5)
Eosinophils Relative: 3 %
HCT: 33.9 % — ABNORMAL LOW (ref 36.0–46.0)
Hemoglobin: 10.7 g/dL — ABNORMAL LOW (ref 12.0–15.0)
Immature Granulocytes: 1 %
Lymphocytes Relative: 30 %
Lymphs Abs: 1.4 10*3/uL (ref 0.7–4.0)
MCH: 28.6 pg (ref 26.0–34.0)
MCHC: 31.6 g/dL (ref 30.0–36.0)
MCV: 90.6 fL (ref 80.0–100.0)
Monocytes Absolute: 0.4 10*3/uL (ref 0.1–1.0)
Monocytes Relative: 8 %
Neutro Abs: 2.7 10*3/uL (ref 1.7–7.7)
Neutrophils Relative %: 57 %
Platelets: 266 10*3/uL (ref 150–400)
RBC: 3.74 MIL/uL — ABNORMAL LOW (ref 3.87–5.11)
RDW: 13.4 % (ref 11.5–15.5)
WBC: 4.7 10*3/uL (ref 4.0–10.5)
nRBC: 0 % (ref 0.0–0.2)

## 2023-10-11 LAB — IRON AND TIBC
Iron: 66 ug/dL (ref 28–170)
Saturation Ratios: 20 % (ref 10.4–31.8)
TIBC: 329 ug/dL (ref 250–450)
UIBC: 263 ug/dL

## 2023-10-11 LAB — FOLATE: Folate: 12.6 ng/mL (ref >5.9)

## 2023-10-11 LAB — CMP (CANCER CENTER ONLY)
ALT: 13 U/L (ref 0–44)
AST: 15 U/L (ref 15–41)
Albumin: 4 g/dL (ref 3.5–5.0)
Alkaline Phosphatase: 51 U/L (ref 38–126)
Anion gap: 8 (ref 5–15)
BUN: 18 mg/dL (ref 8–23)
CO2: 25 mmol/L (ref 22–32)
Calcium: 9 mg/dL (ref 8.9–10.3)
Chloride: 105 mmol/L (ref 98–111)
Creatinine: 0.53 mg/dL (ref 0.44–1.00)
GFR, Estimated: >60 mL/min (ref >60)
Glucose, Bld: 101 mg/dL — ABNORMAL HIGH (ref 70–99)
Potassium: 4.2 mmol/L (ref 3.5–5.1)
Sodium: 138 mmol/L (ref 135–145)
Total Bilirubin: 0.8 mg/dL (ref 0.0–1.2)
Total Protein: 6.4 g/dL — ABNORMAL LOW (ref 6.5–8.1)

## 2023-10-11 LAB — FERRITIN: Ferritin: 73 ng/mL (ref 11–307)

## 2023-10-11 LAB — RETIC PANEL
Immature Retic Fract: 2.9 % (ref 2.3–15.9)
RBC.: 3.69 MIL/uL — ABNORMAL LOW (ref 3.87–5.11)
Retic Count, Absolute: 52 10*3/uL (ref 19.0–186.0)
Retic Ct Pct: 1.4 % (ref 0.4–3.1)
Reticulocyte Hemoglobin: 32 pg (ref >27.9)

## 2023-10-11 LAB — VITAMIN B12: Vitamin B-12: 239 pg/mL (ref 180–914)

## 2023-10-12 ENCOUNTER — Encounter: Payer: Self-pay | Admitting: Oncology

## 2023-10-13 ENCOUNTER — Encounter: Payer: Self-pay | Admitting: Oncology

## 2023-10-18 LAB — PROTEIN ELECTROPHORESIS, SERUM
A/G Ratio: 1.6 (ref 0.7–1.7)
Albumin ELP: 3.8 g/dL (ref 2.9–4.4)
Alpha-1-Globulin: 0.2 g/dL (ref 0.0–0.4)
Alpha-2-Globulin: 0.7 g/dL (ref 0.4–1.0)
Beta Globulin: 1 g/dL (ref 0.7–1.3)
Gamma Globulin: 0.5 g/dL (ref 0.4–1.8)
Globulin, Total: 2.4 g/dL (ref 2.2–3.9)
Total Protein ELP: 6.2 g/dL (ref 6.0–8.5)

## 2023-10-20 ENCOUNTER — Encounter: Payer: Self-pay | Admitting: Oncology

## 2023-10-24 ENCOUNTER — Encounter: Payer: Self-pay | Admitting: Vascular Surgery

## 2023-10-24 ENCOUNTER — Other Ambulatory Visit: Payer: Self-pay

## 2023-10-24 ENCOUNTER — Encounter
Admission: RE | Disposition: A | Discharge status: Home / Self Care | Payer: Self-pay | Source: Home / Self Care | Attending: Vascular Surgery

## 2023-10-24 ENCOUNTER — Ambulatory Visit
Admission: RE | Admit: 2023-10-24 | Discharge: 2023-10-24 | Disposition: A | Discharge status: Home / Self Care | Payer: Medicare Other | Attending: Vascular Surgery | Admitting: Vascular Surgery

## 2023-10-24 DIAGNOSIS — Z4589 Encounter for adjustment and management of other implanted devices: Secondary | ICD-10-CM | POA: Insufficient documentation

## 2023-10-24 DIAGNOSIS — Z9889 Other specified postprocedural states: Secondary | ICD-10-CM

## 2023-10-24 DIAGNOSIS — I2699 Other pulmonary embolism without acute cor pulmonale: Secondary | ICD-10-CM

## 2023-10-24 DIAGNOSIS — K219 Gastro-esophageal reflux disease without esophagitis: Secondary | ICD-10-CM | POA: Insufficient documentation

## 2023-10-24 DIAGNOSIS — Z96651 Presence of right artificial knee joint: Secondary | ICD-10-CM | POA: Insufficient documentation

## 2023-10-24 DIAGNOSIS — Z86718 Personal history of other venous thrombosis and embolism: Secondary | ICD-10-CM | POA: Insufficient documentation

## 2023-10-24 DIAGNOSIS — I82409 Acute embolism and thrombosis of unspecified deep veins of unspecified lower extremity: Secondary | ICD-10-CM

## 2023-10-24 DIAGNOSIS — Z9660 Presence of unspecified orthopedic joint implant: Secondary | ICD-10-CM | POA: Diagnosis not present

## 2023-10-24 DIAGNOSIS — Z7901 Long term (current) use of anticoagulants: Secondary | ICD-10-CM | POA: Diagnosis not present

## 2023-10-24 HISTORY — PX: IVC FILTER REMOVAL: CATH118246

## 2023-10-24 SURGERY — IVC FILTER REMOVAL
Anesthesia: Moderate Sedation

## 2023-10-24 MED ORDER — FENTANYL CITRATE (PF) 100 MCG/2ML IJ SOLN
INTRAMUSCULAR | Status: DC | PRN
  Administered 2023-10-24 (×2): 50 ug via INTRAVENOUS

## 2023-10-24 MED ORDER — IODIXANOL 320 MG/ML IV SOLN
INTRAVENOUS | Status: DC | PRN
  Administered 2023-10-24: 20 mL

## 2023-10-24 MED ORDER — MIDAZOLAM HCL 2 MG/ML PO SYRP
8.0000 mg | ORAL_SOLUTION | Freq: Once | ORAL | Status: AC | PRN
  Administered 2023-10-24: 8 mg via ORAL

## 2023-10-24 MED ORDER — LIDOCAINE-EPINEPHRINE (PF) 1 %-1:200000 IJ SOLN
INTRAMUSCULAR | Status: DC | PRN
  Administered 2023-10-24 (×2): 10 mL

## 2023-10-24 MED ORDER — FENTANYL CITRATE PF 50 MCG/ML IJ SOSY
PREFILLED_SYRINGE | INTRAMUSCULAR | Status: AC
  Filled 2023-10-24: qty 1

## 2023-10-24 MED ORDER — ACETAMINOPHEN 500 MG PO TABS
ORAL_TABLET | ORAL | Status: AC
  Administered 2023-10-24: 1000 mg via ORAL
  Filled 2023-10-24: qty 2

## 2023-10-24 MED ORDER — FAMOTIDINE 20 MG PO TABS: 40.0000 mg | ORAL_TABLET | Freq: Once | ORAL | Status: DC | PRN

## 2023-10-24 MED ORDER — CEFAZOLIN SODIUM-DEXTROSE 2-4 GM/100ML-% IV SOLN
2.0000 g | INTRAVENOUS | Status: AC
  Administered 2023-10-24: 2 g via INTRAVENOUS

## 2023-10-24 MED ORDER — CEFAZOLIN SODIUM-DEXTROSE 2-4 GM/100ML-% IV SOLN
INTRAVENOUS | Status: AC
Start: 2023-10-24 — End: ?
  Filled 2023-10-24: qty 100

## 2023-10-24 MED ORDER — ONDANSETRON HCL 4 MG/2ML IJ SOLN
4.0000 mg | Freq: Four times a day (QID) | INTRAMUSCULAR | Status: DC | PRN
  Administered 2023-10-24: 4 mg via INTRAVENOUS

## 2023-10-24 MED ORDER — METHYLPREDNISOLONE SODIUM SUCC 125 MG IJ SOLR: 125.0000 mg | Freq: Once | INTRAMUSCULAR | Status: DC | PRN

## 2023-10-24 MED ORDER — SODIUM CHLORIDE 0.9 % IV SOLN: INTRAVENOUS | Status: DC

## 2023-10-24 MED ORDER — ACETAMINOPHEN 500 MG PO TABS: 1000.0000 mg | ORAL_TABLET | ORAL | Status: AC

## 2023-10-24 MED ORDER — APIXABAN 2.5 MG PO TABS: 2.5000 mg | ORAL_TABLET | Freq: Two times a day (BID) | ORAL | 1 refills | Status: DC

## 2023-10-24 MED ORDER — ONDANSETRON HCL 4 MG/2ML IJ SOLN
INTRAMUSCULAR | Status: AC
  Filled 2023-10-24: qty 2

## 2023-10-24 MED ORDER — DIPHENHYDRAMINE HCL 50 MG/ML IJ SOLN: 50.0000 mg | Freq: Once | INTRAMUSCULAR | Status: DC | PRN

## 2023-10-24 MED ORDER — HEPARIN (PORCINE) IN NACL 1000-0.9 UT/500ML-% IV SOLN
INTRAVENOUS | Status: DC | PRN
  Administered 2023-10-24 (×2): 500 mL

## 2023-10-24 MED ORDER — MIDAZOLAM HCL 2 MG/2ML IJ SOLN
INTRAMUSCULAR | Status: DC | PRN
  Administered 2023-10-24 (×2): 1 mg via INTRAVENOUS

## 2023-10-24 MED ORDER — MIDAZOLAM HCL 2 MG/2ML IJ SOLN
INTRAMUSCULAR | Status: AC
  Filled 2023-10-24: qty 2

## 2023-10-24 MED ORDER — HYDROMORPHONE HCL 1 MG/ML IJ SOLN: 1.0000 mg | Freq: Once | INTRAMUSCULAR | Status: DC | PRN

## 2023-10-24 MED ORDER — MIDAZOLAM HCL 2 MG/ML PO SYRP
ORAL_SOLUTION | ORAL | Status: AC
  Filled 2023-10-24: qty 5

## 2023-10-24 SURGICAL SUPPLY — 8 items
COVER PROBE ULTRASOUND 5X96 (MISCELLANEOUS) IMPLANT
KIT FEM OPTION ELITE FILTER (Filter) IMPLANT
KIT SNARE RETRIEVAL 3-LOOP (MISCELLANEOUS) IMPLANT
NDL ENTRY 21GA 7CM ECHOTIP (NEEDLE) IMPLANT
NEEDLE ENTRY 21GA 7CM ECHOTIP (NEEDLE) ×1
PACK ANGIOGRAPHY (CUSTOM PROCEDURE TRAY) ×1 IMPLANT
SET INTRO CAPELLA COAXIAL (SET/KITS/TRAYS/PACK) IMPLANT
WIRE GUIDERIGHT .035X150 (WIRE) IMPLANT

## 2023-10-24 NOTE — Op Note (Signed)
  OPERATIVE NOTE   PRE-OPERATIVE DIAGNOSIS: DVT with PE status post joint replacement  POST-OPERATIVE DIAGNOSIS: Same  PROCEDURE: Retrieval of IVC Filter Inferior Vena Cavagram  SURGEON: Renford Dills, M.D.  ANESTHESIA:  Conscious sedation was administered under my direct supervision by the interventional radiology RN. IV Versed plus fentanyl were utilized. Continuous ECG, pulse oximetry and blood pressure was monitored throughout the entire procedure. Conscious sedation was for a total of 20 minutes and 43 seconds.  ESTIMATED BLOOD LOSS: Minimal cc  FINDING(S):inferior vena cava is widely patent filter is in place in good position. Filter is removed without incident  SPECIMEN(S):  IVC filter intact  INDICATIONS:   Dana Bautista is a 78 y.o. female who presents with DVT and PE. The patient has now tolerated anticoagulation for several months. Therefore, the IVC filter is recommended to be removed. The risks and benefits were reviewed with the patient all questions were answered and they agreed to proceed with IVC filter retrieval. Oral anticoagulation will be continued.  DESCRIPTION: After obtaining full informed written consent, the patient was brought back to the Special Procedure Suite and placed in the supine position.  The patient received IV antibiotics prior to induction.  After obtaining adequate sedation, the patient was prepped and draped in the standard fashion and appropriate time out is called.     Ultrasound was placed in a sterile sleeve.The right neck was then imaged with ultrasound.   Jugular vein was identified it is echolucent and homogeneous indicating patency. 1% lidocaine is then infiltrated under ultrasound visualization and subsequently a Seldinger needle is inserted under real-time ultrasound guidance.  J-wire is then advanced into the inferior vena cava under fluoroscopic guidance. With the tip of the sheath at the confluence of the iliac veins inferior vena  caval imaging is performed.  After review of the image the sheath is repositioned to above the filter and the snares introduced. Snares opened and the hook is secured without difficulty. The filter is then collapsed within the sheath and removed without difficulty.  Sheath is removed by pressures held the patient tolerated the procedure well and there were no immediate complications.  Interpretation: inferior vena cava is widely patent filter is in place in good position. Filter is removed without incident.     COMPLICATIONS: None  CONDITION: Dana Bautista, M.D. [facility] Office: [phone]   10/24/2023, 9:10 AM

## 2023-10-24 NOTE — Interval H&P Note (Signed)
History and Physical Interval Note:  10/24/2023 8:29 AM  Dana Bautista  has presented today for surgery, with the diagnosis of IVC filter removal   DVT.  The various methods of treatment have been discussed with the patient and family. After consideration of risks, benefits and other options for treatment, the patient has consented to  Procedure(s): IVC FILTER REMOVAL (N/A) as a surgical intervention.  The patient's history has been reviewed, patient examined, no change in status, stable for surgery.  I have reviewed the patient's chart and labs.  Questions were answered to the patient's satisfaction.     [name]

## 2023-10-24 NOTE — H&P (View-Only) (Signed)
MRN : [id number]  Dana Bautista is a 78 y.o. ([birth year]) female who presents with chief complaint of legs hurt and swell.  History of Present Illness:   Patient presents to [facility] for IVC filter removal.  The patient has a history of DVT/ PE and therefore was at high risk for recurrence in the perioperative timeframe after undergoing a total joint replacement.    Right TKA using all-cemented Zimmer Persona system  surgery was on 07/27/2023.  The patient has been doing well with rehab and their mobility is increasing.  The patient notes the affected leg is not increasingly swollen or painful.  No signs and symptoms consistent with recurrence of DVT in the lower extremity.  Patient does note that as a baseline the affected extremity tends to swell with prolonged dependence, symptoms are much better with elevation.  The patient notes minimal edema in the morning which steadily worsens throughout the day.  The patient has not been using compression therapy at this point.  The patient is Eliquis 2.5 mg p.o. twice daily anticoagulation.  No SOB or pleuritic chest pains.  No cough or hemoptysis.  No blood per rectum or blood in any sputum.  No excessive bruising per the patient.   No recent shortening of the patient's walking distance or new symptoms consistent with claudication.  No history of rest pain symptoms. No new ulcers or wounds of the lower extremities have occurred.  The patient denies amaurosis fugax or recent TIA symptoms. There are no recent neurological changes noted. No recent episodes of angina or shortness of breath documented.   Current Meds  Medication Sig   acetaminophen (TYLENOL) 500 MG tablet Take 500-1,000 mg by mouth every 6 (six) hours as needed for mild pain or moderate pain.   alendronate (FOSAMAX) 70 MG/75ML solution Take 70 mg by mouth every 7 (seven) days. Take with a full glass of water on an empty stomach.   Cholecalciferol  (D-3-5) 125 MCG (5000 UT) capsule Take 5,000 Units by mouth daily.   cyanocobalamin (,VITAMIN B-12,) 1000 MCG/ML injection Inject 1,000 mcg into the muscle every 30 (thirty) days.   methocarbamol (ROBAXIN) 500 MG tablet Take 500 mg by mouth 2 (two) times daily.   Multiple Vitamins-Minerals (MULTIVITAMIN WITH MINERALS) tablet Take 2 tablets by mouth daily. Gummy   tolterodine (DETROL LA) 4 MG 24 hr capsule Take 1 capsule (4 mg total) by mouth daily.    Past Medical History:  Diagnosis Date   Anemia    Arthritis    osteoarthriist   Cancer (HCC)    skin   Carotid stenosis, asymptomatic, bilateral    Cellulitis    Complication of anesthesia    nausea   DVT (deep venous thrombosis) (HCC) 09/2019   Heart murmur    History of methicillin resistant staphylococcus aureus (MRSA) 2020   Mitral valve prolapse    Mitral valve regurgitation    Pericardial effusion    Pleural effusion    PONV (postoperative nausea and vomiting)    Pre-diabetes    Primary osteoarthritis of right knee    Pulmonary embolism (HCC) 2020   Sleep apnea    uses cpap   Tendonitis    outer aspect of right foot   Thyroid nodule    UTI (lower urinary tract infection)    Vitamin B 12 deficiency     Past Surgical History:  Procedure Laterality Date   ABDOMINAL HYSTERECTOMY  APPENDECTOMY     COLONOSCOPY     FRACTURE SURGERY     left wrist   FRACTURE SURGERY Left    wrist   INCISION AND DRAINAGE Right 06/03/2021   Procedure: INCISION AND DRAINAGE-Right Index Finger;  Surgeon: Christena Flake, MD;  Location: [facility];  Service: Orthopedics;  Laterality: Right;   IVC FILTER INSERTION N/A 03/15/2022   Procedure: IVC FILTER INSERTION;  Surgeon: Renford Dills, MD;  Location: [facility];  Service: Cardiovascular;  Laterality: N/A;   IVC FILTER INSERTION N/A 07/18/2023   Procedure: IVC FILTER INSERTION;  Surgeon: Renford Dills, MD;  Location: [facility];  Service: Cardiovascular;   Laterality: N/A;   IVC FILTER REMOVAL N/A 05/31/2022   Procedure: IVC FILTER REMOVAL;  Surgeon: Renford Dills, MD;  Location: [facility];  Service: Cardiovascular;  Laterality: N/A;   KNEE SURGERY Left    debridement   TONSILLECTOMY     TOTAL HIP ARTHROPLASTY Right 03/22/2022   Procedure: TOTAL HIP ARTHROPLASTY;  Surgeon: Christena Flake, MD;  Location: [facility];  Service: Orthopedics;  Laterality: Right;   TOTAL KNEE ARTHROPLASTY Right 07/27/2023   Procedure: TOTAL KNEE ARTHROPLASTY;  Surgeon: Christena Flake, MD;  Location: [facility];  Service: Orthopedics;  Laterality: Right;    Social History Social History   Tobacco Use   Smoking status: Never    Passive exposure: Never   Smokeless tobacco: Never  Vaping Use   Vaping status: Never Used  Substance Use Topics   Alcohol use: Yes    Comment: occasionally   Drug use: No    Family History Family History  Problem Relation Age of Onset   Breast cancer Mother    Asthma Mother    Lung cancer Father    Heart disease Brother    Heart disease Maternal Grandfather    Throat cancer Maternal Grandfather     Allergies  Allergen Reactions   Codeine Nausea And Vomiting   Elemental Sulfur Other (See Comments)    Lip swelling, difficulty breathing   Gabapentin Nausea And Vomiting   Levaquin [Levofloxacin] Hives   Penicillins Hives and Other (See Comments)    Did it involve swelling of the face/tongue/throat, SOB, or low BP? Unknown Did it involve sudden or severe rash/hives, skin peeling, or any reaction on the inside of your mouth or nose? Yes Did you need to seek medical attention at a hospital or doctor's office? Yes When did it last happen? many years If all above answers are "NO", may proceed with cephalosporin use.    Sulfa Antibiotics Other (See Comments)    Other reaction(s): lip swelling   Latex Rash    Peeling skin   Sulfur Other (See Comments) and Swelling    Lip swelling    Lip swelling, difficulty  breathing     REVIEW OF SYSTEMS (Negative unless checked)  Constitutional: [] Weight loss  [] Fever  [] Chills Cardiac: [] Chest pain   [] Chest pressure   [] Palpitations   [] Shortness of breath when laying flat   [] Shortness of breath with exertion. Vascular:  [] Pain in legs with walking   [x] Pain in legs at rest  [] History of DVT   [] Phlebitis   [x] Swelling in legs   [] Varicose veins   [] Non-healing ulcers Pulmonary:   [] Uses home oxygen   [] Productive cough   [] Hemoptysis   [] Wheeze  [] COPD   [] Asthma Neurologic:  [] Dizziness   [] Seizures   [] History of stroke   [] History  of TIA  [] Aphasia   [] Vissual changes   [] Weakness or numbness in arm   [] Weakness or numbness in leg Musculoskeletal:   [] Joint swelling   [] Joint pain   [] Low back pain Hematologic:  [] Easy bruising  [] Easy bleeding   [] Hypercoagulable state   [] Anemic Gastrointestinal:  [] Diarrhea   [] Vomiting  [] Gastroesophageal reflux/heartburn   [] Difficulty swallowing. Genitourinary:  [] Chronic kidney disease   [] Difficult urination  [] Frequent urination   [] Blood in urine Skin:  [] Rashes   [] Ulcers  Psychological:  [] History of anxiety   []  History of major depression.  Physical Examination  Vitals:   10/24/23 0746  BP: (!) 112/53  Pulse: 77  Resp: 18  Temp: 97.7 F (36.5 C)  TempSrc: Oral  SpO2: 98%  Weight: 78.7 kg  Height: 5\' 6"  (1.676 m)   Body mass index is 28 kg/m. Gen: WD/WN, NAD Head: [place]/AT, No temporalis wasting.  Ear/Nose/Throat: Hearing grossly intact, nares w/o erythema or drainage, pinna without lesions Eyes: PER, EOMI, sclera nonicteric.  Neck: Supple, no gross masses.  No JVD.  Pulmonary:  Good air movement, no audible wheezing, no use of accessory muscles.  Cardiac: RRR, precordium not hyperdynamic. Vascular:  scattered varicosities present bilaterally.  Moderate venous stasis changes to the legs bilaterally.  2+ soft pitting edema. CEAP C4sEpAsPr   Vessel Right Left  Radial Palpable Palpable   Gastrointestinal: soft, non-distended. No guarding/no peritoneal signs.  Musculoskeletal: M/S 5/5 throughout.  No deformity.  Neurologic: CN 2-12 intact. Pain and light touch intact in extremities.  Symmetrical.  Speech is fluent. Motor exam as listed above. Psychiatric: Judgment intact, Mood & affect appropriate for pt's clinical situation. Dermatologic: Venous rashes no ulcers noted.  No changes consistent with cellulitis. Lymph : No lichenification or skin changes of chronic lymphedema.  CBC Lab Results  Component Value Date   WBC 4.7 10/11/2023   HGB 10.7 (L) 10/11/2023   HCT 33.9 (L) 10/11/2023   MCV 90.6 10/11/2023   PLT 266 10/11/2023    BMET    Component Value Date/Time   NA 138 10/11/2023 0907   K 4.2 10/11/2023 0907   CL 105 10/11/2023 0907   CO2 25 10/11/2023 0907   GLUCOSE 101 (H) 10/11/2023 0907   BUN 18 10/11/2023 0907   CREATININE 0.53 10/11/2023 0907   CALCIUM 9.0 10/11/2023 0907   GFRNONAA >60 10/11/2023 0907   GFRAA >60 05/01/2020 1102   Estimated Creatinine Clearance: 64.4 mL/min (by C-G formula based on SCr of 0.53 mg/dL).  COAG Lab Results  Component Value Date   INR 1.1 10/05/2019   INR 1.0 10/04/2019    Radiology No results found.   Assessment/Plan 1.  Hypercoagulable state with history of DVT:  The patient has done well status post joint replacement surgery.  Therefore, I recommend that we remove the IVC filter.  Risk and benefits were reviewed all questions answered patient has agreed to proceed.  Patient will continue to elevate to minimize swelling.  Given the history of DVT and the possibility of post phlebitic changes such as swelling and discomfort the patient should wear graduated compression stockings.  The compression should be worn on a daily basis.  In addition, behavioral modification including elevation during the day and avoidance of prolonged dependency is helpful.    The patient will follow-up with me after the filter  removal.     2.  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   IVC filter will be placed prior to the knee replacement surgery.  Plans will be for removal approximately 30 days postop.   Continued activity and therapy was stressed.   3.  Gastroesophageal GL reflux disease: Continue PPI as already ordered, this medication has been reviewed and there are no changes at this time.   Avoidence of caffeine and alcohol   Moderate elevation of the head of the bed    [name], MD  10/24/2023 8:25 AM

## 2023-10-24 NOTE — Progress Notes (Signed)
MRN : [id number]  Dana Bautista is a 78 y.o. ([birth year]) female who presents with chief complaint of legs hurt and swell.  History of Present Illness:   Patient presents to [facility] for IVC filter removal.  The patient has a history of DVT/ PE and therefore was at high risk for recurrence in the perioperative timeframe after undergoing a total joint replacement.    Right TKA using all-cemented Zimmer Person[employer] system  surgery was on 07/27/2023.  The patient has been doing well with rehab and their mobility is increasing.  The patient notes the affected leg is not increasingly swollen or painful.  No signs and symptoms consistent with recurrence of DVT in the lower extremity.  Patient does note that as a baseline the affected extremity tends to swell with prolonged dependence, symptoms are much better with elevation.  The patient notes minimal edema in the morning which steadily worsens throughout the day.  The patient has not been using compression therapy at this point.  The patient is Eliquis 2.5 mg p.o. twice daily anticoagulation.  No SOB or pleuritic chest pains.  No cough or hemoptysis.  No blood per rectum or blood in any sputum.  No excessive bruising per the patient.   No recent shortening of the patient's walking distance or new symptoms consistent with claudication.  No history of rest pain symptoms. No new ulcers or wounds of the lower extremities have occurred.  The patient denies amaurosis fugax or recent TIA symptoms. There are no recent neurological changes noted. No recent episodes of angina or shortness of breath documented.   Current Meds  Medication Sig   acetaminophen (TYLENOL) 500 MG tablet Take 500-1,000 mg by mouth every 6 (six) hours as needed for mild pain or moderate pain.   alendronate (FOSAMAX) 70 MG/75ML solution Take 70 mg by mouth every 7 (seven) days. Take with a full glass of water on an empty stomach.   Cholecalciferol  (D-3-5) 125 MCG (5000 UT) capsule Take 5,000 Units by mouth daily.   cyanocobalamin (,VITAMIN B-12,) 1000 MCG/ML injection Inject 1,000 mcg into the muscle every 30 (thirty) days.   methocarbamol (ROBAXIN) 500 MG tablet Take 500 mg by mouth 2 (two) times daily.   Multiple Vitamins-Minerals (MULTIVITAMIN WITH MINERALS) tablet Take 2 tablets by mouth daily. Gummy   tolterodine (DETROL LA) 4 MG 24 hr capsule Take 1 capsule (4 mg total) by mouth daily.    Past Medical History:  Diagnosis Date   Anemia    Arthritis    osteoarthriist   Cancer (HCC)    skin   Carotid stenosis, asymptomatic, bilateral    Cellulitis    Complication of anesthesia    nausea   DVT (deep venous thrombosis) (HCC) 09/2019   Heart murmur    History of methicillin resistant staphylococcus aureus (MRSA) 2020   Mitral valve prolapse    Mitral valve regurgitation    Pericardial effusion    Pleural effusion    PONV (postoperative nausea and vomiting)    Pre-diabetes    Primary osteoarthritis of right knee    Pulmonary embolism (HCC) 2020   Sleep apnea    uses cpap   Tendonitis    outer aspect of right foot   Thyroid nodule    UTI (lower urinary tract infection)    Vitamin B 12 deficiency     Past Surgical History:  Procedure Laterality Date   ABDOMINAL HYSTERECTOMY  APPENDECTOMY     COLONOSCOPY     FRACTURE SURGERY     left wrist   FRACTURE SURGERY Left    wrist   INCISION AND DRAINAGE Right 06/03/2021   Procedure: INCISION AND DRAINAGE-Right Index Finger;  Surgeon: Christena Flake, MD;  Location: [facility];  Service: Orthopedics;  Laterality: Right;   IVC FILTER INSERTION N/A 03/15/2022   Procedure: IVC FILTER INSERTION;  Surgeon: Renford Dills, MD;  Location: [facility];  Service: Cardiovascular;  Laterality: N/A;   IVC FILTER INSERTION N/A 07/18/2023   Procedure: IVC FILTER INSERTION;  Surgeon: Renford Dills, MD;  Location: [facility];  Service: Cardiovascular;   Laterality: N/A;   IVC FILTER REMOVAL N/A 05/31/2022   Procedure: IVC FILTER REMOVAL;  Surgeon: Renford Dills, MD;  Location: [facility];  Service: Cardiovascular;  Laterality: N/A;   KNEE SURGERY Left    debridement   TONSILLECTOMY     TOTAL HIP ARTHROPLASTY Right 03/22/2022   Procedure: TOTAL HIP ARTHROPLASTY;  Surgeon: Christena Flake, MD;  Location: [facility];  Service: Orthopedics;  Laterality: Right;   TOTAL KNEE ARTHROPLASTY Right 07/27/2023   Procedure: TOTAL KNEE ARTHROPLASTY;  Surgeon: Christena Flake, MD;  Location: [facility];  Service: Orthopedics;  Laterality: Right;    Social History Social History   Tobacco Use   Smoking status: Never    Passive exposure: Never   Smokeless tobacco: Never  Vaping Use   Vaping status: Never Used  Substance Use Topics   Alcohol use: Yes    Comment: occasionally   Drug use: No    Family History Family History  Problem Relation Age of Onset   Breast cancer Mother    Asthma Mother    Lung cancer Father    Heart disease Brother    Heart disease Maternal Grandfather    Throat cancer Maternal Grandfather     Allergies  Allergen Reactions   Codeine Nausea And Vomiting   Elemental Sulfur Other (See Comments)    Lip swelling, difficulty breathing   Gabapentin Nausea And Vomiting   Levaquin [Levofloxacin] Hives   Penicillins Hives and Other (See Comments)    Did it involve swelling of the face/tongue/throat, SOB, or low BP? Unknown Did it involve sudden or severe rash/hives, skin peeling, or any reaction on the inside of your mouth or nose? Yes Did you need to seek medical attention at a hospital or doctor's office? Yes When did it last happen? many years If all above answers are "NO", may proceed with cephalosporin use.    Sulfa Antibiotics Other (See Comments)    Other reaction(s): lip swelling   Latex Rash    Peeling skin   Sulfur Other (See Comments) and Swelling    Lip swelling    Lip swelling, difficulty  breathing     REVIEW OF SYSTEMS (Negative unless checked)  Constitutional: [] Weight loss  [] Fever  [] Chills Cardiac: [] Chest pain   [] Chest pressure   [] Palpitations   [] Shortness of breath when laying flat   [] Shortness of breath with exertion. Vascular:  [] Pain in legs with walking   [x] Pain in legs at rest  [] History of DVT   [] Phlebitis   [x] Swelling in legs   [] Varicose veins   [] Non-healing ulcers Pulmonary:   [] Uses home oxygen   [] Productive cough   [] Hemoptysis   [] Wheeze  [] COPD   [] Asthma Neurologic:  [] Dizziness   [] Seizures   [] History of stroke   [] History  of TIA  [] Aphasia   [] Vissual changes   [] Weakness or numbness in arm   [] Weakness or numbness in leg Musculoskeletal:   [] Joint swelling   [] Joint pain   [] Low back pain Hematologic:  [] Easy bruising  [] Easy bleeding   [] Hypercoagulable state   [] Anemic Gastrointestinal:  [] Diarrhea   [] Vomiting  [] Gastroesophageal reflux/heartburn   [] Difficulty swallowing. Genitourinary:  [] Chronic kidney disease   [] Difficult urination  [] Frequent urination   [] Blood in urine Skin:  [] Rashes   [] Ulcers  Psychological:  [] History of anxiety   []  History of major depression.  Physical Examination  Vitals:   10/24/23 0746  BP: (!) 112/53  Pulse: 77  Resp: 18  Temp: 97.7 F (36.5 C)  TempSrc: Oral  SpO2: 98%  Weight: 78.7 kg  Height: 5\' 6"  (1.676 m)   Body mass index is 28 kg/m. Gen: WD/WN, NAD Head: [place]/AT, No temporalis wasting.  Ear/Nose/Throat: Hearing grossly intact, nares w/o erythema or drainage, pinna without lesions Eyes: PER, EOMI, sclera nonicteric.  Neck: Supple, no gross masses.  No JVD.  Pulmonary:  Good air movement, no audible wheezing, no use of accessory muscles.  Cardiac: RRR, precordium not hyperdynamic. Vascular:  scattered varicosities present bilaterally.  Moderate venous stasis changes to the legs bilaterally.  2+ soft pitting edema. CEAP C4sEpAsPr   Vessel Right Left  Radial Palpable Palpable   Gastrointestinal: soft, non-distended. No guarding/no peritoneal signs.  Musculoskeletal: M/S 5/5 throughout.  No deformity.  Neurologic: CN 2-12 intact. Pain and light touch intact in extremities.  Symmetrical.  Speech is fluent. Motor exam as listed above. Psychiatric: Judgment intact, Mood & affect appropriate for pt's clinical situation. Dermatologic: Venous rashes no ulcers noted.  No changes consistent with cellulitis. Lymph : No lichenification or skin changes of chronic lymphedema.  CBC Lab Results  Component Value Date   WBC 4.7 10/11/2023   HGB 10.7 (L) 10/11/2023   HCT 33.9 (L) 10/11/2023   MCV 90.6 10/11/2023   PLT 266 10/11/2023    BMET    Component Value Date/Time   NA 138 10/11/2023 0907   K 4.2 10/11/2023 0907   CL 105 10/11/2023 0907   CO2 25 10/11/2023 0907   GLUCOSE 101 (H) 10/11/2023 0907   BUN 18 10/11/2023 0907   CREATININE 0.53 10/11/2023 0907   CALCIUM 9.0 10/11/2023 0907   GFRNONAA >60 10/11/2023 0907   GFRAA >60 05/01/2020 1102   Estimated Creatinine Clearance: 64.4 mL/min (by C-G formula based on SCr of 0.53 mg/dL).  COAG Lab Results  Component Value Date   INR 1.1 10/05/2019   INR 1.0 10/04/2019    Radiology No results found.   Assessment/Plan 1.  Hypercoagulable state with history of DVT:  The patient has done well status post joint replacement surgery.  Therefore, I recommend that we remove the IVC filter.  Risk and benefits were reviewed all questions answered patient has agreed to proceed.  Patient will continue to elevate to minimize swelling.  Given the history of DVT and the possibility of post phlebitic changes such as swelling and discomfort the patient should wear graduated compression stockings.  The compression should be worn on a daily basis.  In addition, behavioral modification including elevation during the day and avoidance of prolonged dependency is helpful.    The patient will follow-up with me after the filter  removal.     2.  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   IVC filter will be placed prior to the knee replacement surgery.  Plans will be for removal approximately 30 days postop.   Continued activity and therapy was stressed.   3.  Gastroesophageal GL reflux disease: Continue PPI as already ordered, this medication has been reviewed and there are no changes at this time.   Avoidence of caffeine and alcohol   Moderate elevation of the head of the bed    [name], MD  10/24/2023 8:25 AM

## 2023-10-24 NOTE — Discharge Instructions (Signed)
Inferior Vena Cava Filter Removal, Care After ?This sheet gives you information about how to care for yourself after your procedure. Your health care provider may also give you more specific instructions. If you have problems or questions, contact your health care provider. ?What can I expect after the procedure? ?After your procedure, it is common to have: ?Mild pain in the area where the filter was removed. ?Mild bruising in the area where the filter was removed. ? ?Follow these instructions at home: ?Removal  site care ?Follow instructions from your health care provider about how to take care of the site where a catheter was removed at your neck.. Make sure you: ?Wash your hands with soap and water before you change your bandage (dressing). If soap and water are not available, use hand sanitizer. ?Leave bandage on for 48hrs.Marland Kitchen ?Check your removal site every day for signs of infection. Check for: ?More redness, swelling, or pain. ?More fluid or blood. ?Warmth. ?Pus or a bad smell. ?Keep the removal site clean and dry. ?You may shower tomorrow, do not scrub or rub over the dressing site. ?General instructions ?Take over-the-counter and prescription medicines only as told by your health care provider. ?Avoid heavy lifting or hard activities for 48 hours after the procedure or as told by your health care provider. ?Do not drive for 24 hours if you were given a a medicine to help you relax (sedative). ?Do not drive or use heavy machinery while taking prescription pain medicine. ?Do not go back to school or work until your health care provider approves. ?Keep all follow-up visits as told by your health care provider. This is important. ?Contact a health care provider if: ?You have more redness, swelling, or pain around your removal site. ?You have more fluid or blood coming from you removalr site. ?Your removal site feels warm to the touch. ?You have pus or a bad smell coming from your  removal site. ?You have a  fever. ?You are dizzy. ?You have nausea and vomiting. ?You develop a rash. ?Get help right away if: ?You develop chest pain, a cough, or difficulty breathing. ?You develop shortness of breath, feel faint, or pass out. ?You cough up blood. ?You have severe pain in your abdomen. ?You develop swelling and discoloration or pain in your legs. ?Your legs become pale and cold or blue. ?You develop weakness, difficulty moving your arms or legs, or balance problems. ?You develop problems with speech or vision. ?These symptoms may represent a serious problem that is an emergency. Do not wait to see if the symptoms will go away. Get medical help right away. Call your local emergency services (911 in the [place] Do not drive yourself to the hospital. ?Summary   ?

## 2023-11-14 ENCOUNTER — Telehealth: Payer: Self-pay

## 2023-11-14 NOTE — Telephone Encounter (Signed)
-----   Message from Rickard Patience sent at 11/14/2023  8:32 AM EST ----- Please arrange patient to follow up- MD only .  Blood count is still low

## 2023-11-14 NOTE — Telephone Encounter (Signed)
 Dana Bautista will you please schedule patient to follow-up with Dr. Cathie Hoops (MD only) and notify patient of date and time.

## 2023-11-22 NOTE — Progress Notes (Signed)
 MRN : [id number]  Dana Bautista is a 78 y.o. ([birth year]) female who presents with chief complaint of legs hurt and swell.  History of Present Illness:   The patient presents to the office for follow up s/p IVC filter removal on 10/24/2023.  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  Joint replacement was 07/27/2023.  IVC filter was placed approximately one week prior to joint surgery.     The patient notes their leg continues to be a little painful with prolonged dependency and the patient notes the leg does still swell some.  Symptoms are better with elevation.  The patient notes minimal edema in the morning which steadily worsens throughout the day.     The patient has not been using compression therapy at this point.   No SOB or pleuritic chest pains.  No cough or hemoptysis.   No blood per rectum or blood in any sputum.  No excessive bruising per the patient.   No recent shortening of the patient's walking distance or new symptoms consistent with claudication.  No history of rest pain symptoms. No new ulcers or wounds of the lower extremities have occurred.  No outpatient medications have been marked as taking for the 11/23/23 encounter (Appointment) with Gilda Crease, Latina Craver, MD.    Past Medical History:  Diagnosis Date   Anemia    Arthritis    osteoarthriist   Cancer (HCC)    skin   Carotid stenosis, asymptomatic, bilateral    Cellulitis    Complication of anesthesia    nausea   DVT (deep venous thrombosis) (HCC) 09/2019   Heart murmur    History of methicillin resistant staphylococcus aureus (MRSA) 2020   Mitral valve prolapse    Mitral valve regurgitation    Pericardial effusion    Pleural effusion    PONV (postoperative nausea and vomiting)    Pre-diabetes    Primary osteoarthritis of right knee    Pulmonary embolism (HCC) 2020   Sleep apnea    uses cpap   Tendonitis    outer aspect of right foot   Thyroid nodule    UTI (lower urinary tract  infection)    Vitamin B 12 deficiency     Past Surgical History:  Procedure Laterality Date   ABDOMINAL HYSTERECTOMY     APPENDECTOMY     COLONOSCOPY     FRACTURE SURGERY     left wrist   FRACTURE SURGERY Left    wrist   INCISION AND DRAINAGE Right 06/03/2021   Procedure: INCISION AND DRAINAGE-Right Index Finger;  Surgeon: Christena Flake, MD;  Location: [facility];  Service: Orthopedics;  Laterality: Right;   IVC FILTER INSERTION N/A 03/15/2022   Procedure: IVC FILTER INSERTION;  Surgeon: Renford Dills, MD;  Location: [facility];  Service: Cardiovascular;  Laterality: N/A;   IVC FILTER INSERTION N/A 07/18/2023   Procedure: IVC FILTER INSERTION;  Surgeon: Renford Dills, MD;  Location: [facility];  Service: Cardiovascular;  Laterality: N/A;   IVC FILTER REMOVAL N/A 05/31/2022   Procedure: IVC FILTER REMOVAL;  Surgeon: Renford Dills, MD;  Location: [facility];  Service: Cardiovascular;  Laterality: N/A;   IVC FILTER REMOVAL N/A 10/24/2023   Procedure: IVC FILTER REMOVAL;  Surgeon: Renford Dills, MD;  Location: [facility];  Service: Cardiovascular;  Laterality: N/A;   KNEE SURGERY Left    debridement   TONSILLECTOMY  TOTAL HIP ARTHROPLASTY Right 03/22/2022   Procedure: TOTAL HIP ARTHROPLASTY;  Surgeon: Christena Flake, MD;  Location: [facility];  Service: Orthopedics;  Laterality: Right;   TOTAL KNEE ARTHROPLASTY Right 07/27/2023   Procedure: TOTAL KNEE ARTHROPLASTY;  Surgeon: Christena Flake, MD;  Location: [facility];  Service: Orthopedics;  Laterality: Right;    Social History Social History   Tobacco Use   Smoking status: Never    Passive exposure: Never   Smokeless tobacco: Never  Vaping Use   Vaping status: Never Used  Substance Use Topics   Alcohol use: Yes    Comment: occasionally   Drug use: No    Family History Family History  Problem Relation Age of Onset   Breast cancer Mother    Asthma Mother    Lung cancer  Father    Heart disease Brother    Heart disease Maternal Grandfather    Throat cancer Maternal Grandfather     Allergies  Allergen Reactions   Codeine Nausea And Vomiting   Elemental Sulfur Other (See Comments)    Lip swelling, difficulty breathing   Gabapentin Nausea And Vomiting   Levaquin [Levofloxacin] Hives   Penicillins Hives and Other (See Comments)    Did it involve swelling of the face/tongue/throat, SOB, or low BP? Unknown Did it involve sudden or severe rash/hives, skin peeling, or any reaction on the inside of your mouth or nose? Yes Did you need to seek medical attention at a hospital or doctor's office? Yes When did it last happen? many years If all above answers are "NO", may proceed with cephalosporin use.    Sulfa Antibiotics Other (See Comments)    Other reaction(s): lip swelling   Latex Rash    Peeling skin   Sulfur Other (See Comments) and Swelling    Lip swelling    Lip swelling, difficulty breathing     REVIEW OF SYSTEMS (Negative unless checked)  Constitutional: [] Weight loss  [] Fever  [] Chills Cardiac: [] Chest pain   [] Chest pressure   [] Palpitations   [] Shortness of breath when laying flat   [] Shortness of breath with exertion. Vascular:  [] Pain in legs with walking   [x] Pain in legs at rest  [] History of DVT   [] Phlebitis   [x] Swelling in legs   [] Varicose veins   [] Non-healing ulcers Pulmonary:   [] Uses home oxygen   [] Productive cough   [] Hemoptysis   [] Wheeze  [] COPD   [] Asthma Neurologic:  [] Dizziness   [] Seizures   [] History of stroke   [] History of TIA  [] Aphasia   [] Vissual changes   [] Weakness or numbness in arm   [] Weakness or numbness in leg Musculoskeletal:   [] Joint swelling   [] Joint pain   [] Low back pain Hematologic:  [] Easy bruising  [] Easy bleeding   [] Hypercoagulable state   [] Anemic Gastrointestinal:  [] Diarrhea   [] Vomiting  [] Gastroesophageal reflux/heartburn   [] Difficulty swallowing. Genitourinary:  [] Chronic kidney disease    [] Difficult urination  [] Frequent urination   [] Blood in urine Skin:  [] Rashes   [] Ulcers  Psychological:  [] History of anxiety   []  History of major depression.  Physical Examination  There were no vitals filed for this visit. There is no height or weight on file to calculate BMI. Gen: WD/WN, NAD Head: [place]/AT, No temporalis wasting.  Ear/Nose/Throat: Hearing grossly intact, nares w/o erythema or drainage, pinna without lesions Eyes: PER, EOMI, sclera nonicteric.  Neck: Supple, no gross masses.  No JVD.  Pulmonary:  Good air movement, no audible wheezing, no  use of accessory muscles.  Cardiac: RRR, precordium not hyperdynamic. Vascular:  scattered varicosities present bilaterally.  Moderate venous stasis changes to the legs bilaterally.  2+ soft pitting edema. CEAP C4sEpAsPr   Vessel Right Left  Radial Palpable Palpable  Gastrointestinal: soft, non-distended. No guarding/no peritoneal signs.  Musculoskeletal: M/S 5/5 throughout.  No deformity.  Neurologic: CN 2-12 intact. Pain and light touch intact in extremities.  Symmetrical.  Speech is fluent. Motor exam as listed above. Psychiatric: Judgment intact, Mood & affect appropriate for pt's clinical situation. Dermatologic: Venous rashes no ulcers noted.  No changes consistent with cellulitis. Lymph : No lichenification or skin changes of chronic lymphedema.  CBC Lab Results  Component Value Date   WBC 4.7 10/11/2023   HGB 10.7 (L) 10/11/2023   HCT 33.9 (L) 10/11/2023   MCV 90.6 10/11/2023   PLT 266 10/11/2023    BMET    Component Value Date/Time   NA 138 10/11/2023 0907   K 4.2 10/11/2023 0907   CL 105 10/11/2023 0907   CO2 25 10/11/2023 0907   GLUCOSE 101 (H) 10/11/2023 0907   BUN 18 10/11/2023 0907   CREATININE 0.53 10/11/2023 0907   CALCIUM 9.0 10/11/2023 0907   GFRNONAA >60 10/11/2023 0907   GFRAA >60 05/01/2020 1102   CrCl cannot be calculated (Patient's most recent lab result is older than the maximum 21 days  allowed.).  COAG Lab Results  Component Value Date   INR 1.1 10/05/2019   INR 1.0 10/04/2019    Radiology PERIPHERAL VASCULAR CATHETERIZATION Result Date: 10/24/2023 See surgical note for result.    Assessment/Plan 1. History of pulmonary embolism (Primary) Recommend:  No surgery or intervention at this point in time.   The Patient is CEAP C4sEpAsPr.  The patient has been wearing compression for more than 12 weeks with no or little benefit.  The patient has been exercising daily for more than 12 weeks. The patient has been elevating and taking OTC pain medications for more than 12 weeks.  None of these have have eliminated the pain related to the lymphedema or the discomfort regarding excessive swelling and venous congestion.    I have reviewed my discussion with the patient regarding lymphedema and why it  causes symptoms.  Patient will continue wearing graduated compression on a daily basis. The patient should put the compression on first thing in the morning and removing them in the evening. The patient should not sleep in the compression.   In addition, behavioral modification throughout the day will be continued.  This will include frequent elevation (such as in a recliner), use of over the counter pain medications as needed and exercise such as walking.  The systemic causes for chronic edema such as liver, kidney and cardiac etiologies do not appear to have significant changed over the past year.    The patient has chronic , severe lymphedema with hyperpigmentation of the skin and has done MLD, skin care, medication, diet, exercise, elevation and compression for 4 weeks with no improvement,  I am recommending a lymphedema pump.  The patient still has stage 3 lymphedema and therefore, I believe that a lymph pump is needed to improve the control of the patient's lymphedema and improve the quality of life.  Additionally, a lymph pump is warranted because it will reduce the risk of  cellulitis and ulceration in the future.  Patient should follow-up in six months   2. Carotid stenosis, asymptomatic, bilateral Recommend:  Given the patient's asymptomatic subcritical stenosis no  further invasive testing or surgery at this time.  Continue antiplatelet therapy as prescribed Continue management of CAD, HTN and Hyperlipidemia Healthy heart diet,  encouraged exercise at least 4 times per week  Follow up in 12 months with duplex ultrasound and physical exam   3. Primary osteoarthritis of right knee Continue medications to treat the patient's degenerative disease as already ordered, these medications have been reviewed and there are no changes at this time.  Continued activity and therapy was stressed. d   [name], MD  11/22/2023 8:30 PM

## 2023-11-23 ENCOUNTER — Encounter (INDEPENDENT_AMBULATORY_CARE_PROVIDER_SITE_OTHER): Payer: Self-pay | Admitting: Vascular Surgery

## 2023-11-23 ENCOUNTER — Ambulatory Visit (INDEPENDENT_AMBULATORY_CARE_PROVIDER_SITE_OTHER): Payer: Medicare Other | Admitting: Vascular Surgery

## 2023-11-23 ENCOUNTER — Encounter: Payer: Self-pay | Admitting: Oncology

## 2023-11-23 VITALS — BP 104/62 | HR 68 | Resp 16 | Wt 175.0 lb

## 2023-11-23 DIAGNOSIS — M1711 Unilateral primary osteoarthritis, right knee: Secondary | ICD-10-CM | POA: Diagnosis not present

## 2023-11-23 DIAGNOSIS — I6523 Occlusion and stenosis of bilateral carotid arteries: Secondary | ICD-10-CM

## 2023-11-23 DIAGNOSIS — Z86711 Personal history of pulmonary embolism: Secondary | ICD-10-CM | POA: Diagnosis not present

## 2023-11-24 ENCOUNTER — Encounter: Payer: Self-pay | Admitting: Oncology

## 2023-11-26 ENCOUNTER — Encounter (INDEPENDENT_AMBULATORY_CARE_PROVIDER_SITE_OTHER): Payer: Self-pay | Admitting: Vascular Surgery

## 2024-02-01 ENCOUNTER — Encounter: Payer: Self-pay | Admitting: Oncology

## 2024-02-02 ENCOUNTER — Encounter: Payer: Self-pay | Admitting: Oncology

## 2024-02-09 ENCOUNTER — Other Ambulatory Visit: Payer: Self-pay

## 2024-02-09 DIAGNOSIS — Q6211 Congenital occlusion of ureteropelvic junction: Secondary | ICD-10-CM

## 2024-02-10 LAB — BASIC METABOLIC PANEL WITH GFR
BUN/Creatinine Ratio: 21 (ref 12–28)
BUN: 15 mg/dL (ref 8–27)
CO2: 20 mmol/L (ref 20–29)
Calcium: 9.5 mg/dL (ref 8.7–10.3)
Chloride: 103 mmol/L (ref 96–106)
Creatinine, Ser: 0.73 mg/dL (ref 0.57–1.00)
Glucose: 87 mg/dL (ref 70–99)
Potassium: 4.7 mmol/L (ref 3.5–5.2)
Sodium: 139 mmol/L (ref 134–144)
eGFR: 86 mL/min/{1.73_m2} (ref >59)

## 2024-02-13 ENCOUNTER — Encounter (INDEPENDENT_AMBULATORY_CARE_PROVIDER_SITE_OTHER): Payer: Self-pay

## 2024-02-13 ENCOUNTER — Ambulatory Visit
Admission: RE | Admit: 2024-02-13 | Discharge: 2024-02-13 | Disposition: A | Discharge status: Home / Self Care | Source: Ambulatory Visit | Attending: Urology | Admitting: Urology

## 2024-02-13 DIAGNOSIS — Q6211 Congenital occlusion of ureteropelvic junction: Secondary | ICD-10-CM | POA: Insufficient documentation

## 2024-02-14 ENCOUNTER — Ambulatory Visit: Payer: Self-pay | Admitting: Urology

## 2024-02-15 ENCOUNTER — Telehealth: Payer: Self-pay | Admitting: Oncology

## 2024-02-15 NOTE — Telephone Encounter (Signed)
 Dana Bautista LVM back in Feb to schedule appt but pt did not call back.   Please schedule MD first then labs- next avail appt

## 2024-02-15 NOTE — Telephone Encounter (Signed)
 Patient called to inquire about return appointments. Last wrap up Lab in 4 weeks.  TBD- please advise how to schedule appointments.  Thank you

## 2024-03-04 ENCOUNTER — Encounter: Payer: Self-pay | Admitting: Oncology

## 2024-03-06 ENCOUNTER — Ambulatory Visit (INDEPENDENT_AMBULATORY_CARE_PROVIDER_SITE_OTHER): Admitting: Urology

## 2024-03-06 VITALS — BP 118/76 | HR 78 | Ht 65.0 in | Wt 182.0 lb

## 2024-03-06 DIAGNOSIS — N3281 Overactive bladder: Secondary | ICD-10-CM | POA: Diagnosis not present

## 2024-03-06 MED ORDER — GEMTESA 75 MG PO TABS: 75.0000 mg | ORAL_TABLET | Freq: Every day | ORAL | Status: DC

## 2024-03-06 NOTE — Progress Notes (Signed)
 Dana Bautista,acting as a scribe for Dustin Gimenez, MD.,have documented all relevant documentation on the behalf of Dustin Gimenez, MD,as directed by  Dustin Gimenez, MD while in the presence of Dustin Gimenez, MD.  03/06/24 1:49 PM   Dana Bautista 17-Mar-1946 [id number]  Referring provider: Delmus Ferri, PA [address] [facility] [place]  [place] [address]  Chief Complaint  Patient presents with   Hydronephrosis    HPI: 78 year old female with a history of OAB and pelvic organ prolapse previously managed by Dr. Cherylene Corrente, presents today for follow-up.   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   Her BMP indicates normal creatinine levels, and a recent renal ultrasound shows no overt hydronephrosis, with stable left pelvic ectasis observed on a CT scan from November 2024.   She reports intermittent pain that is not located in the back but rather in the abdominal area, lasting a couple of hours before resolving. She experiences nocturia, waking up multiple times at night to urinate, despite attempts to manage fluid intake. She is currently taking Detrol  LA 4 mg for her OAB, which she takes every morning. She has tried other medications in the past but discontinued them due to adverse effects.   She also reports a history of bedwetting as a child and urinary issues that resolved until after childbirth. She has undergone surgery for pelvic organ prolapse and is not interested in further surgery unless necessary.   Additionally, she has had a hip and knee replacement, with increased urinary frequency post-surgery. She is concerned about her sleep being disrupted by nocturia and is interested in exploring medication options to improve her symptoms.  PMH: Past Medical History:  Diagnosis Date   Anemia    Arthritis    osteoarthriist   Cancer (HCC)    skin   Carotid stenosis, asymptomatic, bilateral    Cellulitis     Complication of anesthesia    nausea   DVT (deep venous thrombosis) (HCC) 09/2019   Heart murmur    History of methicillin resistant staphylococcus aureus (MRSA) 2020   Mitral valve prolapse    Mitral valve regurgitation    Pericardial effusion    Pleural effusion    PONV (postoperative nausea and vomiting)    Pre-diabetes    Primary osteoarthritis of right knee    Pulmonary embolism (HCC) 2020   Sleep apnea    uses cpap   Tendonitis    outer aspect of right foot   Thyroid  nodule    UTI (lower urinary tract infection)    Vitamin B 12 deficiency     Surgical History: Past Surgical History:  Procedure Laterality Date   ABDOMINAL HYSTERECTOMY     APPENDECTOMY     COLONOSCOPY     FRACTURE SURGERY     left wrist   FRACTURE SURGERY Left    wrist   INCISION AND DRAINAGE Right 06/03/2021   Procedure: INCISION AND DRAINAGE-Right Index Finger;  Surgeon: Elner Hahn, MD;  Location: [facility];  Service: Orthopedics;  Laterality: Right;   IVC FILTER INSERTION N/A 03/15/2022   Procedure: IVC FILTER INSERTION;  Surgeon: Jackquelyn Mass, MD;  Location: [facility];  Service: Cardiovascular;  Laterality: N/A;   IVC FILTER INSERTION N/A 07/18/2023   Procedure: IVC FILTER INSERTION;  Surgeon: Jackquelyn Mass, MD;  Location: [facility];  Service: Cardiovascular;  Laterality: N/A;   IVC FILTER REMOVAL N/A  05/31/2022   Procedure: IVC FILTER REMOVAL;  Surgeon: Jackquelyn Mass, MD;  Location: [facility];  Service: Cardiovascular;  Laterality: N/A;   IVC FILTER REMOVAL N/A 10/24/2023   Procedure: IVC FILTER REMOVAL;  Surgeon: Jackquelyn Mass, MD;  Location: [facility];  Service: Cardiovascular;  Laterality: N/A;   KNEE SURGERY Left    debridement   TONSILLECTOMY     TOTAL HIP ARTHROPLASTY Right 03/22/2022   Procedure: TOTAL HIP ARTHROPLASTY;  Surgeon: Elner Hahn, MD;  Location: [facility];  Service: Orthopedics;  Laterality: Right;   TOTAL  KNEE ARTHROPLASTY Right 07/27/2023   Procedure: TOTAL KNEE ARTHROPLASTY;  Surgeon: Elner Hahn, MD;  Location: [facility];  Service: Orthopedics;  Laterality: Right;    Home Medications:  Allergies as of 03/06/2024       Reactions   Codeine Nausea And Vomiting   Elemental Sulfur Other (See Comments)   Lip swelling, difficulty breathing   Gabapentin Nausea And Vomiting   Levaquin [levofloxacin] Hives   Oxycodone  Itching, Nausea Only   Penicillins Hives, Other (See Comments)   Did it involve swelling of the face/tongue/throat, SOB, or low BP? Unknown Did it involve sudden or severe rash/hives, skin peeling, or any reaction on the inside of your mouth or nose? Yes Did you need to seek medical attention at a hospital or doctor's office? Yes When did it last happen? many years If all above answers are "NO", may proceed with cephalosporin use.   Sulfa Antibiotics Other (See Comments)   Other reaction(s): lip swelling   Latex Rash   Peeling skin   Sulfur Other (See Comments), Swelling   Lip swelling    Lip swelling, difficulty breathing        Medication List        Accurate as of March 06, 2024  1:49 PM. If you have any questions, ask your nurse or doctor.          STOP taking these medications    alendronate 70 MG/75ML solution Commonly known as: FOSAMAX   ferrous sulfate  325 (65 FE) MG tablet   tiZANidine 2 MG tablet Commonly known as: ZANAFLEX   tolterodine  4 MG 24 hr capsule Commonly known as: DETROL  LA       TAKE these medications    acetaminophen  500 MG tablet Commonly known as: TYLENOL  Take 500-1,000 mg by mouth every 6 (six) hours as needed for mild pain or moderate pain.   apixaban  2.5 MG Tabs tablet Commonly known as: Eliquis  Take 1 tablet (2.5 mg total) by mouth 2 (two) times daily. Please restart your Eliquis  tomorrow morning   cyanocobalamin  1000 MCG/ML injection Commonly known as: VITAMIN B12 Inject 1,000 mcg into the muscle every 30  (thirty) days.   D-3-5 125 MCG (5000 UT) capsule Generic drug: Cholecalciferol  Take 5,000 Units by mouth daily.   Gemtesa 75 MG Tabs Generic drug: Vibegron Take 1 tablet (75 mg total) by mouth daily.   methocarbamol 500 MG tablet Commonly known as: ROBAXIN Take 500 mg by mouth 2 (two) times daily.   multivitamin with minerals tablet Take 2 tablets by mouth daily. Gummy        Allergies:  Allergies  Allergen Reactions   Codeine Nausea And Vomiting   Elemental Sulfur Other (See Comments)    Lip swelling, difficulty breathing   Gabapentin Nausea And Vomiting   Levaquin [Levofloxacin] Hives   Oxycodone  Itching and Nausea Only   Penicillins Hives and Other (See Comments)  Did it involve swelling of the face/tongue/throat, SOB, or low BP? Unknown Did it involve sudden or severe rash/hives, skin peeling, or any reaction on the inside of your mouth or nose? Yes Did you need to seek medical attention at a hospital or doctor's office? Yes When did it last happen? many years If all above answers are "NO", may proceed with cephalosporin use.    Sulfa Antibiotics Other (See Comments)    Other reaction(s): lip swelling   Latex Rash    Peeling skin   Sulfur Other (See Comments) and Swelling    Lip swelling    Lip swelling, difficulty breathing    Family History: Family History  Problem Relation Age of Onset   Breast cancer Mother    Asthma Mother    Lung cancer Father    Heart disease Brother    Heart disease Maternal Grandfather    Throat cancer Maternal Grandfather     Social History:  reports that she has never smoked. She has never been exposed to tobacco smoke. She has never used smokeless tobacco. She reports current alcohol use. She reports that she does not use drugs.   Physical Exam: BP 118/76   Pulse 78   Ht 5' 5 (1.651 m)   Wt 182 lb (82.6 kg)   BMI 30.29 kg/m   Constitutional:  Alert and oriented, No acute distress. HEENT: [place] AT, moist mucus  membranes.  Trachea midline, no masses. Neurologic: Grossly intact, no focal deficits, moving all 4 extremities. Psychiatric: Normal mood and affect.   Pertinent Imaging: EXAM: RENAL / URINARY TRACT ULTRASOUND COMPLETE  COMPARISON:  CT 08/12/2023  FINDINGS: Right Kidney:  Renal measurements: 10.6 x 4.1 x 4.0 cm = volume: 90 mL. Echogenicity within normal limits. No mass or hydronephrosis visualized. Prominent right extrarenal pelvis.  Left Kidney:  Renal measurements: 10.6 x 6.3 x 5.2 cm = volume: 183 mL. Echogenicity within normal limits. Mild hydronephrosis. Cystic mass measuring 1.5 cm without internal vascularity compatible with benign cysts. No follow-up recommended.  Bladder:  Appears normal for degree of bladder distention. Bilateral ureteral jets were visualized.  Other:  None.  IMPRESSION: Mild left hydronephrosis grossly similar to prior CT 08/12/2023 given differences in technique. Bilateral ureteral jets were visualized during the exam.   Electronically Signed By: Rozell Cornet M.D. On: 02/21/2024 01:33  This was personally reviewed and I agree with the radiologic interpretation.   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  -Cr stable - The findings are not clinically significant, and no further follow-up is needed unless symptoms worsen.  2. OAB - She is currently on Detrol  LA 4 mg, which is the maximum dose. Despite this, she experiences nocturia and occasional daytime urgency. - Plan to add Gemtesa 75 mg to the current regimen to target different bladder receptors and assess for improvement over the next month. - Instruct her  to monitor symptoms and report back via MyChart regarding any changes or side effects, particularly if there is difficulty urinating. - Discussed alternative treatments,  including Botox and nerve treatments, if dual therapy is ineffective. - Scheduled follow-up with PA in one month to reassess symptoms and medication efficacy.  Return in about 1 month (around 04/05/2024) for PA visit for reassessment of symptoms and medication efficacy.  I have reviewed the above documentation for accuracy and completeness, and I agree with the  above.   Dustin Gimenez, MD   [facility] [address], [address] [place], [place] [address] [phone]

## 2024-03-12 ENCOUNTER — Ambulatory Visit: Admitting: Oncology

## 2024-03-12 ENCOUNTER — Other Ambulatory Visit

## 2024-03-25 ENCOUNTER — Inpatient Hospital Stay

## 2024-03-25 ENCOUNTER — Encounter: Payer: Self-pay | Admitting: Oncology

## 2024-03-25 ENCOUNTER — Ambulatory Visit: Payer: Self-pay | Admitting: Oncology

## 2024-03-25 ENCOUNTER — Inpatient Hospital Stay: Attending: Oncology | Admitting: Oncology

## 2024-03-25 VITALS — BP 95/63 | HR 64 | Temp 98.3°F | Resp 18 | Wt 186.0 lb

## 2024-03-25 DIAGNOSIS — Z86718 Personal history of other venous thrombosis and embolism: Secondary | ICD-10-CM | POA: Insufficient documentation

## 2024-03-25 DIAGNOSIS — D649 Anemia, unspecified: Secondary | ICD-10-CM

## 2024-03-25 DIAGNOSIS — Z803 Family history of malignant neoplasm of breast: Secondary | ICD-10-CM | POA: Insufficient documentation

## 2024-03-25 DIAGNOSIS — Z801 Family history of malignant neoplasm of trachea, bronchus and lung: Secondary | ICD-10-CM | POA: Diagnosis not present

## 2024-03-25 DIAGNOSIS — E538 Deficiency of other specified B group vitamins: Secondary | ICD-10-CM | POA: Diagnosis not present

## 2024-03-25 DIAGNOSIS — D509 Iron deficiency anemia, unspecified: Secondary | ICD-10-CM | POA: Diagnosis present

## 2024-03-25 DIAGNOSIS — D508 Other iron deficiency anemias: Secondary | ICD-10-CM

## 2024-03-25 DIAGNOSIS — Z800 Family history of malignant neoplasm of digestive organs: Secondary | ICD-10-CM | POA: Insufficient documentation

## 2024-03-25 DIAGNOSIS — Z86711 Personal history of pulmonary embolism: Secondary | ICD-10-CM | POA: Diagnosis not present

## 2024-03-25 DIAGNOSIS — Z7901 Long term (current) use of anticoagulants: Secondary | ICD-10-CM | POA: Diagnosis not present

## 2024-03-25 LAB — CBC WITH DIFFERENTIAL/PLATELET
Abs Immature Granulocytes: 0.01 10*3/uL (ref 0.00–0.07)
Basophils Absolute: 0 10*3/uL (ref 0.0–0.1)
Basophils Relative: 1 %
Eosinophils Absolute: 0.2 10*3/uL (ref 0.0–0.5)
Eosinophils Relative: 4 %
HCT: 35.7 % — ABNORMAL LOW (ref 36.0–46.0)
Hemoglobin: 11.5 g/dL — ABNORMAL LOW (ref 12.0–15.0)
Immature Granulocytes: 0 %
Lymphocytes Relative: 31 %
Lymphs Abs: 1.9 10*3/uL (ref 0.7–4.0)
MCH: 28.4 pg (ref 26.0–34.0)
MCHC: 32.2 g/dL (ref 30.0–36.0)
MCV: 88.1 fL (ref 80.0–100.0)
Monocytes Absolute: 0.5 10*3/uL (ref 0.1–1.0)
Monocytes Relative: 8 %
Neutro Abs: 3.5 10*3/uL (ref 1.7–7.7)
Neutrophils Relative %: 56 %
Platelets: 274 10*3/uL (ref 150–400)
RBC: 4.05 MIL/uL (ref 3.87–5.11)
RDW: 13.1 % (ref 11.5–15.5)
WBC: 6.1 10*3/uL (ref 4.0–10.5)
nRBC: 0 % (ref 0.0–0.2)

## 2024-03-25 LAB — IRON AND TIBC
Iron: 74 ug/dL (ref 28–170)
Saturation Ratios: 24 % (ref 10.4–31.8)
TIBC: 304 ug/dL (ref 250–450)
UIBC: 230 ug/dL

## 2024-03-25 LAB — LACTATE DEHYDROGENASE: LDH: 102 U/L (ref 98–192)

## 2024-03-25 LAB — HEPATIC FUNCTION PANEL
ALT: 12 U/L (ref 0–44)
AST: 15 U/L (ref 15–41)
Albumin: 4.1 g/dL (ref 3.5–5.0)
Alkaline Phosphatase: 53 U/L (ref 38–126)
Bilirubin, Direct: <0.1 mg/dL (ref 0.0–0.2)
Total Bilirubin: 0.9 mg/dL (ref 0.0–1.2)
Total Protein: 6.7 g/dL (ref 6.5–8.1)

## 2024-03-25 LAB — RETIC PANEL
Immature Retic Fract: 3.5 % (ref 2.3–15.9)
RBC.: 4.05 MIL/uL (ref 3.87–5.11)
Retic Count, Absolute: 49 10*3/uL (ref 19.0–186.0)
Retic Ct Pct: 1.2 % (ref 0.4–3.1)
Reticulocyte Hemoglobin: 31.2 pg (ref >27.9)

## 2024-03-25 LAB — FERRITIN: Ferritin: 43 ng/mL (ref 11–307)

## 2024-03-25 LAB — FOLATE: Folate: 19.2 ng/mL (ref >5.9)

## 2024-03-25 LAB — VITAMIN B12: Vitamin B-12: 324 pg/mL (ref 180–914)

## 2024-03-25 MED ORDER — APIXABAN 2.5 MG PO TABS: 2.5000 mg | ORAL_TABLET | Freq: Two times a day (BID) | ORAL | 1 refills | Status: DC

## 2024-03-25 NOTE — Assessment & Plan Note (Addendum)
Labs are reviewed and discussed with patient.  Continue Eliquis 2.5 mg twice daily.  Stable hemoglobin. 

## 2024-03-25 NOTE — Progress Notes (Signed)
 Hematology/Oncology Progress note [phone] [phone]      Patient Care Team: Dana Eva POUR, PA as PCP - General (Physician Assistant) Babara Call, MD as Consulting Physician (Oncology)  CHIEF COMPLAINTS/REASON FOR VISIT:  Follow up for pulmonary  embolism, anemia.    ASSESSMENT & PLAN:   History of deep vein thrombosis (DVT) of lower extremity Labs are reviewed and discussed with patient.  Continue Eliquis  2.5 mg twice daily.  Stable hemoglobin.  IDA (iron  deficiency anemia) Hemoglobin is slightly decreased, likely due to blood loss form recent surgery Labs are reviewed and discussed with patient. Lab Results  Component Value Date   HGB 11.5 (L) 03/25/2024   TIBC 304 03/25/2024   IRONPCTSAT 24 03/25/2024   FERRITIN 43 03/25/2024    Hemoglobin has improved and is back to baseline.  No iron  deficiency.  Vitamin B 12 deficiency Patient gets monthly B12 injections at primary care provider's office.  B12 is pending.  Orders Placed This Encounter  Procedures   Folate    Standing Status:   Future    Number of Occurrences:   1    Expected Date:   03/25/2024    Expiration Date:   06/23/2024   Iron  and TIBC    Standing Status:   Future    Number of Occurrences:   1    Expected Date:   03/25/2024    Expiration Date:   06/23/2024   Ferritin    Standing Status:   Future    Number of Occurrences:   1    Expected Date:   03/25/2024    Expiration Date:   06/23/2024   Vitamin B12    Standing Status:   Future    Number of Occurrences:   1    Expected Date:   03/25/2024    Expiration Date:   06/23/2024   CBC with Differential/Platelet    Standing Status:   Future    Number of Occurrences:   1    Expected Date:   03/25/2024    Expiration Date:   06/23/2024   Retic Panel    Standing Status:   Future    Number of Occurrences:   1    Expected Date:   03/25/2024    Expiration Date:   06/23/2024   Protein electrophoresis, serum    Standing Status:    Future    Number of Occurrences:   1    Expected Date:   03/25/2024    Expiration Date:   06/23/2024   Lactate dehydrogenase    Standing Status:   Future    Number of Occurrences:   1    Expected Date:   03/25/2024    Expiration Date:   06/23/2024   Hepatic function panel    Standing Status:   Future    Number of Occurrences:   1    Expected Date:   03/25/2024    Expiration Date:   06/23/2024   CBC with Differential (Cancer Center Only)    Standing Status:   Future    Expected Date:   09/24/2024    Expiration Date:   12/23/2024   CMP (Cancer Center only)    Standing Status:   Future    Expected Date:   09/24/2024    Expiration Date:   12/23/2024   Retic Panel    Standing Status:   Future    Expected Date:   09/24/2024    Expiration Date:   12/23/2024   Follow-up  in 6 months. All questions were answered. The patient knows to call the clinic with any problems, questions or concerns.  Dana Cap, MD, PhD [facility] Hematology Oncology 03/25/2024     HISTORY OF PRESENTING ILLNESS:   Dana Bautista is a  78 y.o.  female with PMH listed below was seen in consultation at the request of  Dana Bautista, [name]  for evaluation of pulmonary embolism  Patient was admitted from 10/04/2019-10/05/2019 due to acute bilateral pulmonary embolism and DVT.  Patient denies any immobilization factors prior to the events.  Her initial symptom was sudden onset of left-sided chest pain. 10/04/2019, CT chest angiogram showed acute bilateral lower lobe pulmonary embolism.  No right heart strain.  Small left pleural effusion. 10/05/2019 bilateral lower extremity ultrasound showed right calf posterior calf occlusive DVT.  Very low thrombus burden.  Patient was started on anticoagulation and discharged on Eliquis . She has been on anticoagulation for slightly more than 6 months. She was referred to hematology for evaluation management. Patient reports that the left chest pain has completely resolved.  She was less active  since the diagnosis of pulmonary embolism and has been deconditioned.  She has tried to exercise more and her exercise endurance has improved. Intermittently she experienced left lower extremity swelling. She denies any constitutional symptoms.  July 2021 -switched to Eliquis  2.5mg  BID  INTERVAL HISTORY Dana Bautista is a 78 y.o. female who has above history reviewed by me today presents for follow up visit for management of history of thrombosis.  Patient has been on Eliquis  2.5 mg twice daily.  No bleeding events. No new complains today. She gets monthly B12 injections at her PCP's office.   Review of Systems  Constitutional:  Negative for appetite change, chills, fatigue and fever.  HENT:   Negative for hearing loss and voice change.   Eyes:  Negative for eye problems.  Respiratory:  Negative for chest tightness and cough.   Cardiovascular:  Negative for chest pain.  Gastrointestinal:  Negative for abdominal distention, abdominal pain and blood in stool.  Endocrine: Negative for hot flashes.  Genitourinary:  Negative for difficulty urinating and frequency.   Musculoskeletal:  Negative for arthralgias.  Skin:  Negative for itching and rash.  Neurological:  Negative for extremity weakness.  Hematological:  Negative for adenopathy.  Psychiatric/Behavioral:  Negative for confusion.     MEDICAL HISTORY:  Past Medical History:  Diagnosis Date   Anemia    Arthritis    osteoarthriist   Cancer (HCC)    skin   Carotid stenosis, asymptomatic, bilateral    Cellulitis    Complication of anesthesia    nausea   DVT (deep venous thrombosis) (HCC) 09/2019   Heart murmur    History of methicillin resistant staphylococcus aureus (MRSA) 2020   Mitral valve prolapse    Mitral valve regurgitation    Pericardial effusion    Pleural effusion    PONV (postoperative nausea and vomiting)    Pre-diabetes    Primary osteoarthritis of right knee    Pulmonary embolism (HCC) 2020   Sleep apnea     uses cpap   Tendonitis    outer aspect of right foot   Thyroid  nodule    UTI (lower urinary tract infection)    Vitamin B 12 deficiency     SURGICAL HISTORY: Past Surgical History:  Procedure Laterality Date   ABDOMINAL HYSTERECTOMY     APPENDECTOMY     COLONOSCOPY     FRACTURE SURGERY  left wrist   FRACTURE SURGERY Left    wrist   INCISION AND DRAINAGE Right 06/03/2021   Procedure: INCISION AND DRAINAGE-Right Index Finger;  Surgeon: Edie Norleen PARAS, MD;  Location: [facility];  Service: Orthopedics;  Laterality: Right;   IVC FILTER INSERTION N/A 03/15/2022   Procedure: IVC FILTER INSERTION;  Surgeon: Jama Cordella MATSU, MD;  Location: [facility];  Service: Cardiovascular;  Laterality: N/A;   IVC FILTER INSERTION N/A 07/18/2023   Procedure: IVC FILTER INSERTION;  Surgeon: Jama Cordella MATSU, MD;  Location: [facility];  Service: Cardiovascular;  Laterality: N/A;   IVC FILTER REMOVAL N/A 05/31/2022   Procedure: IVC FILTER REMOVAL;  Surgeon: Jama Cordella MATSU, MD;  Location: [facility];  Service: Cardiovascular;  Laterality: N/A;   IVC FILTER REMOVAL N/A 10/24/2023   Procedure: IVC FILTER REMOVAL;  Surgeon: Jama Cordella MATSU, MD;  Location: [facility];  Service: Cardiovascular;  Laterality: N/A;   KNEE SURGERY Left    debridement   TONSILLECTOMY     TOTAL HIP ARTHROPLASTY Right 03/22/2022   Procedure: TOTAL HIP ARTHROPLASTY;  Surgeon: Edie Norleen PARAS, MD;  Location: [facility];  Service: Orthopedics;  Laterality: Right;   TOTAL KNEE ARTHROPLASTY Right 07/27/2023   Procedure: TOTAL KNEE ARTHROPLASTY;  Surgeon: Edie Norleen PARAS, MD;  Location: [facility];  Service: Orthopedics;  Laterality: Right;    SOCIAL HISTORY: Social History   Socioeconomic History   Marital status: Married    Spouse name: Not on file   Number of children: Not on file   Years of education: Not on file   Highest education level: Not on file  Occupational History   Not on file   Tobacco Use   Smoking status: Never    Passive exposure: Never   Smokeless tobacco: Never  Vaping Use   Vaping status: Never Used  Substance and Sexual Activity   Alcohol use: Yes    Comment: occasionally   Drug use: No   Sexual activity: Not on file  Other Topics Concern   Not on file  Social History Narrative   Lives at home with husband. Still teaches English online .   Social Drivers of Corporate investment banker Strain: Low Risk  (08/23/2023)   Received from [facility]   Overall Financial Resource Strain (CARDIA)    Difficulty of Paying Living Expenses: Not hard at all  Food Insecurity: No Food Insecurity (08/23/2023)   Received from [facility]   Hunger Vital Sign    Within the past 12 months, you worried that your food would run out before you got the money to buy more.: Never true    Within the past 12 months, the food you bought just didn't last and you didn't have money to get more.: Never true  Transportation Needs: No Transportation Needs (08/23/2023)   Received from [facility] - Transportation    In the past 12 months, has lack of transportation kept you from medical appointments or from getting medications?: No    Lack of Transportation (Non-Medical): No  Physical Activity: Not on file  Stress: Not on file  Social Connections: Not on file  Intimate Partner Violence: Not on file    FAMILY HISTORY: Family History  Problem Relation Age of Onset   Breast cancer Mother    Asthma Mother    Lung cancer Father    Heart disease Brother  Heart disease Maternal Grandfather    Throat cancer Maternal Grandfather     ALLERGIES:  is allergic to codeine, elemental sulfur, gabapentin, levaquin [levofloxacin], oxycodone , penicillins, sulfa antibiotics, latex, and sulfur.  MEDICATIONS:  Current Outpatient Medications  Medication Sig Dispense Refill   acetaminophen  (TYLENOL ) 500 MG tablet Take  500-1,000 mg by mouth every 6 (six) hours as needed for mild pain or moderate pain.     Cholecalciferol  (D-3-5) 125 MCG (5000 UT) capsule Take 5,000 Units by mouth daily.     cyanocobalamin  (,VITAMIN B-12,) 1000 MCG/ML injection Inject 1,000 mcg into the muscle every 30 (thirty) days.     methocarbamol (ROBAXIN) 500 MG tablet Take 500 mg by mouth 2 (two) times daily.     Multiple Vitamins-Minerals (MULTIVITAMIN WITH MINERALS) tablet Take 2 tablets by mouth daily. Gummy     Vibegron  (GEMTESA ) 75 MG TABS Take 1 tablet (75 mg total) by mouth daily.     apixaban  (ELIQUIS ) 2.5 MG TABS tablet Take 1 tablet (2.5 mg total) by mouth 2 (two) times daily. Please restart your Eliquis  tomorrow morning 180 tablet 1   No current facility-administered medications for this visit.     PHYSICAL EXAMINATION: ECOG PERFORMANCE STATUS: 1 - Symptomatic but completely ambulatory Vitals:   03/25/24 1338  BP: 95/63  Pulse: 64  Resp: 18  Temp: 98.3 F (36.8 C)   Filed Weights   03/25/24 1338  Weight: 186 lb (84.4 kg)    Physical Exam Constitutional:      General: She is not in acute distress. HENT:     Head: Normocephalic and atraumatic.   Eyes:     General: No scleral icterus.   Cardiovascular:     Rate and Rhythm: Normal rate and regular rhythm.     Heart sounds: Normal heart sounds.  Pulmonary:     Effort: Pulmonary effort is normal. No respiratory distress.     Breath sounds: No wheezing.  Abdominal:     General: Bowel sounds are normal. There is no distension.     Palpations: Abdomen is soft.   Musculoskeletal:        General: No deformity. Normal range of motion.     Cervical back: Normal range of motion and neck supple.     Comments: Trace edema, bilateral lower extremities.  varicose vein bilaterally.    Skin:    General: Skin is warm and dry.     Findings: No erythema or rash.   Neurological:     Mental Status: She is alert and oriented to person, place, and time. Mental status  is at baseline.     Cranial Nerves: No cranial nerve deficit.     Coordination: Coordination normal.   Psychiatric:        Mood and Affect: Mood normal.     LABORATORY DATA:  I have reviewed the data as listed Lab Results  Component Value Date   WBC 6.1 03/25/2024   HGB 11.5 (L) 03/25/2024   HCT 35.7 (L) 03/25/2024   MCV 88.1 03/25/2024   PLT 274 03/25/2024   Recent Labs    07/20/23 1227 08/12/23 1210 10/11/23 0907 02/09/24 0811 03/25/24 1407  NA 135 127* 138 139  --   K 4.1 3.6 4.2 4.7  --   CL 101 95* 105 103  --   CO2 25 20* 25 20  --   GLUCOSE 82 111* 101* 87  --   BUN 16 10 18 15   --   CREATININE 0.59 0.62  0.53 0.73  --   CALCIUM  9.2 9.1 9.0 9.5  --   GFRNONAA >60 >60 >60  --   --   PROT 7.0 7.1 6.4*  --  6.7  ALBUMIN 4.3 4.3 4.0  --  4.1  AST 17 17 15   --  15  ALT 12 13 13   --  12  ALKPHOS 47 72 51  --  53  BILITOT 1.0 1.1 0.8  --  0.9  BILIDIR  --  0.2  --   --  <0.1  IBILI  --  0.9  --   --  NOT CALCULATED   Iron /TIBC/Ferritin/ %Sat    Component Value Date/Time   IRON  74 03/25/2024 1407   TIBC 304 03/25/2024 1407   FERRITIN 43 03/25/2024 1407   IRONPCTSAT 24 03/25/2024 1407    hypercoagulable state work up.  Negative prothrombin gene mutation, Factor V leiden mutation.  Negative anticardiolipin IgM, indeterminate level of anticardiolipin IgG, not meeting antiphospholipid syndrome diagnosis criteria.   RADIOGRAPHIC STUDIES: I have personally reviewed the radiological images as listed and agreed with the findings in the report. No results found.

## 2024-03-25 NOTE — Progress Notes (Signed)
 Pt here for follow up.

## 2024-03-25 NOTE — Assessment & Plan Note (Signed)
 Patient gets monthly B12 injections at primary care provider's office.  B12 is pending.

## 2024-03-25 NOTE — Assessment & Plan Note (Addendum)
 Hemoglobin is slightly decreased, likely due to blood loss form recent surgery Labs are reviewed and discussed with patient. Lab Results  Component Value Date   HGB 11.5 (L) 03/25/2024   TIBC 304 03/25/2024   IRONPCTSAT 24 03/25/2024   FERRITIN 43 03/25/2024    Hemoglobin has improved and is back to baseline.  No iron  deficiency.

## 2024-03-27 LAB — PROTEIN ELECTROPHORESIS, SERUM
A/G Ratio: 1.6 (ref 0.7–1.7)
Albumin ELP: 3.9 g/dL (ref 2.9–4.4)
Alpha-1-Globulin: 0.2 g/dL (ref 0.0–0.4)
Alpha-2-Globulin: 0.7 g/dL (ref 0.4–1.0)
Beta Globulin: 1 g/dL (ref 0.7–1.3)
Gamma Globulin: 0.6 g/dL (ref 0.4–1.8)
Globulin, Total: 2.5 g/dL (ref 2.2–3.9)
Total Protein ELP: 6.4 g/dL (ref 6.0–8.5)

## 2024-04-29 ENCOUNTER — Ambulatory Visit: Admitting: Physician Assistant

## 2024-05-22 ENCOUNTER — Ambulatory Visit: Admitting: Physician Assistant

## 2024-05-23 ENCOUNTER — Ambulatory Visit (INDEPENDENT_AMBULATORY_CARE_PROVIDER_SITE_OTHER): Payer: Medicare Other | Admitting: Vascular Surgery

## 2024-06-04 ENCOUNTER — Encounter: Payer: Self-pay | Admitting: Oncology

## 2024-06-25 ENCOUNTER — Ambulatory Visit: Admitting: Physician Assistant

## 2024-06-27 ENCOUNTER — Ambulatory Visit (INDEPENDENT_AMBULATORY_CARE_PROVIDER_SITE_OTHER): Admitting: Vascular Surgery

## 2024-07-29 ENCOUNTER — Other Ambulatory Visit: Payer: Self-pay

## 2024-07-29 ENCOUNTER — Ambulatory Visit: Payer: Self-pay | Admitting: Physician Assistant

## 2024-07-29 ENCOUNTER — Encounter: Payer: Self-pay | Admitting: Physician Assistant

## 2024-07-29 ENCOUNTER — Ambulatory Visit (INDEPENDENT_AMBULATORY_CARE_PROVIDER_SITE_OTHER): Admitting: Physician Assistant

## 2024-07-29 VITALS — BP 102/50 | HR 68 | Ht 66.0 in | Wt 196.0 lb

## 2024-07-29 DIAGNOSIS — R1024 Suprapubic pain: Secondary | ICD-10-CM | POA: Diagnosis not present

## 2024-07-29 DIAGNOSIS — R3915 Urgency of urination: Secondary | ICD-10-CM

## 2024-07-29 DIAGNOSIS — N3281 Overactive bladder: Secondary | ICD-10-CM

## 2024-07-29 LAB — URINALYSIS, COMPLETE
Bilirubin, UA: NEGATIVE
Glucose, UA: NEGATIVE
Ketones, UA: NEGATIVE
Nitrite, UA: NEGATIVE
Protein,UA: NEGATIVE
RBC, UA: NEGATIVE
Specific Gravity, UA: 1.01 (ref 1.005–1.030)
Urobilinogen, Ur: 0.2 mg/dL (ref 0.2–1.0)
pH, UA: 6.5 (ref 5.0–7.5)

## 2024-07-29 LAB — MICROSCOPIC EXAMINATION

## 2024-07-29 LAB — BLADDER SCAN AMB NON-IMAGING

## 2024-07-29 MED ORDER — GEMTESA 75 MG PO TABS: 75.0000 mg | ORAL_TABLET | Freq: Every day | ORAL | Status: AC

## 2024-07-29 MED ORDER — TOLTERODINE TARTRATE ER 4 MG PO CP24: 4.0000 mg | ORAL_CAPSULE | Freq: Every day | ORAL | 11 refills | Status: DC

## 2024-07-29 MED ORDER — NITROFURANTOIN MONOHYD MACRO 100 MG PO CAPS: 100.0000 mg | ORAL_CAPSULE | Freq: Two times a day (BID) | ORAL | 0 refills | Status: DC

## 2024-07-29 NOTE — Progress Notes (Signed)
 07/29/2024 2:41 PM   Dana Bautista December 14, 1945 [id number]  CC: Chief Complaint  Patient presents with   Other   HPI: Dana Bautista is a 78 y.o. female with PMH OAB on tolterodine , POP, and left pelviectasis who presents today for follow up.   She has Dr. Penne on 03/06/2024 for follow up. Tolterodine  was augmented with Gemtesa  samples at that time.  Today she reports she completed 4 weeks of Gemtesa  samples. She thinks it helped with her voiding symptoms. She describes double voiding, slow stream, and nocturia x4 that interrupts her sleep. She is a sure how much the tolterodine  is helping, however she is planning to travel within the next month and does not want to make any immediate changes to her drug regimen.  She also describes 2 days of suprapubic pain and hesitancy without dysuria.  She wonders if she could be getting a UTI.  She has chronic constipation, which is stable.  PVR 0 mL.  PMH: Past Medical History:  Diagnosis Date   Anemia    Arthritis    osteoarthriist   Cancer (HCC)    skin   Carotid stenosis, asymptomatic, bilateral    Cellulitis    Complication of anesthesia    nausea   DVT (deep venous thrombosis) (HCC) 09/2019   Heart murmur    History of methicillin resistant staphylococcus aureus (MRSA) 2020   Mitral valve prolapse    Mitral valve regurgitation    Pericardial effusion    Pleural effusion    PONV (postoperative nausea and vomiting)    Pre-diabetes    Primary osteoarthritis of right knee    Pulmonary embolism (HCC) 2020   Sleep apnea    uses cpap   Tendonitis    outer aspect of right foot   Thyroid  nodule    UTI (lower urinary tract infection)    Vitamin B 12 deficiency     Surgical History: Past Surgical History:  Procedure Laterality Date   ABDOMINAL HYSTERECTOMY     APPENDECTOMY     COLONOSCOPY     FRACTURE SURGERY     left wrist   FRACTURE SURGERY Left    wrist   INCISION AND DRAINAGE Right 06/03/2021   Procedure: INCISION AND  DRAINAGE-Right Index Finger;  Surgeon: Edie Norleen PARAS, MD;  Location: [facility];  Service: Orthopedics;  Laterality: Right;   IVC FILTER INSERTION N/A 03/15/2022   Procedure: IVC FILTER INSERTION;  Surgeon: Jama Cordella MATSU, MD;  Location: [facility];  Service: Cardiovascular;  Laterality: N/A;   IVC FILTER INSERTION N/A 07/18/2023   Procedure: IVC FILTER INSERTION;  Surgeon: Jama Cordella MATSU, MD;  Location: [facility];  Service: Cardiovascular;  Laterality: N/A;   IVC FILTER REMOVAL N/A 05/31/2022   Procedure: IVC FILTER REMOVAL;  Surgeon: Jama Cordella MATSU, MD;  Location: [facility];  Service: Cardiovascular;  Laterality: N/A;   IVC FILTER REMOVAL N/A 10/24/2023   Procedure: IVC FILTER REMOVAL;  Surgeon: Jama Cordella MATSU, MD;  Location: [facility];  Service: Cardiovascular;  Laterality: N/A;   KNEE SURGERY Left    debridement   TONSILLECTOMY     TOTAL HIP ARTHROPLASTY Right 03/22/2022   Procedure: TOTAL HIP ARTHROPLASTY;  Surgeon: Edie Norleen PARAS, MD;  Location: [facility];  Service: Orthopedics;  Laterality: Right;   TOTAL KNEE ARTHROPLASTY Right 07/27/2023   Procedure: TOTAL KNEE ARTHROPLASTY;  Surgeon: Edie Norleen PARAS, MD;  Location: [facility];  Service: Orthopedics;  Laterality: Right;  Home Medications:  Allergies as of 07/29/2024       Reactions   Codeine Nausea And Vomiting   Elemental Sulfur Other (See Comments)   Lip swelling, difficulty breathing   Gabapentin Nausea And Vomiting   Levaquin [levofloxacin] Hives   Oxycodone  Itching, Nausea Only   Penicillins Hives, Other (See Comments)   Did it involve swelling of the face/tongue/throat, SOB, or low BP? Unknown Did it involve sudden or severe rash/hives, skin peeling, or any reaction on the inside of your mouth or nose? Yes Did you need to seek medical attention at a hospital or doctor's office? Yes When did it last happen? many years If all above answers are "NO", may proceed with  cephalosporin use.   Sulfa Antibiotics Other (See Comments)   Other reaction(s): lip swelling   Latex Rash   Peeling skin   Sulfur Other (See Comments), Swelling   Lip swelling    Lip swelling, difficulty breathing        Medication List        Accurate as of July 29, 2024  2:41 PM. If you have any questions, ask your nurse or doctor.          acetaminophen  500 MG tablet Commonly known as: TYLENOL  Take 500-1,000 mg by mouth every 6 (six) hours as needed for mild pain or moderate pain.   apixaban  2.5 MG Tabs tablet Commonly known as: Eliquis  Take 1 tablet (2.5 mg total) by mouth 2 (two) times daily. Please restart your Eliquis  tomorrow morning   cyanocobalamin  1000 MCG/ML injection Commonly known as: VITAMIN B12 Inject 1,000 mcg into the muscle every 30 (thirty) days.   D-3-5 125 MCG (5000 UT) capsule Generic drug: Cholecalciferol  Take 5,000 Units by mouth daily.   Gemtesa  75 MG Tabs Generic drug: Vibegron  Take 1 tablet (75 mg total) by mouth daily.   methocarbamol 500 MG tablet Commonly known as: ROBAXIN Take 500 mg by mouth 2 (two) times daily.   multivitamin with minerals tablet Take 2 tablets by mouth daily. Gummy   tolterodine  4 MG 24 hr capsule Commonly known as: DETROL  LA Take 1 capsule (4 mg total) by mouth daily.        Allergies:  Allergies  Allergen Reactions   Codeine Nausea And Vomiting   Elemental Sulfur Other (See Comments)    Lip swelling, difficulty breathing   Gabapentin Nausea And Vomiting   Levaquin [Levofloxacin] Hives   Oxycodone  Itching and Nausea Only   Penicillins Hives and Other (See Comments)    Did it involve swelling of the face/tongue/throat, SOB, or low BP? Unknown Did it involve sudden or severe rash/hives, skin peeling, or any reaction on the inside of your mouth or nose? Yes Did you need to seek medical attention at a hospital or doctor's office? Yes When did it last happen? many years If all above answers are  "NO", may proceed with cephalosporin use.    Sulfa Antibiotics Other (See Comments)    Other reaction(s): lip swelling   Latex Rash    Peeling skin   Sulfur Other (See Comments) and Swelling    Lip swelling    Lip swelling, difficulty breathing    Family History: Family History  Problem Relation Age of Onset   Breast cancer Mother    Asthma Mother    Lung cancer Father    Heart disease Brother    Heart disease Maternal Grandfather    Throat cancer Maternal Grandfather     Social History:  reports that she has never smoked. She has never been exposed to tobacco smoke. She has never used smokeless tobacco. She reports current alcohol use. She reports that she does not use drugs.  Physical Exam: BP (!) 102/50   Pulse 68   Ht 5' 6 (1.676 m)   Wt 196 lb (88.9 kg)   BMI 31.64 kg/m   Constitutional:  Alert and oriented, no acute distress, nontoxic appearing HEENT: [place], AT Cardiovascular: No clubbing, cyanosis, or edema Respiratory: Normal respiratory effort, no increased work of breathing Skin: No rashes, bruises or suspicious lesions Neurologic: Grossly intact, no focal deficits, moving all 4 extremities Psychiatric: Normal mood and affect  Laboratory Data: Results for orders placed or performed in visit on 07/29/24  Bladder Scan (Post Void Residual) in office   Collection Time: 07/29/24  9:06 AM  Result Value Ref Range   Scan Result 0ml    Assessment & Plan:   1. OAB (overactive bladder) (Primary) Emptying appropriately.  She thinks Gemtesa  helped, so we will resume this and see her back in 6 weeks with symptom recheck and PVR on dual pharmacotherapy.  I do think a drug holiday would be reasonable to see if the antimuscarinic is actually helping, though we can do this at her convenience.  With her reports of mixed storage and obstructive symptoms, I think she would benefit from seeing Dr. MacDiarmid again for possible urodynamics.  Will discuss this further with her on  follow-up. - Bladder Scan (Post Void Residual) in office - tolterodine  (DETROL  LA) 4 MG 24 hr capsule; Take 1 capsule (4 mg total) by mouth daily.  Dispense: 30 capsule; Refill: 11 - Vibegron  (GEMTESA ) 75 MG TABS; Take 1 tablet (75 mg total) by mouth daily.  2. Suprapubic pain She emptied on arrival today.  Will have her drop off a urine sample later today for UA/culture and reach out to her with results when available. - Urinalysis, Complete - CULTURE, URINE COMPREHENSIVE   Return in about 6 weeks (around 09/09/2024) for Symptom recheck with PVR + will call with UA results.  Lucie Hones, PA-C  [facility] [place] [address] [place], [place] [address] [phone]

## 2024-08-01 LAB — CULTURE, URINE COMPREHENSIVE

## 2024-08-19 ENCOUNTER — Encounter: Payer: Self-pay | Admitting: Oncology

## 2024-08-27 ENCOUNTER — Other Ambulatory Visit: Payer: Self-pay

## 2024-08-27 MED ORDER — APIXABAN 2.5 MG PO TABS: 2.5000 mg | ORAL_TABLET | Freq: Two times a day (BID) | ORAL | 0 refills | Status: DC

## 2024-08-28 ENCOUNTER — Other Ambulatory Visit: Payer: Self-pay

## 2024-08-28 ENCOUNTER — Telehealth: Payer: Self-pay

## 2024-08-28 ENCOUNTER — Encounter: Payer: Self-pay | Admitting: Oncology

## 2024-08-28 MED ORDER — APIXABAN 2.5 MG PO TABS
2.5000 mg | ORAL_TABLET | Freq: Two times a day (BID) | ORAL | 0 refills | Status: DC
  Filled 2024-08-28: qty 60, 30d supply, fill #0

## 2024-08-28 NOTE — Telephone Encounter (Signed)
 [facility] in [place] does not have the Eliquis  in stock.  Called pharmacy to confirm and they do not have in stock but state they can have in tomorrow with closes [facility] has in stock is in [place].  Patient is out of medication for the past 2 days.  Called placed to [facility], they do have in stock.  Patient informed and agrees to have rx sent to [facility].

## 2024-09-09 ENCOUNTER — Encounter: Payer: Self-pay | Admitting: Oncology

## 2024-09-09 ENCOUNTER — Ambulatory Visit (INDEPENDENT_AMBULATORY_CARE_PROVIDER_SITE_OTHER): Admitting: Physician Assistant

## 2024-09-09 VITALS — BP 122/75 | HR 73 | Ht 66.5 in | Wt 186.0 lb

## 2024-09-09 DIAGNOSIS — N3281 Overactive bladder: Secondary | ICD-10-CM

## 2024-09-09 LAB — BLADDER SCAN AMB NON-IMAGING

## 2024-09-09 NOTE — Progress Notes (Signed)
 09/09/2024 9:20 AM   Dana Bautista 28-May-1946 [id number]  CC: Chief Complaint  Patient presents with   Over Active Bladder   HPI: Dana Bautista is a 78 y.o. female with PMH OAB on tolterodine  and Gemtesa , POP, chronic constipation, and left pelviectasis who presents today for follow-up.   Today she reports she remains on tolterodine  and Gemtesa .  She is dry.  She describes bladder pain when she needs to void without dysuria that resolves with voiding.  This is most bothersome for her.  She made some recent dietary changes and with increased vegetable consumption her constipation has improved significantly.  She had some increased urinary frequency while she was out of town and experiencing a lot of stress.  PVR .  PMH: Past Medical History:  Diagnosis Date   Anemia    Arthritis    osteoarthriist   Cancer (HCC)    skin   Carotid stenosis, asymptomatic, bilateral    Cellulitis    Complication of anesthesia    nausea   DVT (deep venous thrombosis) (HCC) 09/2019   Heart murmur    History of methicillin resistant staphylococcus aureus (MRSA) 2020   Mitral valve prolapse    Mitral valve regurgitation    Pericardial effusion    Pleural effusion    PONV (postoperative nausea and vomiting)    Pre-diabetes    Primary osteoarthritis of right knee    Pulmonary embolism (HCC) 2020   Sleep apnea    uses cpap   Tendonitis    outer aspect of right foot   Thyroid  nodule    UTI (lower urinary tract infection)    Vitamin B 12 deficiency     Surgical History: Past Surgical History:  Procedure Laterality Date   ABDOMINAL HYSTERECTOMY     APPENDECTOMY     COLONOSCOPY     FRACTURE SURGERY     left wrist   FRACTURE SURGERY Left    wrist   INCISION AND DRAINAGE Right 06/03/2021   Procedure: INCISION AND DRAINAGE-Right Index Finger;  Surgeon: Edie Norleen PARAS, MD;  Location: [facility];  Service: Orthopedics;  Laterality: Right;   IVC FILTER INSERTION N/A 03/15/2022   Procedure: IVC  FILTER INSERTION;  Surgeon: Jama Cordella MATSU, MD;  Location: [facility];  Service: Cardiovascular;  Laterality: N/A;   IVC FILTER INSERTION N/A 07/18/2023   Procedure: IVC FILTER INSERTION;  Surgeon: Jama Cordella MATSU, MD;  Location: [facility];  Service: Cardiovascular;  Laterality: N/A;   IVC FILTER REMOVAL N/A 05/31/2022   Procedure: IVC FILTER REMOVAL;  Surgeon: Jama Cordella MATSU, MD;  Location: [facility];  Service: Cardiovascular;  Laterality: N/A;   IVC FILTER REMOVAL N/A 10/24/2023   Procedure: IVC FILTER REMOVAL;  Surgeon: Jama Cordella MATSU, MD;  Location: [facility];  Service: Cardiovascular;  Laterality: N/A;   KNEE SURGERY Left    debridement   TONSILLECTOMY     TOTAL HIP ARTHROPLASTY Right 03/22/2022   Procedure: TOTAL HIP ARTHROPLASTY;  Surgeon: Edie Norleen PARAS, MD;  Location: [facility];  Service: Orthopedics;  Laterality: Right;   TOTAL KNEE ARTHROPLASTY Right 07/27/2023   Procedure: TOTAL KNEE ARTHROPLASTY;  Surgeon: Edie Norleen PARAS, MD;  Location: [facility];  Service: Orthopedics;  Laterality: Right;    Home Medications:  Allergies as of 09/09/2024       Reactions   Codeine Nausea And Vomiting   Elemental Sulfur Other (See Comments)   Lip swelling, difficulty breathing   Gabapentin  Nausea And Vomiting   Levaquin [levofloxacin] Hives   Oxycodone  Itching, Nausea Only   Penicillins Hives, Other (See Comments)   Did it involve swelling of the face/tongue/throat, SOB, or low BP? Unknown Did it involve sudden or severe rash/hives, skin peeling, or any reaction on the inside of your mouth or nose? Yes Did you need to seek medical attention at a hospital or doctor's office? Yes When did it last happen? many years If all above answers are NO, may proceed with cephalosporin use.   Sulfa Antibiotics Other (See Comments)   Other reaction(s): lip swelling   Latex Rash   Peeling skin   Sulfur Other (See Comments), Swelling   Lip swelling     Lip swelling, difficulty breathing        Medication List        Accurate as of September 09, 2024  9:20 AM. If you have any questions, ask your nurse or doctor.          acetaminophen  500 MG tablet Commonly known as: TYLENOL  Take 500-1,000 mg by mouth every 6 (six) hours as needed for mild pain or moderate pain.   cyanocobalamin  1000 MCG/ML injection Commonly known as: VITAMIN B12 Inject 1,000 mcg into the muscle every 30 (thirty) days.   D-3-5 125 MCG (5000 UT) capsule Generic drug: Cholecalciferol  Take 5,000 Units by mouth daily.   Eliquis  2.5 MG Tabs tablet Generic drug: apixaban  Take 1 tablet (2.5 mg total) by mouth 2 (two) times daily. Please restart your Eliquis  tomorrow morning   Gemtesa  75 MG Tabs Generic drug: Vibegron  Take 1 tablet (75 mg total) by mouth daily.   methocarbamol 500 MG tablet Commonly known as: ROBAXIN Take 500 mg by mouth 2 (two) times daily.   multivitamin with minerals tablet Take 2 tablets by mouth daily. Gummy   nitrofurantoin  (macrocrystal-monohydrate) 100 MG capsule Commonly known as: MACROBID  Take 1 capsule (100 mg total) by mouth 2 (two) times daily.   tolterodine  4 MG 24 hr capsule Commonly known as: DETROL  LA Take 1 capsule (4 mg total) by mouth daily.        Allergies:  Allergies[1]  Family History: Family History  Problem Relation Age of Onset   Breast cancer Mother    Asthma Mother    Lung cancer Father    Heart disease Brother    Heart disease Maternal Grandfather    Throat cancer Maternal Grandfather     Social History:   reports that she has never smoked. She has never been exposed to tobacco smoke. She has never used smokeless tobacco. She reports current alcohol use. She reports that she does not use drugs.  Physical Exam: BP 122/75   Pulse 73   Ht 5' 6.5 (1.689 m)   Wt 186 lb (84.4 kg)   BMI 29.57 kg/m   Constitutional:  Alert and oriented, no acute distress, nontoxic appearing HEENT: [place],  AT Cardiovascular: No clubbing, cyanosis, or edema Respiratory: Normal respiratory effort, no increased work of breathing Skin: No rashes, bruises or suspicious lesions Neurologic: Grossly intact, no focal deficits, moving all 4 extremities Psychiatric: Normal mood and affect  Laboratory Data: Results for orders placed or performed in visit on 09/09/24  BLADDER SCAN AMB NON-IMAGING   Collection Time: 09/09/24  9:28 AM  Result Value Ref Range   Scan Result    Assessment & Plan:   1. OAB (overactive bladder) (Primary) She continues to have mixed storage and obstructive symptoms on combination pharmacotherapy.  She  is incompletely emptying today despite improvements in her chronic constipation.  I suspect tolterodine  is more likely to be the culprit than Gemtesa  and recommended stopping the tolterodine  and continuing Gemtesa  alone with plans for symptom recheck in 4 weeks.  If she is no better on follow-up, we recommended follow-up with Dr. MacDiarmid or [facility] for second opinion with consideration of urodynamics.  Additionally, with reports of increased frequency associated with stress and bladder pain that resolves with voiding, I do question an element of interstitial cystitis.  We have never seen her for this, though on chart review it looks like she may have been seen at [facility] over 10 years ago with this diagnosis. - BLADDER SCAN AMB NON-IMAGING   Return in about 4 weeks (around 10/07/2024) for Symptom recheck with PVR.  Lucie Hones, PA-C  [facility] [place] [address], [address] [place], [place] [address] [phone]     [1]  Allergies Allergen Reactions   Codeine Nausea And Vomiting   Elemental Sulfur Other (See Comments)    Lip swelling, difficulty breathing   Gabapentin Nausea And Vomiting   Levaquin [Levofloxacin] Hives   Oxycodone  Itching and Nausea Only   Penicillins Hives and Other (See Comments)    Did it involve swelling of the  face/tongue/throat, SOB, or low BP? Unknown Did it involve sudden or severe rash/hives, skin peeling, or any reaction on the inside of your mouth or nose? Yes Did you need to seek medical attention at a hospital or doctor's office? Yes When did it last happen? many years If all above answers are NO, may proceed with cephalosporin use.    Sulfa Antibiotics Other (See Comments)    Other reaction(s): lip swelling   Latex Rash    Peeling skin   Sulfur Other (See Comments) and Swelling    Lip swelling    Lip swelling, difficulty breathing

## 2024-09-24 ENCOUNTER — Inpatient Hospital Stay

## 2024-09-24 ENCOUNTER — Ambulatory Visit: Admitting: Oncology

## 2024-09-24 ENCOUNTER — Inpatient Hospital Stay (HOSPITAL_BASED_OUTPATIENT_CLINIC_OR_DEPARTMENT_OTHER): Admitting: Oncology

## 2024-09-24 ENCOUNTER — Ambulatory Visit: Payer: Self-pay | Admitting: Oncology

## 2024-09-24 ENCOUNTER — Other Ambulatory Visit

## 2024-09-24 ENCOUNTER — Other Ambulatory Visit: Payer: Self-pay

## 2024-09-24 ENCOUNTER — Encounter: Payer: Self-pay | Admitting: Oncology

## 2024-09-24 VITALS — BP 119/52 | HR 73 | Temp 97.8°F | Resp 18 | Wt 188.0 lb

## 2024-09-24 DIAGNOSIS — Z1329 Encounter for screening for other suspected endocrine disorder: Secondary | ICD-10-CM | POA: Diagnosis not present

## 2024-09-24 DIAGNOSIS — Z808 Family history of malignant neoplasm of other organs or systems: Secondary | ICD-10-CM | POA: Insufficient documentation

## 2024-09-24 DIAGNOSIS — Z79899 Other long term (current) drug therapy: Secondary | ICD-10-CM | POA: Diagnosis not present

## 2024-09-24 DIAGNOSIS — Z86718 Personal history of other venous thrombosis and embolism: Secondary | ICD-10-CM | POA: Diagnosis not present

## 2024-09-24 DIAGNOSIS — Z801 Family history of malignant neoplasm of trachea, bronchus and lung: Secondary | ICD-10-CM | POA: Diagnosis not present

## 2024-09-24 DIAGNOSIS — Z86711 Personal history of pulmonary embolism: Secondary | ICD-10-CM | POA: Insufficient documentation

## 2024-09-24 DIAGNOSIS — Z7901 Long term (current) use of anticoagulants: Secondary | ICD-10-CM | POA: Insufficient documentation

## 2024-09-24 DIAGNOSIS — E538 Deficiency of other specified B group vitamins: Secondary | ICD-10-CM

## 2024-09-24 DIAGNOSIS — D509 Iron deficiency anemia, unspecified: Secondary | ICD-10-CM | POA: Diagnosis present

## 2024-09-24 DIAGNOSIS — Z803 Family history of malignant neoplasm of breast: Secondary | ICD-10-CM | POA: Insufficient documentation

## 2024-09-24 DIAGNOSIS — D649 Anemia, unspecified: Secondary | ICD-10-CM | POA: Diagnosis not present

## 2024-09-24 DIAGNOSIS — D508 Other iron deficiency anemias: Secondary | ICD-10-CM | POA: Diagnosis not present

## 2024-09-24 LAB — CMP (CANCER CENTER ONLY)
ALT: 8 U/L (ref 0–44)
AST: 17 U/L (ref 15–41)
Albumin: 4 g/dL (ref 3.5–5.0)
Alkaline Phosphatase: 64 U/L (ref 38–126)
Anion gap: 9 (ref 5–15)
BUN: 11 mg/dL (ref 8–23)
CO2: 27 mmol/L (ref 22–32)
Calcium: 9.5 mg/dL (ref 8.9–10.3)
Chloride: 103 mmol/L (ref 98–111)
Creatinine: 0.59 mg/dL (ref 0.44–1.00)
GFR, Estimated: >60 mL/min
Glucose, Bld: 105 mg/dL — ABNORMAL HIGH (ref 70–99)
Potassium: 4.8 mmol/L (ref 3.5–5.1)
Sodium: 139 mmol/L (ref 135–145)
Total Bilirubin: 0.3 mg/dL (ref 0.0–1.2)
Total Protein: 6.3 g/dL — ABNORMAL LOW (ref 6.5–8.1)

## 2024-09-24 LAB — CBC WITH DIFFERENTIAL (CANCER CENTER ONLY)
Abs Immature Granulocytes: 0.01 K/uL (ref 0.00–0.07)
Basophils Absolute: 0 K/uL (ref 0.0–0.1)
Basophils Relative: 1 %
Eosinophils Absolute: 0.1 K/uL (ref 0.0–0.5)
Eosinophils Relative: 2 %
HCT: 32.1 % — ABNORMAL LOW (ref 36.0–46.0)
Hemoglobin: 10.2 g/dL — ABNORMAL LOW (ref 12.0–15.0)
Immature Granulocytes: 0 %
Lymphocytes Relative: 38 %
Lymphs Abs: 1.9 K/uL (ref 0.7–4.0)
MCH: 28 pg (ref 26.0–34.0)
MCHC: 31.8 g/dL (ref 30.0–36.0)
MCV: 88.2 fL (ref 80.0–100.0)
Monocytes Absolute: 0.6 K/uL (ref 0.1–1.0)
Monocytes Relative: 11 %
Neutro Abs: 2.4 K/uL (ref 1.7–7.7)
Neutrophils Relative %: 48 %
Platelet Count: 261 K/uL (ref 150–400)
RBC: 3.64 MIL/uL — ABNORMAL LOW (ref 3.87–5.11)
RDW: 13.7 % (ref 11.5–15.5)
WBC Count: 5 K/uL (ref 4.0–10.5)
nRBC: 0 % (ref 0.0–0.2)

## 2024-09-24 LAB — IRON AND TIBC
Iron: 52 ug/dL (ref 28–170)
Saturation Ratios: 19 % (ref 10.4–31.8)
TIBC: 277 ug/dL (ref 250–450)
UIBC: 225 ug/dL

## 2024-09-24 LAB — TSH: TSH: 0.587 u[IU]/mL (ref 0.350–4.500)

## 2024-09-24 LAB — RETIC PANEL
Immature Retic Fract: 5.1 % (ref 2.3–15.9)
RBC.: 3.63 MIL/uL — ABNORMAL LOW (ref 3.87–5.11)
Retic Count, Absolute: 42.5 K/uL (ref 19.0–186.0)
Retic Ct Pct: 1.2 % (ref 0.4–3.1)
Reticulocyte Hemoglobin: 31.3 pg

## 2024-09-24 LAB — FERRITIN: Ferritin: 66 ng/mL (ref 11–307)

## 2024-09-24 NOTE — Assessment & Plan Note (Signed)
Labs are reviewed and discussed with patient.  Continue Eliquis 2.5 mg twice daily.  Stable hemoglobin. 

## 2024-09-24 NOTE — Progress Notes (Signed)
 " Hematology/Oncology Progress note [phone] [phone]      Patient Care Team: [name], PA as PCP - General (Physician Assistant) Babara Call, MD as Consulting Physician (Oncology)  CHIEF COMPLAINTS/REASON FOR VISIT:  Follow up for pulmonary  embolism, anemia.    ASSESSMENT & PLAN:   History of deep vein thrombosis (DVT) of lower extremity Labs are reviewed and discussed with patient.  Continue Eliquis  2.5 mg twice daily.  Stable hemoglobin.  IDA (iron  deficiency anemia) Hemoglobin is slightly decreased, likely due to blood loss form recent surgery Labs are reviewed and discussed with patient. Lab Results  Component Value Date   HGB 10.2 (L) 09/24/2024   TIBC 277 09/24/2024   IRONPCTSAT 19 09/24/2024   FERRITIN 66 09/24/2024      Vitamin B 12 deficiency Patient gets monthly B12 injections at primary care provider's office.  B12 is pending.  Normocytic anemia Hemoglobin has decreased despite normal iron  panel. I recommend to check additional blood workup.-Obtain myeloma panel, B12, folate.  Parvovirus,  Orders Placed This Encounter  Procedures   Iron  and TIBC    Standing Status:   Future    Number of Occurrences:   1    Expected Date:   09/24/2024    Expiration Date:   12/23/2024   Ferritin    Standing Status:   Future    Number of Occurrences:   1    Expected Date:   09/24/2024    Expiration Date:   12/23/2024   TSH    Standing Status:   Future    Number of Occurrences:   1    Expected Date:   09/24/2024    Expiration Date:   12/23/2024   Follow-up in 6 months. All questions were answered. The patient knows to call the clinic with any problems, questions or concerns.  Call Babara, MD, PhD [facility] Hematology Oncology 09/24/2024     HISTORY OF PRESENTING ILLNESS:   Dana Bautista is a  78 y.o.  female with PMH listed below was seen in consultation at the request of  [name], [name]  for evaluation of pulmonary  embolism  Patient was admitted from 10/04/2019-10/05/2019 due to acute bilateral pulmonary embolism and DVT.  Patient denies any immobilization factors prior to the events.  Her initial symptom was sudden onset of left-sided chest pain. 10/04/2019, CT chest angiogram showed acute bilateral lower lobe pulmonary embolism.  No right heart strain.  Small left pleural effusion. 10/05/2019 bilateral lower extremity ultrasound showed right calf posterior calf occlusive DVT.  Very low thrombus burden.  Patient was started on anticoagulation and discharged on Eliquis . She has been on anticoagulation for slightly more than 6 months. She was referred to hematology for evaluation management. Patient reports that the left chest pain has completely resolved.  She was less active since the diagnosis of pulmonary embolism and has been deconditioned.  She has tried to exercise more and her exercise endurance has improved. Intermittently she experienced left lower extremity swelling. She denies any constitutional symptoms.  July 2021 -switched to Eliquis  2.5mg  BID  INTERVAL HISTORY Dana Bautista is a 78 y.o. female who has above history reviewed by me today presents for follow up visit for management of history of thrombosis.  Patient has been on Eliquis  2.5 mg twice daily.  No bleeding events. No new complains today. She gets monthly B12 injections at her PCP's office.   Review of Systems  Constitutional:  Negative for appetite change, chills, fatigue  and fever.  HENT:   Negative for hearing loss and voice change.   Eyes:  Negative for eye problems.  Respiratory:  Negative for chest tightness and cough.   Cardiovascular:  Negative for chest pain.  Gastrointestinal:  Negative for abdominal distention, abdominal pain and blood in stool.  Endocrine: Negative for hot flashes.  Genitourinary:  Negative for difficulty urinating and frequency.   Musculoskeletal:  Negative for arthralgias.  Skin:  Negative for itching and  rash.  Neurological:  Negative for extremity weakness.  Hematological:  Negative for adenopathy.  Psychiatric/Behavioral:  Negative for confusion.     MEDICAL HISTORY:  Past Medical History:  Diagnosis Date   Anemia    Arthritis    osteoarthriist   Cancer (HCC)    skin   Carotid stenosis, asymptomatic, bilateral    Cellulitis    Complication of anesthesia    nausea   DVT (deep venous thrombosis) (HCC) 09/2019   Heart murmur    History of methicillin resistant staphylococcus aureus (MRSA) 2020   Mitral valve prolapse    Mitral valve regurgitation    Pericardial effusion    Pleural effusion    PONV (postoperative nausea and vomiting)    Pre-diabetes    Primary osteoarthritis of right knee    Pulmonary embolism (HCC) 2020   Sleep apnea    uses cpap   Tendonitis    outer aspect of right foot   Thyroid  nodule    UTI (lower urinary tract infection)    Vitamin B 12 deficiency     SURGICAL HISTORY: Past Surgical History:  Procedure Laterality Date   ABDOMINAL HYSTERECTOMY     APPENDECTOMY     COLONOSCOPY     FRACTURE SURGERY     left wrist   FRACTURE SURGERY Left    wrist   INCISION AND DRAINAGE Right 06/03/2021   Procedure: INCISION AND DRAINAGE-Right Index Finger;  Surgeon: Edie Norleen PARAS, MD;  Location: [facility];  Service: Orthopedics;  Laterality: Right;   IVC FILTER INSERTION N/A 03/15/2022   Procedure: IVC FILTER INSERTION;  Surgeon: Jama Cordella MATSU, MD;  Location: [facility];  Service: Cardiovascular;  Laterality: N/A;   IVC FILTER INSERTION N/A 07/18/2023   Procedure: IVC FILTER INSERTION;  Surgeon: Jama Cordella MATSU, MD;  Location: [facility];  Service: Cardiovascular;  Laterality: N/A;   IVC FILTER REMOVAL N/A 05/31/2022   Procedure: IVC FILTER REMOVAL;  Surgeon: Jama Cordella MATSU, MD;  Location: [facility];  Service: Cardiovascular;  Laterality: N/A;   IVC FILTER REMOVAL N/A 10/24/2023   Procedure: IVC FILTER REMOVAL;  Surgeon:  Jama Cordella MATSU, MD;  Location: [facility];  Service: Cardiovascular;  Laterality: N/A;   KNEE SURGERY Left    debridement   TONSILLECTOMY     TOTAL HIP ARTHROPLASTY Right 03/22/2022   Procedure: TOTAL HIP ARTHROPLASTY;  Surgeon: Edie Norleen PARAS, MD;  Location: [facility];  Service: Orthopedics;  Laterality: Right;   TOTAL KNEE ARTHROPLASTY Right 07/27/2023   Procedure: TOTAL KNEE ARTHROPLASTY;  Surgeon: Edie Norleen PARAS, MD;  Location: [facility];  Service: Orthopedics;  Laterality: Right;    SOCIAL HISTORY: Social History   Socioeconomic History   Marital status: Married    Spouse name: Not on file   Number of children: Not on file   Years of education: Not on file   Highest education level: Not on file  Occupational History   Not on file  Tobacco Use  Smoking status: Never    Passive exposure: Never   Smokeless tobacco: Never  Vaping Use   Vaping status: Never Used  Substance and Sexual Activity   Alcohol use: Yes    Comment: occasionally   Drug use: No   Sexual activity: Not on file  Other Topics Concern   Not on file  Social History Narrative   Lives at home with husband. Still teaches English online .   Social Drivers of Health   Tobacco Use: Low Risk (09/24/2024)   Patient History    Smoking Tobacco Use: Never    Smokeless Tobacco Use: Never    Passive Exposure: Never  Financial Resource Strain: Low Risk  (09/13/2024)   Received from [facility]   Overall Financial Resource Strain (CARDIA)    Difficulty of Paying Living Expenses: Not hard at all  Food Insecurity: No Food Insecurity (09/13/2024)   Received from [facility]   Epic    Within the past 12 months, you worried that your food would run out before you got the money to buy more.: Never true    Within the past 12 months, the food you bought just didn't last and you didn't have money to get more.: Never true  Transportation Needs: No Transportation Needs  (09/13/2024)   Received from [facility] - Transportation    In the past 12 months, has lack of transportation kept you from medical appointments or from getting medications?: No    Lack of Transportation (Non-Medical): No  Physical Activity: Not on file  Stress: Not on file  Social Connections: Not on file  Intimate Partner Violence: Not on file  Depression (PHQ2-9): Low Risk (03/25/2024)   Depression (PHQ2-9)    PHQ-2 Score: 0  Alcohol Screen: Not on file  Housing: Low Risk  (09/13/2024)   Received from [facility]   Epic    In the last 12 months, was there a time when you were not able to pay the mortgage or rent on time?: No    In the past 12 months, how many times have you moved where you were living?: 0    At any time in the past 12 months, were you homeless or living in a shelter (including now)?: No  Utilities: Not At Risk (09/13/2024)   Received from [facility]   Epic    In the past 12 months has the electric, gas, oil, or water company threatened to shut off services in your home?: No  Health Literacy: Not on file    FAMILY HISTORY: Family History  Problem Relation Age of Onset   Breast cancer Mother    Asthma Mother    Lung cancer Father    Heart disease Brother    Heart disease Maternal Grandfather    Throat cancer Maternal Grandfather     ALLERGIES:  is allergic to codeine, elemental sulfur, gabapentin, levaquin [levofloxacin], oxycodone , penicillins, sulfa antibiotics, latex, and sulfur.  MEDICATIONS:  Current Outpatient Medications  Medication Sig Dispense Refill   acetaminophen  (TYLENOL ) 500 MG tablet Take 500-1,000 mg by mouth every 6 (six) hours as needed for mild pain or moderate pain.     apixaban  (ELIQUIS ) 2.5 MG TABS tablet Take 1 tablet (2.5 mg total) by mouth 2 (two) times daily. Please restart your Eliquis  tomorrow morning 60 tablet 0   Cholecalciferol  (D-3-5) 125 MCG (5000 UT) capsule  Take 5,000 Units by mouth daily.  cyanocobalamin  (,VITAMIN B-12,) 1000 MCG/ML injection Inject 1,000 mcg into the muscle every 30 (thirty) days.     methocarbamol (ROBAXIN) 500 MG tablet Take 500 mg by mouth 2 (two) times daily.     Multiple Vitamins-Minerals (MULTIVITAMIN WITH MINERALS) tablet Take 2 tablets by mouth daily. Gummy     Vibegron  (GEMTESA ) 75 MG TABS Take 1 tablet (75 mg total) by mouth daily.     nitrofurantoin , macrocrystal-monohydrate, (MACROBID ) 100 MG capsule Take 1 capsule (100 mg total) by mouth 2 (two) times daily. (Patient not taking: Reported on 09/24/2024) 10 capsule 0   No current facility-administered medications for this visit.     PHYSICAL EXAMINATION: ECOG PERFORMANCE STATUS: 1 - Symptomatic but completely ambulatory Vitals:   09/24/24 1340  BP: (!) 119/52  Pulse: 73  Resp: 18  Temp: 97.8 F (36.6 C)  SpO2: 100%   Filed Weights   09/24/24 1340  Weight: 188 lb (85.3 kg)    Physical Exam Constitutional:      General: She is not in acute distress. HENT:     Head: Normocephalic and atraumatic.  Eyes:     General: No scleral icterus. Cardiovascular:     Rate and Rhythm: Normal rate and regular rhythm.     Heart sounds: Normal heart sounds.  Pulmonary:     Effort: Pulmonary effort is normal. No respiratory distress.     Breath sounds: No wheezing.  Abdominal:     General: Bowel sounds are normal. There is no distension.     Palpations: Abdomen is soft.  Musculoskeletal:        General: No deformity. Normal range of motion.     Cervical back: Normal range of motion and neck supple.     Comments: Trace edema, bilateral lower extremities.  varicose vein bilaterally.   Skin:    General: Skin is warm and dry.     Findings: No erythema or rash.  Neurological:     Mental Status: She is alert and oriented to person, place, and time. Mental status is at baseline.     Cranial Nerves: No cranial nerve deficit.     Coordination: Coordination  normal.  Psychiatric:        Mood and Affect: Mood normal.     LABORATORY DATA:  I have reviewed the data as listed Lab Results  Component Value Date   WBC 5.0 09/24/2024   HGB 10.2 (L) 09/24/2024   HCT 32.1 (L) 09/24/2024   MCV 88.2 09/24/2024   PLT 261 09/24/2024   Recent Labs    10/11/23 0907 02/09/24 0811 03/25/24 1407 09/24/24 1322  NA 138 139  --  139  K 4.2 4.7  --  4.8  CL 105 103  --  103  CO2 25 20  --  27  GLUCOSE 101* 87  --  105*  BUN 18 15  --  11  CREATININE 0.53 0.73  --  0.59  CALCIUM  9.0 9.5  --  9.5  GFRNONAA >60  --   --  >60  PROT 6.4*  --  6.7 6.3*  ALBUMIN 4.0  --  4.1 4.0  AST 15  --  15 17  ALT 13  --  12 8  ALKPHOS 51  --  53 64  BILITOT 0.8  --  0.9 0.3  BILIDIR  --   --  <0.1  --   IBILI  --   --  NOT CALCULATED  --    Iron /TIBC/Ferritin/ %Sat  Component Value Date/Time   IRON  52 09/24/2024 1322   TIBC 277 09/24/2024 1322   FERRITIN 66 09/24/2024 1322   IRONPCTSAT 19 09/24/2024 1322    hypercoagulable state work up.  Negative prothrombin gene mutation, Factor V leiden mutation.  Negative anticardiolipin IgM, indeterminate level of anticardiolipin IgG, not meeting antiphospholipid syndrome diagnosis criteria.   RADIOGRAPHIC STUDIES: I have personally reviewed the radiological images as listed and agreed with the findings in the report. No results found.  "

## 2024-09-24 NOTE — Assessment & Plan Note (Addendum)
 Hemoglobin is slightly decreased, likely due to blood loss form recent surgery Labs are reviewed and discussed with patient. Lab Results  Component Value Date   HGB 10.2 (L) 09/24/2024   TIBC 277 09/24/2024   IRONPCTSAT 19 09/24/2024   FERRITIN 66 09/24/2024

## 2024-09-24 NOTE — Assessment & Plan Note (Addendum)
 Hemoglobin has decreased despite normal iron  panel. I recommend to check additional blood workup.-Obtain myeloma panel, B12, folate.  Parvovirus,

## 2024-09-24 NOTE — Assessment & Plan Note (Signed)
 Patient gets monthly B12 injections at primary care provider's office.  B12 is pending.

## 2024-09-27 NOTE — Progress Notes (Signed)
 Friday 1/9 @ 8:30am

## 2024-09-30 ENCOUNTER — Other Ambulatory Visit: Payer: Self-pay

## 2024-09-30 MED ORDER — APIXABAN 2.5 MG PO TABS
2.5000 mg | ORAL_TABLET | Freq: Two times a day (BID) | ORAL | 0 refills | Status: DC
  Filled 2024-09-30: qty 60, 30d supply, fill #0

## 2024-10-04 ENCOUNTER — Inpatient Hospital Stay: Attending: Oncology

## 2024-10-04 DIAGNOSIS — D509 Iron deficiency anemia, unspecified: Secondary | ICD-10-CM | POA: Diagnosis present

## 2024-10-04 DIAGNOSIS — D649 Anemia, unspecified: Secondary | ICD-10-CM

## 2024-10-04 DIAGNOSIS — E538 Deficiency of other specified B group vitamins: Secondary | ICD-10-CM | POA: Diagnosis not present

## 2024-10-04 DIAGNOSIS — Z86718 Personal history of other venous thrombosis and embolism: Secondary | ICD-10-CM | POA: Insufficient documentation

## 2024-10-04 DIAGNOSIS — Z7901 Long term (current) use of anticoagulants: Secondary | ICD-10-CM | POA: Diagnosis not present

## 2024-10-04 LAB — CBC WITH DIFFERENTIAL/PLATELET
Abs Immature Granulocytes: 0.02 K/uL (ref 0.00–0.07)
Basophils Absolute: 0 K/uL (ref 0.0–0.1)
Basophils Relative: 0 %
Eosinophils Absolute: 0.1 K/uL (ref 0.0–0.5)
Eosinophils Relative: 2 %
HCT: 34.2 % — ABNORMAL LOW (ref 36.0–46.0)
Hemoglobin: 10.9 g/dL — ABNORMAL LOW (ref 12.0–15.0)
Immature Granulocytes: 0 %
Lymphocytes Relative: 28 %
Lymphs Abs: 1.5 K/uL (ref 0.7–4.0)
MCH: 28.1 pg (ref 26.0–34.0)
MCHC: 31.9 g/dL (ref 30.0–36.0)
MCV: 88.1 fL (ref 80.0–100.0)
Monocytes Absolute: 0.4 K/uL (ref 0.1–1.0)
Monocytes Relative: 8 %
Neutro Abs: 3.2 K/uL (ref 1.7–7.7)
Neutrophils Relative %: 62 %
Platelets: 285 K/uL (ref 150–400)
RBC: 3.88 MIL/uL (ref 3.87–5.11)
RDW: 13.5 % (ref 11.5–15.5)
WBC: 5.2 K/uL (ref 4.0–10.5)
nRBC: 0 % (ref 0.0–0.2)

## 2024-10-04 LAB — LACTATE DEHYDROGENASE: LDH: 141 U/L (ref 105–235)

## 2024-10-04 LAB — FOLATE: Folate: 6.6 ng/mL

## 2024-10-04 LAB — VITAMIN B12: Vitamin B-12: 378 pg/mL (ref 180–914)

## 2024-10-05 LAB — HAPTOGLOBIN: Haptoglobin: 208 mg/dL (ref 42–346)

## 2024-10-07 LAB — KAPPA/LAMBDA LIGHT CHAINS
Kappa free light chain: 15 mg/L (ref 3.3–19.4)
Kappa, lambda light chain ratio: 1 (ref 0.26–1.65)
Lambda free light chains: 15 mg/L (ref 5.7–26.3)

## 2024-10-08 LAB — MULTIPLE MYELOMA PANEL, SERUM
Albumin SerPl Elph-Mcnc: 3.8 g/dL (ref 2.9–4.4)
Albumin/Glob SerPl: 1.6 (ref 0.7–1.7)
Alpha 1: 0.2 g/dL (ref 0.0–0.4)
Alpha2 Glob SerPl Elph-Mcnc: 0.8 g/dL (ref 0.4–1.0)
B-Globulin SerPl Elph-Mcnc: 1.1 g/dL (ref 0.7–1.3)
Gamma Glob SerPl Elph-Mcnc: 0.5 g/dL (ref 0.4–1.8)
Globulin, Total: 2.5 g/dL (ref 2.2–3.9)
IgA: 256 mg/dL (ref 64–422)
IgG (Immunoglobin G), Serum: 610 mg/dL (ref 586–1602)
IgM (Immunoglobulin M), Srm: 56 mg/dL (ref 26–217)
Total Protein ELP: 6.3 g/dL (ref 6.0–8.5)

## 2024-10-14 ENCOUNTER — Other Ambulatory Visit: Payer: Self-pay

## 2024-10-14 ENCOUNTER — Encounter: Payer: Self-pay | Admitting: Physician Assistant

## 2024-10-14 ENCOUNTER — Encounter: Payer: Self-pay | Admitting: Oncology

## 2024-10-14 ENCOUNTER — Telehealth: Payer: Self-pay

## 2024-10-14 ENCOUNTER — Ambulatory Visit: Admitting: Physician Assistant

## 2024-10-14 VITALS — BP 99/63 | HR 68 | Ht 66.5 in | Wt 189.0 lb

## 2024-10-14 DIAGNOSIS — N3281 Overactive bladder: Secondary | ICD-10-CM

## 2024-10-14 DIAGNOSIS — N3010 Interstitial cystitis (chronic) without hematuria: Secondary | ICD-10-CM

## 2024-10-14 LAB — BLADDER SCAN AMB NON-IMAGING

## 2024-10-14 MED ORDER — APIXABAN 2.5 MG PO TABS: 2.5000 mg | ORAL_TABLET | Freq: Two times a day (BID) | ORAL | 1 refills | Status: AC

## 2024-10-14 NOTE — Telephone Encounter (Signed)
 Called patient and got patient scheduled for PTNS

## 2024-10-14 NOTE — Progress Notes (Unsigned)
 "  10/14/2024 9:36 AM   Dana Bautista 10/09/1945 [id number]  CC: Chief Complaint  Patient presents with   Over Active Bladder   HPI: Dana Bautista is a 79 y.o. female with PMH OAB who developed incomplete emptying on tolterodine  and Gemtesa , POP, chronic constipation, left pelviectasis, and possible interstitial cystitis treated at [facility] who presents today for symptom recheck off tolterodine .   Today she reports her hesitancy and straining have resolved since stopping tolterodine  and she feels that she is emptying her bladder better.  She still has some daytime frequency, typically every 1-2 hours.  She describes nocturia x 1-2.  She confirms that she may have been treated for IC at [facility] in the remote past, but she does not remember this very well due to the duration since treatment.  She does not recall having received any particular treatment that worked well for her.  She continues to experience some bladder burning discomfort that resolves with voiding and she does admit to some increased stress lately.  PVR 67mL.  PMH: Past Medical History:  Diagnosis Date   Anemia    Arthritis    osteoarthriist   Cancer (HCC)    skin   Carotid stenosis, asymptomatic, bilateral    Cellulitis    Complication of anesthesia    nausea   DVT (deep venous thrombosis) (HCC) 09/2019   Heart murmur    History of methicillin resistant staphylococcus aureus (MRSA) 2020   Mitral valve prolapse    Mitral valve regurgitation    Pericardial effusion    Pleural effusion    PONV (postoperative nausea and vomiting)    Pre-diabetes    Primary osteoarthritis of right knee    Pulmonary embolism (HCC) 2020   Sleep apnea    uses cpap   Tendonitis    outer aspect of right foot   Thyroid  nodule    UTI (lower urinary tract infection)    Vitamin B 12 deficiency     Surgical History: Past Surgical History:  Procedure Laterality Date   ABDOMINAL HYSTERECTOMY     APPENDECTOMY     COLONOSCOPY     FRACTURE  SURGERY     left wrist   FRACTURE SURGERY Left    wrist   INCISION AND DRAINAGE Right 06/03/2021   Procedure: INCISION AND DRAINAGE-Right Index Finger;  Surgeon: Edie Norleen PARAS, MD;  Location: [facility] ORS;  Service: Orthopedics;  Laterality: Right;   IVC FILTER INSERTION N/A 03/15/2022   Procedure: IVC FILTER INSERTION;  Surgeon: Jama Cordella MATSU, MD;  Location: [facility];  Service: Cardiovascular;  Laterality: N/A;   IVC FILTER INSERTION N/A 07/18/2023   Procedure: IVC FILTER INSERTION;  Surgeon: Jama Cordella MATSU, MD;  Location: [facility];  Service: Cardiovascular;  Laterality: N/A;   IVC FILTER REMOVAL N/A 05/31/2022   Procedure: IVC FILTER REMOVAL;  Surgeon: Jama Cordella MATSU, MD;  Location: [facility];  Service: Cardiovascular;  Laterality: N/A;   IVC FILTER REMOVAL N/A 10/24/2023   Procedure: IVC FILTER REMOVAL;  Surgeon: Jama Cordella MATSU, MD;  Location: [facility];  Service: Cardiovascular;  Laterality: N/A;   KNEE SURGERY Left    debridement   TONSILLECTOMY     TOTAL HIP ARTHROPLASTY Right 03/22/2022   Procedure: TOTAL HIP ARTHROPLASTY;  Surgeon: Edie Norleen PARAS, MD;  Location: [facility];  Service: Orthopedics;  Laterality: Right;   TOTAL KNEE ARTHROPLASTY Right 07/27/2023   Procedure: TOTAL KNEE ARTHROPLASTY;  Surgeon: Edie Norleen PARAS, MD;  Location: [facility];  Service: Orthopedics;  Laterality: Right;    Home Medications:  Allergies as of 10/14/2024       Reactions   Codeine Nausea And Vomiting   Elemental Sulfur Other (See Comments)   Lip swelling, difficulty breathing   Gabapentin Nausea And Vomiting   Levaquin [levofloxacin] Hives   Oxycodone  Itching, Nausea Only   Penicillins Hives, Other (See Comments)   Did it involve swelling of the face/tongue/throat, SOB, or low BP? Unknown Did it involve sudden or severe rash/hives, skin peeling, or any reaction on the inside of your mouth or nose? Yes Did you need to seek medical attention at a  hospital or doctor's office? Yes When did it last happen? many years If all above answers are NO, may proceed with cephalosporin use.   Sulfa Antibiotics Other (See Comments)   Other reaction(s): lip swelling   Latex Rash   Peeling skin   Sulfur Other (See Comments), Swelling   Lip swelling    Lip swelling, difficulty breathing        Medication List        Accurate as of October 14, 2024  9:36 AM. If you have any questions, ask your nurse or doctor.          STOP taking these medications    nitrofurantoin  (macrocrystal-monohydrate) 100 MG capsule Commonly known as: MACROBID  Stopped by: Lucie Hones       TAKE these medications    acetaminophen  500 MG tablet Commonly known as: TYLENOL  Take 500-1,000 mg by mouth every 6 (six) hours as needed for mild pain or moderate pain.   cyanocobalamin  1000 MCG/ML injection Commonly known as: VITAMIN B12 Inject 1,000 mcg into the muscle every 30 (thirty) days.   D-3-5 125 MCG (5000 UT) capsule Generic drug: Cholecalciferol  Take 5,000 Units by mouth daily.   Eliquis  2.5 MG Tabs tablet Generic drug: apixaban  Take 1 tablet (2.5 mg total) by mouth 2 (two) times daily. Please restart your Eliquis  tomorrow morning   Gemtesa  75 MG Tabs Generic drug: Vibegron  Take 1 tablet (75 mg total) by mouth daily.   lidocaine  5 % Commonly known as: LIDODERM  1 patch daily.   methocarbamol 500 MG tablet Commonly known as: ROBAXIN Take 500 mg by mouth 2 (two) times daily.   multivitamin with minerals tablet Take 2 tablets by mouth daily. Gummy   predniSONE 10 MG tablet Commonly known as: DELTASONE 6 day taper - Take as directed        Allergies:  Allergies[1]  Family History: Family History  Problem Relation Age of Onset   Breast cancer Mother    Asthma Mother    Lung cancer Father    Heart disease Brother    Heart disease Maternal Grandfather    Throat cancer Maternal Grandfather     Social History:    reports that she has never smoked. She has never been exposed to tobacco smoke. She has never used smokeless tobacco. She reports current alcohol use. She reports that she does not use drugs.  Physical Exam: There were no vitals taken for this visit.  Constitutional:  Alert and oriented, no acute distress, nontoxic appearing HEENT: [place], AT Cardiovascular: No clubbing, cyanosis, or edema Respiratory: Normal respiratory effort, no increased work of breathing Skin: No rashes, bruises or suspicious lesions Neurologic: Grossly intact, no focal deficits, moving all 4 extremities Psychiatric: Normal mood and affect  Laboratory Data: Results for orders placed or performed in visit on 10/14/24  Bladder Scan (Post  Void Residual) in office   Collection Time: 10/14/24  9:43 AM  Result Value Ref Range   Scan Result 67ml    Assessment & Plan:   1. OAB (overactive bladder) (Primary) Obstructive voiding symptoms have resolved off tolterodine .  Will continue Gemtesa .  We discussed augmenting with PTNS and she would like to try this.  We will call her to schedule. - Bladder Scan (Post Void Residual) in office  2. Interstitial cystitis Episodic bladder burning that resolves with voiding consistent with her history of IC.  Will defer additional therapy pending PTNS above.  May consider bladder rescue or amitriptyline if this becomes more bothersome to me: Would defer antihistamines in the absence of seasonal allergies.  Return for Will call to schedule PTNS.  Lucie Hones, PA-C  [facility] [place] [address] [place], [place] [address] [phone]      [1]  Allergies Allergen Reactions   Codeine Nausea And Vomiting   Elemental Sulfur Other (See Comments)    Lip swelling, difficulty breathing   Gabapentin Nausea And Vomiting   Levaquin [Levofloxacin] Hives   Oxycodone  Itching and Nausea Only   Penicillins Hives and Other (See Comments)    Did it  involve swelling of the face/tongue/throat, SOB, or low BP? Unknown Did it involve sudden or severe rash/hives, skin peeling, or any reaction on the inside of your mouth or nose? Yes Did you need to seek medical attention at a hospital or doctor's office? Yes When did it last happen? many years If all above answers are NO, may proceed with cephalosporin use.    Sulfa Antibiotics Other (See Comments)    Other reaction(s): lip swelling   Latex Rash    Peeling skin   Sulfur Other (See Comments) and Swelling    Lip swelling    Lip swelling, difficulty breathing   "

## 2024-10-14 NOTE — Telephone Encounter (Signed)
-----   Message from [facility] Nya B sent at 10/14/2024 10:16 AM EST ----- Regarding: Schedule PTNS Good morning, this patient will need PTNS scheduled at your convince. This is a sam patient. Thank you!

## 2024-10-15 ENCOUNTER — Encounter: Payer: Self-pay | Admitting: Oncology

## 2024-10-22 ENCOUNTER — Encounter: Payer: Self-pay | Admitting: Oncology

## 2024-10-23 ENCOUNTER — Other Ambulatory Visit: Payer: Self-pay

## 2024-10-23 DIAGNOSIS — D508 Other iron deficiency anemias: Secondary | ICD-10-CM

## 2024-10-28 ENCOUNTER — Encounter: Payer: Self-pay | Admitting: Oncology

## 2024-10-29 ENCOUNTER — Ambulatory Visit: Admitting: Urology

## 2024-11-05 ENCOUNTER — Ambulatory Visit

## 2024-11-12 ENCOUNTER — Ambulatory Visit

## 2024-11-19 ENCOUNTER — Ambulatory Visit: Admitting: Urology

## 2024-11-20 ENCOUNTER — Ambulatory Visit: Admitting: Physician Assistant

## 2024-11-26 ENCOUNTER — Ambulatory Visit

## 2024-11-27 ENCOUNTER — Ambulatory Visit: Admitting: Physician Assistant

## 2024-12-03 ENCOUNTER — Ambulatory Visit

## 2024-12-04 ENCOUNTER — Ambulatory Visit: Admitting: Physician Assistant

## 2024-12-10 ENCOUNTER — Ambulatory Visit

## 2024-12-11 ENCOUNTER — Ambulatory Visit

## 2024-12-17 ENCOUNTER — Ambulatory Visit

## 2024-12-18 ENCOUNTER — Ambulatory Visit: Admitting: Physician Assistant

## 2024-12-24 ENCOUNTER — Ambulatory Visit

## 2024-12-25 ENCOUNTER — Ambulatory Visit

## 2024-12-31 ENCOUNTER — Ambulatory Visit

## 2025-01-01 ENCOUNTER — Ambulatory Visit: Admitting: Physician Assistant

## 2025-01-07 ENCOUNTER — Ambulatory Visit

## 2025-01-08 ENCOUNTER — Ambulatory Visit: Admitting: Physician Assistant

## 2025-01-14 ENCOUNTER — Ambulatory Visit

## 2025-01-15 ENCOUNTER — Ambulatory Visit: Admitting: Physician Assistant

## 2025-01-21 ENCOUNTER — Inpatient Hospital Stay

## 2025-01-22 ENCOUNTER — Ambulatory Visit: Admitting: Physician Assistant

## 2025-01-28 ENCOUNTER — Inpatient Hospital Stay: Admitting: Oncology

## 2025-01-29 ENCOUNTER — Ambulatory Visit: Admitting: Physician Assistant

## 2025-02-05 ENCOUNTER — Ambulatory Visit: Admitting: Physician Assistant
